# Patient Record
Sex: Female | Born: 1945 | Race: White | Hispanic: No | Smoking: Never smoker
Health system: Southern US, Community
[De-identification: ages and names within clinical notes are randomized; demographics above are authoritative.]

## PROBLEM LIST (undated history)

## (undated) DIAGNOSIS — R42 Dizziness and giddiness: Secondary | ICD-10-CM

## (undated) DIAGNOSIS — I2510 Atherosclerotic heart disease of native coronary artery without angina pectoris: Secondary | ICD-10-CM

## (undated) DIAGNOSIS — I10 Essential (primary) hypertension: Secondary | ICD-10-CM

## (undated) DIAGNOSIS — M1990 Unspecified osteoarthritis, unspecified site: Secondary | ICD-10-CM

## (undated) DIAGNOSIS — Z8619 Personal history of other infectious and parasitic diseases: Secondary | ICD-10-CM

## (undated) DIAGNOSIS — I2699 Other pulmonary embolism without acute cor pulmonale: Secondary | ICD-10-CM

## (undated) DIAGNOSIS — Z86718 Personal history of other venous thrombosis and embolism: Secondary | ICD-10-CM

## (undated) DIAGNOSIS — N809 Endometriosis, unspecified: Secondary | ICD-10-CM

## (undated) DIAGNOSIS — Z87442 Personal history of urinary calculi: Secondary | ICD-10-CM

## (undated) DIAGNOSIS — K5730 Diverticulosis of large intestine without perforation or abscess without bleeding: Secondary | ICD-10-CM

## (undated) DIAGNOSIS — T883XXA Malignant hyperthermia due to anesthesia, initial encounter: Secondary | ICD-10-CM

## (undated) DIAGNOSIS — R55 Syncope and collapse: Secondary | ICD-10-CM

## (undated) DIAGNOSIS — Q249 Congenital malformation of heart, unspecified: Secondary | ICD-10-CM

## (undated) DIAGNOSIS — I219 Acute myocardial infarction, unspecified: Secondary | ICD-10-CM

## (undated) DIAGNOSIS — E7800 Pure hypercholesterolemia, unspecified: Secondary | ICD-10-CM

## (undated) DIAGNOSIS — D6852 Prothrombin gene mutation: Secondary | ICD-10-CM

## (undated) DIAGNOSIS — N200 Calculus of kidney: Secondary | ICD-10-CM

## (undated) DIAGNOSIS — D649 Anemia, unspecified: Secondary | ICD-10-CM

## (undated) DIAGNOSIS — H269 Unspecified cataract: Secondary | ICD-10-CM

## (undated) HISTORY — PX: ABDOMINAL HYSTERECTOMY: SHX81

## (undated) HISTORY — PX: FOOT SURGERY: SHX648

## (undated) HISTORY — DX: Prothrombin gene mutation: D68.52

## (undated) HISTORY — DX: Unspecified cataract: H26.9

## (undated) HISTORY — DX: Calculus of kidney: N20.0

## (undated) HISTORY — PX: APPENDECTOMY: SHX54

## (undated) HISTORY — DX: Personal history of other venous thrombosis and embolism: Z86.718

## (undated) HISTORY — PX: CORONARY ANGIOPLASTY WITH STENT PLACEMENT: SHX49

## (undated) HISTORY — PX: CHOLECYSTECTOMY: SHX55

## (undated) HISTORY — DX: Endometriosis, unspecified: N80.9

---
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
## 1988-04-28 DIAGNOSIS — R42 Dizziness and giddiness: Secondary | ICD-10-CM

## 1988-04-28 HISTORY — DX: Dizziness and giddiness: R42

## 2003-04-29 HISTORY — PX: CARDIAC CATHETERIZATION: SHX172

## 2004-03-07 ENCOUNTER — Ambulatory Visit: Payer: Self-pay | Admitting: Gastroenterology

## 2004-12-18 ENCOUNTER — Ambulatory Visit: Payer: Self-pay | Admitting: Obstetrics and Gynecology

## 2005-02-26 ENCOUNTER — Ambulatory Visit: Payer: Self-pay | Admitting: General Practice

## 2005-07-11 ENCOUNTER — Observation Stay: Payer: Self-pay | Admitting: Internal Medicine

## 2005-07-11 ENCOUNTER — Other Ambulatory Visit: Payer: Self-pay

## 2005-12-22 ENCOUNTER — Ambulatory Visit: Payer: Self-pay | Admitting: Obstetrics and Gynecology

## 2006-12-24 ENCOUNTER — Ambulatory Visit: Payer: Self-pay | Admitting: Obstetrics and Gynecology

## 2006-12-30 ENCOUNTER — Other Ambulatory Visit: Payer: Self-pay

## 2006-12-30 ENCOUNTER — Ambulatory Visit: Payer: Self-pay

## 2007-12-27 ENCOUNTER — Ambulatory Visit: Payer: Self-pay | Admitting: Obstetrics and Gynecology

## 2008-08-23 ENCOUNTER — Ambulatory Visit: Payer: Self-pay | Admitting: Urology

## 2008-12-28 ENCOUNTER — Ambulatory Visit: Payer: Self-pay | Admitting: Obstetrics and Gynecology

## 2009-10-24 ENCOUNTER — Ambulatory Visit: Payer: Self-pay | Admitting: Orthopedic Surgery

## 2010-01-11 ENCOUNTER — Ambulatory Visit: Payer: Self-pay | Admitting: Obstetrics and Gynecology

## 2011-01-16 ENCOUNTER — Ambulatory Visit: Payer: Self-pay | Admitting: Obstetrics and Gynecology

## 2011-01-30 ENCOUNTER — Emergency Department: Payer: Self-pay | Admitting: Internal Medicine

## 2012-02-04 ENCOUNTER — Ambulatory Visit: Payer: Self-pay | Admitting: Obstetrics and Gynecology

## 2012-02-05 ENCOUNTER — Ambulatory Visit: Payer: Self-pay | Admitting: Obstetrics and Gynecology

## 2013-02-07 ENCOUNTER — Ambulatory Visit: Payer: Self-pay | Admitting: Obstetrics and Gynecology

## 2013-07-21 DIAGNOSIS — M722 Plantar fascial fibromatosis: Secondary | ICD-10-CM | POA: Insufficient documentation

## 2013-07-21 DIAGNOSIS — M67919 Unspecified disorder of synovium and tendon, unspecified shoulder: Secondary | ICD-10-CM | POA: Insufficient documentation

## 2013-07-21 DIAGNOSIS — E7800 Pure hypercholesterolemia, unspecified: Secondary | ICD-10-CM | POA: Insufficient documentation

## 2013-07-21 DIAGNOSIS — I10 Essential (primary) hypertension: Secondary | ICD-10-CM | POA: Insufficient documentation

## 2013-07-21 DIAGNOSIS — M75120 Complete rotator cuff tear or rupture of unspecified shoulder, not specified as traumatic: Secondary | ICD-10-CM | POA: Insufficient documentation

## 2013-07-21 DIAGNOSIS — M76899 Other specified enthesopathies of unspecified lower limb, excluding foot: Secondary | ICD-10-CM | POA: Insufficient documentation

## 2013-12-22 DIAGNOSIS — I2510 Atherosclerotic heart disease of native coronary artery without angina pectoris: Secondary | ICD-10-CM | POA: Insufficient documentation

## 2014-02-23 ENCOUNTER — Ambulatory Visit: Payer: Self-pay | Admitting: Obstetrics and Gynecology

## 2014-03-06 ENCOUNTER — Ambulatory Visit: Payer: Self-pay | Admitting: Gastroenterology

## 2014-04-28 DIAGNOSIS — I2699 Other pulmonary embolism without acute cor pulmonale: Secondary | ICD-10-CM

## 2014-04-28 HISTORY — DX: Other pulmonary embolism without acute cor pulmonale: I26.99

## 2014-07-03 DIAGNOSIS — M129 Arthropathy, unspecified: Secondary | ICD-10-CM | POA: Insufficient documentation

## 2015-01-30 ENCOUNTER — Other Ambulatory Visit: Payer: Self-pay | Admitting: Obstetrics and Gynecology

## 2015-01-30 DIAGNOSIS — Z1231 Encounter for screening mammogram for malignant neoplasm of breast: Secondary | ICD-10-CM

## 2015-03-01 ENCOUNTER — Ambulatory Visit
Admission: RE | Admit: 2015-03-01 | Discharge: 2015-03-01 | Disposition: A | Discharge status: Home / Self Care | Payer: Medicare Other | Source: Ambulatory Visit | Attending: Obstetrics and Gynecology | Admitting: Obstetrics and Gynecology

## 2015-03-01 ENCOUNTER — Other Ambulatory Visit: Payer: Self-pay | Admitting: Obstetrics and Gynecology

## 2015-03-01 DIAGNOSIS — Z1231 Encounter for screening mammogram for malignant neoplasm of breast: Secondary | ICD-10-CM

## 2015-04-11 ENCOUNTER — Emergency Department
Admission: EM | Admit: 2015-04-11 | Discharge: 2015-04-11 | Disposition: A | Discharge status: Home / Self Care | Payer: Medicare Other | Attending: Emergency Medicine | Admitting: Emergency Medicine

## 2015-04-11 DIAGNOSIS — N3090 Cystitis, unspecified without hematuria: Secondary | ICD-10-CM | POA: Insufficient documentation

## 2015-04-11 DIAGNOSIS — R112 Nausea with vomiting, unspecified: Secondary | ICD-10-CM | POA: Diagnosis present

## 2015-04-11 DIAGNOSIS — K529 Noninfective gastroenteritis and colitis, unspecified: Secondary | ICD-10-CM | POA: Diagnosis not present

## 2015-04-11 HISTORY — DX: Congenital malformation of heart, unspecified: Q24.9

## 2015-04-11 HISTORY — DX: Diverticulosis of large intestine without perforation or abscess without bleeding: K57.30

## 2015-04-11 LAB — URINALYSIS COMPLETE WITH MICROSCOPIC (ARMC ONLY)
Bilirubin Urine: NEGATIVE
GLUCOSE, UA: NEGATIVE mg/dL
KETONES UR: NEGATIVE mg/dL
NITRITE: NEGATIVE
Protein, ur: 100 mg/dL — AB
SPECIFIC GRAVITY, URINE: 1.013 (ref 1.005–1.030)
pH: 6 (ref 5.0–8.0)

## 2015-04-11 LAB — COMPREHENSIVE METABOLIC PANEL
ALK PHOS: 72 U/L (ref 38–126)
ALT: 20 U/L (ref 14–54)
ANION GAP: 13 (ref 5–15)
AST: 22 U/L (ref 15–41)
Albumin: 3.9 g/dL (ref 3.5–5.0)
BILIRUBIN TOTAL: 1.1 mg/dL (ref 0.3–1.2)
BUN: 24 mg/dL — ABNORMAL HIGH (ref 6–20)
CALCIUM: 9.3 mg/dL (ref 8.9–10.3)
CO2: 23 mmol/L (ref 22–32)
Chloride: 99 mmol/L — ABNORMAL LOW (ref 101–111)
Creatinine, Ser: 1.31 mg/dL — ABNORMAL HIGH (ref 0.44–1.00)
GFR calc Af Amer: 47 mL/min — ABNORMAL LOW (ref >60)
GFR, EST NON AFRICAN AMERICAN: 40 mL/min — AB (ref >60)
Glucose, Bld: 142 mg/dL — ABNORMAL HIGH (ref 65–99)
POTASSIUM: 3.1 mmol/L — AB (ref 3.5–5.1)
Sodium: 135 mmol/L (ref 135–145)
TOTAL PROTEIN: 8.4 g/dL — AB (ref 6.5–8.1)

## 2015-04-11 LAB — CBC
HEMATOCRIT: 38.3 % (ref 35.0–47.0)
HEMOGLOBIN: 12.6 g/dL (ref 12.0–16.0)
MCH: 29.6 pg (ref 26.0–34.0)
MCHC: 32.9 g/dL (ref 32.0–36.0)
MCV: 90 fL (ref 80.0–100.0)
Platelets: 245 10*3/uL (ref 150–440)
RBC: 4.26 MIL/uL (ref 3.80–5.20)
RDW: 13.1 % (ref 11.5–14.5)
WBC: 11 10*3/uL (ref 3.6–11.0)

## 2015-04-11 LAB — LIPASE, BLOOD: Lipase: 21 U/L (ref 11–51)

## 2015-04-11 MED ORDER — PROMETHAZINE HCL 25 MG PO TABS: 25.0000 mg | ORAL_TABLET | Freq: Four times a day (QID) | ORAL | Status: DC | PRN

## 2015-04-11 MED ORDER — ACETAMINOPHEN 500 MG PO TABS
1000.0000 mg | ORAL_TABLET | Freq: Once | ORAL | Status: AC
  Administered 2015-04-11: 1000 mg via ORAL
  Filled 2015-04-11: qty 2

## 2015-04-11 MED ORDER — ONDANSETRON 4 MG PO TBDP
ORAL_TABLET | ORAL | Status: AC
  Filled 2015-04-11: qty 1

## 2015-04-11 MED ORDER — SODIUM CHLORIDE 0.9 % IV SOLN
Freq: Once | INTRAVENOUS | Status: AC
  Administered 2015-04-11: 13:00:00 via INTRAVENOUS

## 2015-04-11 MED ORDER — PROMETHAZINE HCL 25 MG/ML IJ SOLN
12.5000 mg | Freq: Once | INTRAMUSCULAR | Status: AC
  Administered 2015-04-11: 12.5 mg via INTRAVENOUS

## 2015-04-11 MED ORDER — PROMETHAZINE HCL 25 MG/ML IJ SOLN
INTRAMUSCULAR | Status: AC
  Administered 2015-04-11: 12.5 mg via INTRAVENOUS
  Filled 2015-04-11: qty 1

## 2015-04-11 MED ORDER — ONDANSETRON HCL 4 MG/2ML IJ SOLN
4.0000 mg | Freq: Once | INTRAMUSCULAR | Status: AC
  Administered 2015-04-11: 4 mg via INTRAVENOUS
  Filled 2015-04-11: qty 2

## 2015-04-11 MED ORDER — DEXTROSE 5 % IV SOLN
1.0000 g | Freq: Once | INTRAVENOUS | Status: AC
  Administered 2015-04-11: 1 g via INTRAVENOUS
  Filled 2015-04-11: qty 10

## 2015-04-11 MED ORDER — SULFAMETHOXAZOLE-TRIMETHOPRIM 800-160 MG PO TABS: 1.0000 | ORAL_TABLET | Freq: Two times a day (BID) | ORAL | Status: DC

## 2015-04-11 MED ORDER — ONDANSETRON 4 MG PO TBDP
4.0000 mg | ORAL_TABLET | Freq: Once | ORAL | Status: AC | PRN
  Administered 2015-04-11: 4 mg via ORAL

## 2015-04-11 NOTE — Discharge Instructions (Signed)
Urinary Tract Infection A urinary tract infection (UTI) can occur any place along the urinary tract. The tract includes the kidneys, ureters, bladder, and urethra. A type of germ called bacteria often causes a UTI. UTIs are often helped with antibiotic medicine.  HOME CARE   If given, take antibiotics as told by your doctor. Finish them even if you start to feel better.  Drink enough fluids to keep your pee (urine) clear or pale yellow.  Avoid tea, drinks with caffeine, and bubbly (carbonated) drinks.  Pee often. Avoid holding your pee in for a long time.  Pee before and after having sex (intercourse).  Wipe from front to back after you poop (bowel movement) if you are a woman. Use each tissue only once. GET HELP RIGHT AWAY IF:   You have back pain.  You have lower belly (abdominal) pain.  You have chills.  You feel sick to your stomach (nauseous).  You throw up (vomit).  Your burning or discomfort with peeing does not go away.  You have a fever.  Your symptoms are not better in 3 days. MAKE SURE YOU:   Understand these instructions.  Will watch your condition.  Will get help right away if you are not doing well or get worse.   This information is not intended to replace advice given to you by your health care provider. Make sure you discuss any questions you have with your health care provider.   Document Released: 10/01/2007 Document Revised: 05/05/2014 Document Reviewed: 11/13/2011 Elsevier Interactive Patient Education 2016 [employer]. Norovirus Infection A norovirus infection is caused by exposure to a virus in a group of similar viruses (noroviruses). This type of infection causes inflammation in your stomach and intestines (gastroenteritis). Norovirus is the most common cause of gastroenteritis. It also causes food poisoning. Anyone can get a norovirus infection. It spreads very easily (contagious). You can get it from contaminated food, water, surfaces, or  other people. Norovirus is found in the stool or vomit of infected people. You can spread the infection as soon as you feel sick until 2 weeks after you recover.  Symptoms usually begin within 2 days after you become infected. Most norovirus symptoms affect the digestive system. CAUSES Norovirus infection is caused by contact with norovirus. You can catch norovirus if you:  Eat or drink something contaminated with norovirus.  Touch surfaces or objects contaminated with norovirus and then put your hand in your mouth.  Have direct contact with an infected person who has symptoms.  Share food, drink, or utensils with someone with who is sick with norovirus. SIGNS AND SYMPTOMS Symptoms of norovirus may include:  Nausea.  Vomiting.  Diarrhea.  Stomach cramps.  Fever.  Chills.  Headache.  Muscle aches.  Tiredness. DIAGNOSIS Your health care provider may suspect norovirus based on your symptoms and physical exam. Your health care provider may also test a sample of your stool or vomit for the virus.  TREATMENT There is no specific treatment for norovirus. Most people get better without treatment in about 2 days. HOME CARE INSTRUCTIONS  Replace lost fluids by drinking plenty of water or rehydration fluids containing important minerals called electrolytes. This prevents dehydration. Drink enough fluid to keep your urine clear or pale yellow.  Do not prepare food for others while you are infected. Wait at least 3 days after recovering from the illness to do that. PREVENTION   Wash your hands often, especially after using the toilet or changing a diaper.  Wash fruits  and vegetables thoroughly before preparing or serving them.  Throw out any food that a sick person may have touched.  Disinfect contaminated surfaces immediately after someone in the household has been sick. Use a bleach-based household cleaner.  Immediately remove and wash soiled clothes or sheets. SEEK MEDICAL  CARE IF:  Your vomiting, diarrhea, and stomach pain is getting worse.  Your symptoms of norovirus do not go away after 2-3 days. SEEK IMMEDIATE MEDICAL CARE IF:  You develop symptoms of dehydration that do not improve with fluid replacement. This may include:  Excessive sleepiness.  Lack of tears.  Dry mouth.  Dizziness when standing.  Weak pulse.   This information is not intended to replace advice given to you by your health care provider. Make sure you discuss any questions you have with your health care provider.   Document Released: 07/05/2002 Document Revised: 05/05/2014 Document Reviewed: 09/22/2013 Elsevier Interactive Patient Education [employer].

## 2015-04-11 NOTE — ED Provider Notes (Signed)
[facility] Emergency Department Provider Note     Time seen: ----------------------------------------- 12:50 PM on 04/11/2015 -----------------------------------------    I have reviewed the triage vital signs and the nursing notes.   HISTORY  Chief Complaint Nausea; Emesis; and Diarrhea    HPI April [name] is a 69 y.o. female who presents ER for nausea vomiting since Monday with diarrhea started today. She reports history of diverticulitis although she doesn't have any abdominal pain. Patient states she's not been around anyone his been sick that she was realized, denies any chest pain or difficulty breathing. Patient has not had a long history of GI illness. She denies other complaints currently.   Past Medical History  Diagnosis Date  . Diverticula of colon   . Cardiac abnormality     STENT    There are no active problems to display for this patient.   History reviewed. No pertinent past surgical history.  Allergies Antihistamines, chlorpheniramine-type; Dristan cold; and Ginger  Social History Social History  Substance Use Topics  . Smoking status: Never Smoker   . Smokeless tobacco: None  . Alcohol Use: None    Review of Systems Constitutional: Negative for fever. Eyes: Negative for visual changes. ENT: Negative for sore throat. Cardiovascular: Negative for chest pain. Respiratory: Negative for shortness of breath. Gastrointestinal: Positive for abdominal pain, vomiting and diarrhea. Genitourinary: Negative for dysuria. Musculoskeletal: Negative for back pain. Skin: Negative for rash. Neurological: Negative for headaches, positive for weakness  10-point ROS otherwise negative.  ____________________________________________   PHYSICAL EXAM:  VITAL SIGNS: ED Triage Vitals  Enc Vitals Group     BP 04/11/15 1147 144/65 mmHg     Pulse Rate 04/11/15 1147 98     Resp 04/11/15 1147 18     Temp 04/11/15 1147 101.7 F  (38.7 C)     Temp Source 04/11/15 1147 Oral     SpO2 04/11/15 1147 100 %     Weight 04/11/15 1147 185 lb (83.915 kg)     Height 04/11/15 1147 5\' 7"  (1.702 m)     Head Cir --      Peak Flow --      Pain Score --      Pain Loc --      Pain Edu? --      Excl. in [place]? --     Constitutional: Alert and oriented. Mild distress Eyes: Conjunctivae are normal. PERRL. Normal extraocular movements. ENT   Head: Normocephalic and atraumatic.   Nose: No congestion/rhinnorhea.   Mouth/Throat: Mucous membranes are moist.   Neck: No stridor. Cardiovascular: Normal rate, regular rhythm. Normal and symmetric distal pulses are present in all extremities. No murmurs, rubs, or gallops. Respiratory: Normal respiratory effort without tachypnea nor retractions. Breath sounds are clear and equal bilaterally. No wheezes/rales/rhonchi. Gastrointestinal: Soft and nontender. No distention. No abdominal bruits. No bowel sounds Musculoskeletal: Nontender with normal range of motion in all extremities. No joint effusions.  No lower extremity tenderness nor edema. Neurologic:  Normal speech and language. No gross focal neurologic deficits are appreciated. Speech is normal. No gait instability. Skin:  Skin is warm, dry and intact. No rash noted. Psychiatric: Mood and affect are normal. Speech and behavior are normal. Patient exhibits appropriate insight and judgment. ____________________________________________  ED COURSE:  Pertinent labs & imaging results that were available during my care of the patient were reviewed by me and considered in my medical decision making (see chart for details). She is in no acute distress, will  check basic labs, give IV fluids and antiemetics. ____________________________________________    LABS (pertinent positives/negatives)  Labs Reviewed  COMPREHENSIVE METABOLIC PANEL - Abnormal; Notable for the following:    Potassium 3.1 (*)    Chloride 99 (*)    Glucose, Bld  142 (*)    BUN 24 (*)    Creatinine, Ser 1.31 (*)    Total Protein 8.4 (*)    GFR calc non Af Amer 40 (*)    GFR calc Af Amer 47 (*)    All other components within normal limits  URINALYSIS COMPLETEWITH MICROSCOPIC ([facility] ONLY) - Abnormal; Notable for the following:    Color, Urine YELLOW (*)    APPearance HAZY (*)    Hgb urine dipstick 2+ (*)    Protein, ur 100 (*)    Leukocytes, UA 1+ (*)    Bacteria, UA RARE (*)    Squamous Epithelial / LPF 0-5 (*)    All other components within normal limits  LIPASE, BLOOD  CBC   ____________________________________________  FINAL ASSESSMENT AND PLAN  Gastroenteritis, cystitis  Plan: Patient with labs and imaging as dictated above. Patient has received a liter of saline as well as several doses of IV nausea medication. She has normal white blood cell count, has received IV Rocephin to treat her UTI. I did offer her admission in that she has required several doses of antiemetics, she states she wants to go home. I have advised if she gets worse she can return and be admitted. She'll be discharged with Septra DS for her urine and Phenergan to take for nausea.   Earleen Newport, MD   Earleen Newport, MD 04/11/15 [phone]

## 2015-04-11 NOTE — ED Notes (Signed)
Pt states nausea and vomitng since Monday with diaherra today, states hx of diverticulitis, pt denies any pain or being around anyone sick

## 2015-04-14 ENCOUNTER — Emergency Department: Payer: Medicare Other

## 2015-04-14 ENCOUNTER — Observation Stay
Admission: EM | Admit: 2015-04-14 | Discharge: 2015-04-16 | Disposition: A | Discharge status: Home / Self Care | Payer: Medicare Other | Attending: Internal Medicine | Admitting: Internal Medicine

## 2015-04-14 ENCOUNTER — Encounter: Payer: Self-pay | Admitting: *Deleted

## 2015-04-14 DIAGNOSIS — E876 Hypokalemia: Secondary | ICD-10-CM | POA: Insufficient documentation

## 2015-04-14 DIAGNOSIS — M7989 Other specified soft tissue disorders: Secondary | ICD-10-CM | POA: Insufficient documentation

## 2015-04-14 DIAGNOSIS — I10 Essential (primary) hypertension: Secondary | ICD-10-CM | POA: Insufficient documentation

## 2015-04-14 DIAGNOSIS — I2699 Other pulmonary embolism without acute cor pulmonale: Principal | ICD-10-CM | POA: Insufficient documentation

## 2015-04-14 DIAGNOSIS — R918 Other nonspecific abnormal finding of lung field: Secondary | ICD-10-CM | POA: Diagnosis not present

## 2015-04-14 DIAGNOSIS — Z86711 Personal history of pulmonary embolism: Secondary | ICD-10-CM | POA: Diagnosis present

## 2015-04-14 DIAGNOSIS — Z79899 Other long term (current) drug therapy: Secondary | ICD-10-CM | POA: Insufficient documentation

## 2015-04-14 DIAGNOSIS — Z7982 Long term (current) use of aspirin: Secondary | ICD-10-CM | POA: Diagnosis not present

## 2015-04-14 DIAGNOSIS — I160 Hypertensive urgency: Secondary | ICD-10-CM | POA: Diagnosis not present

## 2015-04-14 DIAGNOSIS — I2510 Atherosclerotic heart disease of native coronary artery without angina pectoris: Secondary | ICD-10-CM | POA: Insufficient documentation

## 2015-04-14 DIAGNOSIS — Z888 Allergy status to other drugs, medicaments and biological substances status: Secondary | ICD-10-CM | POA: Diagnosis not present

## 2015-04-14 DIAGNOSIS — Z7901 Long term (current) use of anticoagulants: Secondary | ICD-10-CM | POA: Insufficient documentation

## 2015-04-14 DIAGNOSIS — Z881 Allergy status to other antibiotic agents status: Secondary | ICD-10-CM | POA: Diagnosis not present

## 2015-04-14 DIAGNOSIS — Z955 Presence of coronary angioplasty implant and graft: Secondary | ICD-10-CM | POA: Insufficient documentation

## 2015-04-14 DIAGNOSIS — I252 Old myocardial infarction: Secondary | ICD-10-CM | POA: Diagnosis not present

## 2015-04-14 DIAGNOSIS — R0602 Shortness of breath: Secondary | ICD-10-CM | POA: Insufficient documentation

## 2015-04-14 DIAGNOSIS — R0781 Pleurodynia: Secondary | ICD-10-CM | POA: Diagnosis present

## 2015-04-14 DIAGNOSIS — Z8249 Family history of ischemic heart disease and other diseases of the circulatory system: Secondary | ICD-10-CM | POA: Diagnosis not present

## 2015-04-14 DIAGNOSIS — J9811 Atelectasis: Secondary | ICD-10-CM | POA: Insufficient documentation

## 2015-04-14 DIAGNOSIS — I517 Cardiomegaly: Secondary | ICD-10-CM | POA: Diagnosis not present

## 2015-04-14 DIAGNOSIS — D649 Anemia, unspecified: Secondary | ICD-10-CM | POA: Diagnosis not present

## 2015-04-14 DIAGNOSIS — R091 Pleurisy: Secondary | ICD-10-CM | POA: Insufficient documentation

## 2015-04-14 DIAGNOSIS — R609 Edema, unspecified: Secondary | ICD-10-CM

## 2015-04-14 DIAGNOSIS — A0811 Acute gastroenteropathy due to Norwalk agent: Secondary | ICD-10-CM | POA: Insufficient documentation

## 2015-04-14 LAB — COMPREHENSIVE METABOLIC PANEL
ALT: 91 U/L — AB (ref 14–54)
AST: 51 U/L — ABNORMAL HIGH (ref 15–41)
Albumin: 3.3 g/dL — ABNORMAL LOW (ref 3.5–5.0)
Alkaline Phosphatase: 73 U/L (ref 38–126)
Anion gap: 9 (ref 5–15)
BUN: 23 mg/dL — ABNORMAL HIGH (ref 6–20)
CHLORIDE: 103 mmol/L (ref 101–111)
CO2: 25 mmol/L (ref 22–32)
Calcium: 9.1 mg/dL (ref 8.9–10.3)
Creatinine, Ser: 1.19 mg/dL — ABNORMAL HIGH (ref 0.44–1.00)
GFR, EST AFRICAN AMERICAN: 52 mL/min — AB (ref >60)
GFR, EST NON AFRICAN AMERICAN: 45 mL/min — AB (ref >60)
Glucose, Bld: 105 mg/dL — ABNORMAL HIGH (ref 65–99)
POTASSIUM: 3.6 mmol/L (ref 3.5–5.1)
SODIUM: 137 mmol/L (ref 135–145)
Total Bilirubin: 1 mg/dL (ref 0.3–1.2)
Total Protein: 7.2 g/dL (ref 6.5–8.1)

## 2015-04-14 LAB — CBC
HCT: 35.7 % (ref 35.0–47.0)
HEMOGLOBIN: 11.9 g/dL — AB (ref 12.0–16.0)
MCH: 30.3 pg (ref 26.0–34.0)
MCHC: 33.5 g/dL (ref 32.0–36.0)
MCV: 90.4 fL (ref 80.0–100.0)
Platelets: 258 10*3/uL (ref 150–440)
RBC: 3.95 MIL/uL (ref 3.80–5.20)
RDW: 13.5 % (ref 11.5–14.5)
WBC: 8.2 10*3/uL (ref 3.6–11.0)

## 2015-04-14 LAB — TROPONIN I: Troponin I: <0.03 ng/mL (ref <0.031)

## 2015-04-14 LAB — FIBRIN DERIVATIVES D-DIMER (ARMC ONLY): FIBRIN DERIVATIVES D-DIMER (ARMC): 6487 — AB (ref 0–499)

## 2015-04-14 MED ORDER — OXYCODONE HCL 5 MG PO TABS
5.0000 mg | ORAL_TABLET | ORAL | Status: DC | PRN
Start: 2015-04-14 — End: 2015-04-16
  Administered 2015-04-15: 09:00:00 5 mg via ORAL
  Filled 2015-04-14: qty 1

## 2015-04-14 MED ORDER — ONDANSETRON HCL 4 MG/2ML IJ SOLN
4.0000 mg | Freq: Four times a day (QID) | INTRAMUSCULAR | Status: DC | PRN
  Administered 2015-04-15: 13:00:00 4 mg via INTRAVENOUS
  Filled 2015-04-14: qty 2

## 2015-04-14 MED ORDER — ACETAMINOPHEN 650 MG RE SUPP: 650.0000 mg | Freq: Four times a day (QID) | RECTAL | Status: DC | PRN

## 2015-04-14 MED ORDER — ENOXAPARIN SODIUM 100 MG/ML ~~LOC~~ SOLN
1.0000 mg/kg | SUBCUTANEOUS | Status: AC
  Administered 2015-04-15: 85 mg via SUBCUTANEOUS
  Filled 2015-04-14: qty 1

## 2015-04-14 MED ORDER — MORPHINE SULFATE (PF) 2 MG/ML IV SOLN
2.0000 mg | Freq: Once | INTRAVENOUS | Status: AC
  Administered 2015-04-15: 2 mg via INTRAVENOUS

## 2015-04-14 MED ORDER — IOHEXOL 350 MG/ML SOLN
80.0000 mL | Freq: Once | INTRAVENOUS | Status: AC | PRN
  Administered 2015-04-14: 80 mL via INTRAVENOUS

## 2015-04-14 MED ORDER — ONDANSETRON HCL 4 MG PO TABS: 4.0000 mg | ORAL_TABLET | Freq: Four times a day (QID) | ORAL | Status: DC | PRN

## 2015-04-14 MED ORDER — MORPHINE SULFATE (PF) 4 MG/ML IV SOLN
4.0000 mg | Freq: Once | INTRAVENOUS | Status: AC
  Administered 2015-04-14: 4 mg via INTRAVENOUS
  Filled 2015-04-14: qty 1

## 2015-04-14 MED ORDER — ACETAMINOPHEN 325 MG PO TABS: 650.0000 mg | ORAL_TABLET | Freq: Four times a day (QID) | ORAL | Status: DC | PRN

## 2015-04-14 MED ORDER — SODIUM CHLORIDE 0.9 % IJ SOLN
3.0000 mL | Freq: Two times a day (BID) | INTRAMUSCULAR | Status: DC
  Administered 2015-04-15 (×3): 3 mL via INTRAVENOUS

## 2015-04-14 MED ORDER — MORPHINE SULFATE (PF) 2 MG/ML IV SOLN: 2.0000 mg | INTRAVENOUS | Status: DC | PRN

## 2015-04-14 MED ORDER — IOHEXOL 240 MG/ML SOLN
25.0000 mL | Freq: Once | INTRAMUSCULAR | Status: AC | PRN
  Administered 2015-04-14: 25 mL via ORAL

## 2015-04-14 NOTE — ED Notes (Signed)
Pt returned from CT °

## 2015-04-14 NOTE — ED Notes (Signed)
Pt presents w/ c/o R rib pain that is tender to palpation, inspiration and position change. Pt has hx of cardiac stenting. Pt diagnosed w/ norovirus x 1 week ago, no n/v/d since Wed.

## 2015-04-14 NOTE — ED Notes (Signed)
Patient was unable to take a deep breath during exam due to chest discomfort

## 2015-04-14 NOTE — ED Provider Notes (Signed)
[facility] Emergency Department Provider Note REMINDER - THIS NOTE IS NOT A FINAL MEDICAL RECORD UNTIL IT IS SIGNED. UNTIL THEN, THE CONTENT BELOW MAY REFLECT INFORMATION FROM A DOCUMENTATION TEMPLATE, NOT THE ACTUAL PATIENT VISIT. ____________________________________________  Time seen: Approximately 9:20 PM  I have reviewed the triage vital signs and the nursing notes.   HISTORY  Chief Complaint Pleurisy    HPI April [name] is a 69 y.o. 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 She presents today with concerns of right-sided chest pain. She reports that since this morning she did having a sharp pain whenever she takes a deep breath in the right lateral ribs. She reports that she does not feel short of breath, but sharp severe pain takes her breath away when she tries to take a deep breath. No cough or productive cough. She does report she recently had the "normal virus" but this has improved, no longer having nausea and vomiting.  She denies fever, though she reports having a fever couple days ago which is gone away. No abdominal pain today. No pain in the back.Took 325 mg aspirin at home  Denies pain rating to the arm neck or jaw. No heavy chest pain or pressure. No left-sided chest pain. No injury.  Past Medical History  Diagnosis Date  . Diverticula of colon   . Cardiac abnormality     STENT    There are no active problems to display for this patient.   History reviewed. No pertinent past surgical history.  Current Outpatient Rx  Name  Route  Sig  Dispense  Refill  . promethazine (PHENERGAN) 25 MG tablet   Oral   Take 1 tablet (25 mg total) by mouth every 6 (six) hours as needed for nausea or vomiting.   20 tablet   0   . sulfamethoxazole-trimethoprim (BACTRIM DS) 800-160 MG tablet   Oral   Take 1 tablet by mouth 2 (two) times daily.   20 tablet   0     Allergies Antihistamines, chlorpheniramine-type; Dristan cold; and Ginger  History  reviewed. No pertinent family history.  Social History Social History  Substance Use Topics  . Smoking status: Never Smoker   . Smokeless tobacco: None  . Alcohol Use: No    Review of Systems Constitutional: No fever/chills Eyes: No visual changes. ENT: No sore throat. Cardiovascular: See history of present illness Respiratory: Denies shortness of breath. Gastrointestinal: No abdominal pain.  No nausea, no vomiting.  No diarrhea.  No constipation. Genitourinary: Negative for dysuria. Musculoskeletal: Negative for back pain. Skin: Negative for rash. Neurological: Negative for headaches, focal weakness or numbness.  10-point ROS otherwise negative.  No recent immobilization. No recent surgeries. No leg swelling. No history of blood clots. ____________________________________________   PHYSICAL EXAM:  VITAL SIGNS: ED Triage Vitals  Enc Vitals Group     BP 04/14/15 2022 186/67 mmHg     Pulse Rate 04/14/15 2022 63     Resp 04/14/15 2022 18     Temp 04/14/15 2022 99.2 F (37.3 C)     Temp Source 04/14/15 2022 Oral     SpO2 04/14/15 2005 97 %     Weight 04/14/15 2022 185 lb (83.915 kg)     Height 04/14/15 2022 5\' 7"  (1.702 m)     Head Cir --      Peak Flow --      Pain Score 04/14/15 2024 9     Pain Loc --      Pain  Edu? --      Excl. in [place]? --    Constitutional: Alert and oriented. Well appearing though appears in moderate pain especially when she takes a deep breath and in no acute distress. Eyes: Conjunctivae are normal. PERRL. EOMI. Head: Atraumatic. Nose: No congestion/rhinnorhea. Mouth/Throat: Mucous membranes are moist.  Oropharynx non-erythematous. Neck: No stridor.   Cardiovascular: Normal rate, regular rhythm. Grossly normal heart sounds.  Good peripheral circulation. Respiratory: Normal respiratory effort.  No retractions. Lungs CTAB. Patient does have focal tenderness along the right lateral rib cage to palpation. No lesion of the skin is  noted. Gastrointestinal: Soft and nontender. No distention. No abdominal bruits. No CVA tenderness. Musculoskeletal: No lower extremity tenderness nor edema.  No joint effusions. Neurologic:  Normal speech and language. No gross focal neurologic deficits are appreciated. Skin:  Skin is warm, dry and intact. No rash noted. Psychiatric: Mood and affect are normal. Speech and behavior are normal.  ____________________________________________   LABS (all labs ordered are listed, but only abnormal results are displayed)  Labs Reviewed  FIBRIN DERIVATIVES D-DIMER ([facility] ONLY) - Abnormal; Notable for the following:    Fibrin derivatives D-dimer [facility]) 6487 (*)    All other components within normal limits  CBC - Abnormal; Notable for the following:    Hemoglobin 11.9 (*)    All other components within normal limits  COMPREHENSIVE METABOLIC PANEL - Abnormal; Notable for the following:    Glucose, Bld 105 (*)    BUN 23 (*)    Creatinine, Ser 1.19 (*)    Albumin 3.3 (*)    AST 51 (*)    ALT 91 (*)    GFR calc non Af Amer 45 (*)    GFR calc Af Amer 52 (*)    All other components within normal limits  TROPONIN I   ____________________________________________  EKG  Reviewed and interpreted. 20/15 Normal sinus rhythm Heart rate 65 PR 160 QTc 4:30 Normal sinus rhythm, possible left ventricular hypertrophy, no evidence of ischemic T-wave abnormalities. Q waves seen in inferior lead  No evidence of acute ischemic abnormality. ____________________________________________  RADIOLOGY   IMPRESSION: 1. Acute bilateral pulmonary emboli involving the segmental branches small to moderate thromboembolic burden. No right heart strain. 2. Heterogeneous enhancement of the left kidney with mild perinephric stranding, raises concern for left pyelonephritis. Recommend correlation with urinalysis. Critical Value/emergent results were called by telephone at the time of interpretation on 04/14/2015  at 11:46 pm to Dr. Delman Kitten , who verbally acknowledged these results. ____________________________________________   PROCEDURES  Procedure(s) performed: None  Critical Care performed: No  ____________________________________________   INITIAL IMPRESSION / ASSESSMENT AND PLAN / ED COURSE  Pertinent labs & imaging results that were available during my care of the patient were reviewed by me and considered in my medical decision making (see chart for details).  Patient reports right-sided chest pain, very pleuritic in nature. Recent viral infection, raising my suspicion for the possibility of viral pleurisy however other considerations would certainly include no felt to be far less likely acute coronary syndrome, pulmonary embolism, pneumothorax, pleural effusion, pericarditis. No intra-abdominal pain. No tenderness in right upper quadrant.  ----------------------------------------- 11:54 PM on 04/14/2015 -----------------------------------------  CT indicates bilateral pulmonary emboli. Patient denies any history of bleeding disorder, history of hemorrhagic stroke, bloody stools or ulcers. We'll initiate on Lovenox. Discussed with Dr. Lavetta Nielsen, we'll admit to hospital. She has bilateral pulmonary emboli, no evidence of right heart strain, negative troponin.  Updated patient family. Patient also notes  that she has a current urinary tract infection, currently on antibiotic treatment. ____________________________________________   FINAL CLINICAL IMPRESSION(S) / ED DIAGNOSES  Final diagnoses:  Pleuritic pain  Pulmonary embolism, bilateral (HCC)      Delman Kitten, MD 04/14/15 [id number]

## 2015-04-15 ENCOUNTER — Observation Stay: Payer: Medicare Other

## 2015-04-15 ENCOUNTER — Encounter: Payer: Self-pay | Admitting: Internal Medicine

## 2015-04-15 LAB — URINALYSIS COMPLETE WITH MICROSCOPIC (ARMC ONLY)
BACTERIA UA: NONE SEEN
BILIRUBIN URINE: NEGATIVE
Glucose, UA: NEGATIVE mg/dL
HGB URINE DIPSTICK: NEGATIVE
Ketones, ur: NEGATIVE mg/dL
LEUKOCYTES UA: NEGATIVE
Nitrite: NEGATIVE
PH: 5 (ref 5.0–8.0)
PROTEIN: NEGATIVE mg/dL
Specific Gravity, Urine: 1.056 — ABNORMAL HIGH (ref 1.005–1.030)

## 2015-04-15 LAB — CBC
HEMATOCRIT: 34.8 % — AB (ref 35.0–47.0)
HEMOGLOBIN: 11.5 g/dL — AB (ref 12.0–16.0)
MCH: 29.9 pg (ref 26.0–34.0)
MCHC: 33.1 g/dL (ref 32.0–36.0)
MCV: 90.1 fL (ref 80.0–100.0)
PLATELETS: 283 10*3/uL (ref 150–440)
RBC: 3.86 MIL/uL (ref 3.80–5.20)
RDW: 13.3 % (ref 11.5–14.5)
WBC: 7.4 10*3/uL (ref 3.6–11.0)

## 2015-04-15 LAB — BASIC METABOLIC PANEL
ANION GAP: 10 (ref 5–15)
BUN: 23 mg/dL — AB (ref 6–20)
CHLORIDE: 103 mmol/L (ref 101–111)
CO2: 26 mmol/L (ref 22–32)
Calcium: 9.1 mg/dL (ref 8.9–10.3)
Creatinine, Ser: 1.09 mg/dL — ABNORMAL HIGH (ref 0.44–1.00)
GFR calc Af Amer: 58 mL/min — ABNORMAL LOW (ref >60)
GFR calc non Af Amer: 50 mL/min — ABNORMAL LOW (ref >60)
GLUCOSE: 104 mg/dL — AB (ref 65–99)
POTASSIUM: 3.3 mmol/L — AB (ref 3.5–5.1)
Sodium: 139 mmol/L (ref 135–145)

## 2015-04-15 MED ORDER — CALCIUM CITRATE-VITAMIN D 500-400 MG-UNIT PO CHEW
1.0000 | CHEWABLE_TABLET | Freq: Every day | ORAL | Status: DC
  Administered 2015-04-15: 1 via ORAL
  Filled 2015-04-15 (×2): qty 1

## 2015-04-15 MED ORDER — POTASSIUM CHLORIDE CRYS ER 20 MEQ PO TBCR
40.0000 meq | EXTENDED_RELEASE_TABLET | Freq: Once | ORAL | Status: AC
  Administered 2015-04-15: 11:00:00 40 meq via ORAL
  Filled 2015-04-15: qty 2

## 2015-04-15 MED ORDER — APIXABAN 5 MG PO TABS
10.0000 mg | ORAL_TABLET | Freq: Two times a day (BID) | ORAL | Status: DC
  Administered 2015-04-15 – 2015-04-16 (×3): 10 mg via ORAL
  Filled 2015-04-15 (×3): qty 2

## 2015-04-15 MED ORDER — ATENOLOL 25 MG PO TABS
25.0000 mg | ORAL_TABLET | Freq: Two times a day (BID) | ORAL | Status: DC
  Administered 2015-04-15 – 2015-04-16 (×3): 25 mg via ORAL
  Filled 2015-04-15 (×4): qty 1

## 2015-04-15 MED ORDER — MORPHINE SULFATE (PF) 2 MG/ML IV SOLN
INTRAVENOUS | Status: AC
  Administered 2015-04-15: 2 mg via INTRAVENOUS
  Filled 2015-04-15: qty 1

## 2015-04-15 MED ORDER — APIXABAN 5 MG PO TABS: 5.0000 mg | ORAL_TABLET | Freq: Two times a day (BID) | ORAL | Status: DC

## 2015-04-15 MED ORDER — HYDRALAZINE HCL 20 MG/ML IJ SOLN: 10.0000 mg | Freq: Four times a day (QID) | INTRAMUSCULAR | Status: DC | PRN

## 2015-04-15 MED ORDER — SODIUM CHLORIDE 0.9 % IV SOLN
INTRAVENOUS | Status: AC
  Administered 2015-04-15: 11:00:00 via INTRAVENOUS

## 2015-04-15 MED ORDER — SULFAMETHOXAZOLE-TRIMETHOPRIM 800-160 MG PO TABS
1.0000 | ORAL_TABLET | Freq: Two times a day (BID) | ORAL | Status: DC
  Administered 2015-04-15 – 2015-04-16 (×3): 1 via ORAL
  Filled 2015-04-15 (×3): qty 1

## 2015-04-15 MED ORDER — IPRATROPIUM-ALBUTEROL 0.5-2.5 (3) MG/3ML IN SOLN: 3.0000 mL | RESPIRATORY_TRACT | Status: DC | PRN

## 2015-04-15 MED ORDER — ATORVASTATIN CALCIUM 10 MG PO TABS
10.0000 mg | ORAL_TABLET | Freq: Every day | ORAL | Status: DC
  Administered 2015-04-15: 10 mg via ORAL
  Filled 2015-04-15: qty 1

## 2015-04-15 NOTE — Care Management Obs Status (Signed)
[facility] NOTIFICATION   Patient Details  Name: April [name] MRN: [id number] Date of Birth: 02/09/1946   Medicare Observation Status Notification Given:  Yes    Ival Bible, RN 04/15/2015, 9:28 AM

## 2015-04-15 NOTE — H&P (Signed)
April [name] is an 69 y.o. female.   Chief Complaint: Pleuritic pain HPI: The patient presents to the emergency department complaining of pain with deep inspiration as well as pain with certain movements. She states that she's had a vague sensation of discomfort for a few days but that the pain has acutely worsened today. Family members report that she appeared flushed and she reports feeling sweaty prior to arrival. Notably she has been afebrile. No hypoxia since arrival to the emergency department. She denies chest pain at rest or with normal inspirations as well as denying nausea, vomiting or frank diaphoresis. CT angiogram of the chest revealed bilateral pulmonary emboli. The patient reports falling 3 months ago which has limited her exercise (although she still walks approximately 2 miles per week) and has changed her gait. The patient reports feeling somewhat unsteady on her feet as well. These factors promptly the emergency department to call for admission.  Past Medical History  Diagnosis Date  . Diverticula of colon   . Cardiac abnormality     STENT    Past Surgical History  Procedure Laterality Date  . Cardiac catheterization      Family History  Problem Relation Age of Onset  . Stroke Mother   . CAD Father    Social History:  reports that she has never smoked. She does not have any smokeless tobacco history on file. She reports that she does not drink alcohol or use illicit drugs.  Allergies:  Allergies  Allergen Reactions  . Antihistamines, Chlorpheniramine-Type   . Dristan Cold [Chlorphen-Pe-Acetaminophen]   . Ginger     Prior to Admission medications   Medication Sig Start Date End Date Taking? Authorizing Provider  promethazine (PHENERGAN) 25 MG tablet Take 1 tablet (25 mg total) by mouth every 6 (six) hours as needed for nausea or vomiting. 04/11/15   Earleen Newport, MD  sulfamethoxazole-trimethoprim (BACTRIM DS) 800-160 MG tablet Take 1 tablet by mouth  2 (two) times daily. 04/11/15   Earleen Newport, MD     Results for orders placed or performed during the hospital encounter of 04/14/15 (from the past 48 hour(s))  Fibrin derivatives D-Dimer [facility] only)     Status: Abnormal   Collection Time: 04/13/15  9:40 PM  Result Value Ref Range   Fibrin derivatives D-dimer ([facility]) 6487 (H) 0 - 499    Comment: <> Exclusion of Venous Thromboembolism (VTE) - OUTPATIENTS ONLY        (Emergency Department or [place])             0-499 ng/ml (FEU)  : With a low to intermediate pretest                                        probability for VTE this test result                                        excludes the diagnosis of VTE.           > 499 ng/ml (FEU)  : VTE not excluded.  Additional work up                                   for VTE is  required.   <>  Testing on Inpatients and Evaluation of Disseminated Intravascular        Coagulation (DIC)             Reference Range:   0-499 ng/ml (FEU)   Troponin I     Status: None   Collection Time: 04/14/15  8:38 PM  Result Value Ref Range   Troponin I <0.03 <0.031 ng/mL    Comment:        NO INDICATION OF MYOCARDIAL INJURY.   CBC     Status: Abnormal   Collection Time: 04/14/15  8:38 PM  Result Value Ref Range   WBC 8.2 3.6 - 11.0 K/uL   RBC 3.95 3.80 - 5.20 MIL/uL   Hemoglobin 11.9 (L) 12.0 - 16.0 g/dL   HCT 35.7 35.0 - 47.0 %   MCV 90.4 80.0 - 100.0 fL   MCH 30.3 26.0 - 34.0 pg   MCHC 33.5 32.0 - 36.0 g/dL   RDW 13.5 11.5 - 14.5 %   Platelets 258 150 - 440 K/uL  Comprehensive metabolic panel     Status: Abnormal   Collection Time: 04/14/15  8:38 PM  Result Value Ref Range   Sodium 137 135 - 145 mmol/L   Potassium 3.6 3.5 - 5.1 mmol/L   Chloride 103 101 - 111 mmol/L   CO2 25 22 - 32 mmol/L   Glucose, Bld 105 (H) 65 - 99 mg/dL   BUN 23 (H) 6 - 20 mg/dL   Creatinine, Ser 1.19 (H) 0.44 - 1.00 mg/dL   Calcium 9.1 8.9 - 10.3 mg/dL   Total Protein 7.2 6.5 - 8.1 g/dL   Albumin 3.3 (L)  3.5 - 5.0 g/dL   AST 51 (H) 15 - 41 U/L   ALT 91 (H) 14 - 54 U/L   Alkaline Phosphatase 73 38 - 126 U/L   Total Bilirubin 1.0 0.3 - 1.2 mg/dL   GFR calc non Af Amer 45 (L) >60 mL/min   GFR calc Af Amer 52 (L) >60 mL/min    Comment: (NOTE) The eGFR has been calculated using the CKD EPI equation. This calculation has not been validated in all clinical situations. eGFR's persistently <60 mL/min signify possible Chronic Kidney Disease.    Anion gap 9 5 - 15   Ct Angio Chest Pe W/cm &/or Wo Cm  04/14/2015  CLINICAL DATA:  Right-sided pleuritic chest pain. Recent diagnosis of noro virus. EXAM: CT ANGIOGRAPHY CHEST CT ABDOMEN AND PELVIS WITH CONTRAST TECHNIQUE: Multidetector CT imaging of the chest was performed using the standard protocol during bolus administration of intravenous contrast. Multiplanar CT image reconstructions and MIPs were obtained to evaluate the vascular anatomy. Multidetector CT imaging of the abdomen and pelvis was performed using the standard protocol during bolus administration of intravenous contrast. CONTRAST:  26m OMNIPAQUE IOHEXOL 350 MG/ML SOLN COMPARISON:  Chest radiograph earlier this day. Noncontrast CT abdomen 01/31/2011 FINDINGS: CTA CHEST FINDINGS Acute bilateral pulmonary emboli. On the right this involves the segmental branches of the upper and lower lobes, greatest thromboembolic burden to the right lower lobe. On the left this involves the segmental branches of the lower lobe, greatest thromboembolic burden anteriorly. There is no evidence of right heart strain, RV to LV ratio of 0.97. The heart upper limits normal in size. There are coronary artery calcifications. Thoracic aorta is normal in caliber. No pleural or pericardial effusion. No mediastinal or hilar adenopathy. Bilateral lower lobe atelectasis. No wedge-shaped opacity to suggest pulmonary infarct. Mild bronchial  thickening. There are no acute or suspicious osseous abnormalities. CT ABDOMEN and PELVIS  FINDINGS Postcholecystectomy with intra and extrahepatic biliary prominence. No focal hepatic lesion. The spleen and adrenal glands are unremarkable. Mild pancreatic ductal prominence of 3 mm without focal pancreatic mass or surrounding inflammation. Heterogeneous enhancement of the left kidney. Mild left perinephric stranding. Subcentimeter hypodensities in the lower left and interpolar right kidney, too small to characterize. Stomach physiologically distended. There are no dilated or thickened bowel loops. Small to moderate stool burden. Diverticulosis of the distal colon without diverticulitis. No free air, free fluid, or intra-abdominal fluid collection. Appendix not visualized. No retroperitoneal adenopathy. Abdominal aorta is normal in caliber. Moderate atherosclerosis without aneurysm. Within the pelvis the bladder is physiologically distended. Uterus is surgically absent. No adnexal mass. There are no acute or suspicious osseous abnormalities. There is degenerative change throughout spine. Review of the MIP images confirms the above findings. IMPRESSION: 1. Acute bilateral pulmonary emboli involving the segmental branches small to moderate thromboembolic burden. No right heart strain. 2. Heterogeneous enhancement of the left kidney with mild perinephric stranding, raises concern for left pyelonephritis. Recommend correlation with urinalysis. Critical Value/emergent results were called by telephone at the time of interpretation on 04/14/2015 at 11:46 pm to Dr. Delman Kitten , who verbally acknowledged these results. Electronically Signed   By: Jeb Levering M.D.   On: 04/14/2015 23:47   Ct Abdomen Pelvis W Contrast  04/14/2015  CLINICAL DATA:  Right-sided pleuritic chest pain. Recent diagnosis of noro virus. EXAM: CT ANGIOGRAPHY CHEST CT ABDOMEN AND PELVIS WITH CONTRAST TECHNIQUE: Multidetector CT imaging of the chest was performed using the standard protocol during bolus administration of intravenous  contrast. Multiplanar CT image reconstructions and MIPs were obtained to evaluate the vascular anatomy. Multidetector CT imaging of the abdomen and pelvis was performed using the standard protocol during bolus administration of intravenous contrast. CONTRAST:  83m OMNIPAQUE IOHEXOL 350 MG/ML SOLN COMPARISON:  Chest radiograph earlier this day. Noncontrast CT abdomen 01/31/2011 FINDINGS: CTA CHEST FINDINGS Acute bilateral pulmonary emboli. On the right this involves the segmental branches of the upper and lower lobes, greatest thromboembolic burden to the right lower lobe. On the left this involves the segmental branches of the lower lobe, greatest thromboembolic burden anteriorly. There is no evidence of right heart strain, RV to LV ratio of 0.97. The heart upper limits normal in size. There are coronary artery calcifications. Thoracic aorta is normal in caliber. No pleural or pericardial effusion. No mediastinal or hilar adenopathy. Bilateral lower lobe atelectasis. No wedge-shaped opacity to suggest pulmonary infarct. Mild bronchial thickening. There are no acute or suspicious osseous abnormalities. CT ABDOMEN and PELVIS FINDINGS Postcholecystectomy with intra and extrahepatic biliary prominence. No focal hepatic lesion. The spleen and adrenal glands are unremarkable. Mild pancreatic ductal prominence of 3 mm without focal pancreatic mass or surrounding inflammation. Heterogeneous enhancement of the left kidney. Mild left perinephric stranding. Subcentimeter hypodensities in the lower left and interpolar right kidney, too small to characterize. Stomach physiologically distended. There are no dilated or thickened bowel loops. Small to moderate stool burden. Diverticulosis of the distal colon without diverticulitis. No free air, free fluid, or intra-abdominal fluid collection. Appendix not visualized. No retroperitoneal adenopathy. Abdominal aorta is normal in caliber. Moderate atherosclerosis without aneurysm.  Within the pelvis the bladder is physiologically distended. Uterus is surgically absent. No adnexal mass. There are no acute or suspicious osseous abnormalities. There is degenerative change throughout spine. Review of the MIP images confirms the above findings. IMPRESSION:  1. Acute bilateral pulmonary emboli involving the segmental branches small to moderate thromboembolic burden. No right heart strain. 2. Heterogeneous enhancement of the left kidney with mild perinephric stranding, raises concern for left pyelonephritis. Recommend correlation with urinalysis. Critical Value/emergent results were called by telephone at the time of interpretation on 04/14/2015 at 11:46 pm to Dr. Delman Kitten , who verbally acknowledged these results. Electronically Signed   By: Jeb Levering M.D.   On: 04/14/2015 23:47   Dg Chest Port 1 View  04/14/2015  CLINICAL DATA:  Right-sided chest pain today, shortness of breath. EXAM: PORTABLE CHEST 1 VIEW COMPARISON:  None. FINDINGS: Study is hypoinspiratory with crowding of the perihilar bronchovascular markings. Given the low lung volumes, lungs appear clear. No evidence of pneumonia. No pleural effusion seen. No pneumothorax. Cardiomediastinal silhouette is within normal limits in size and configuration. Osseous and soft tissue structures about the chest are unremarkable. IMPRESSION: Hypoinspiratory exam with low lung volumes. No evidence of acute cardiopulmonary abnormality. Electronically Signed   By: Franki Cabot M.D.   On: 04/14/2015 21:20    Review of Systems  Constitutional: Negative for fever and chills.  HENT: Negative for sore throat and tinnitus.   Eyes: Negative for blurred vision and redness.  Respiratory: Negative for cough and shortness of breath.   Cardiovascular: Positive for chest pain (with deep inspiration). Negative for palpitations, orthopnea and PND.  Gastrointestinal: Negative for nausea, vomiting, abdominal pain and diarrhea.  Genitourinary:  Negative for dysuria, urgency and frequency.  Musculoskeletal: Negative for myalgias and joint pain.  Skin: Negative for rash.       No lesions  Neurological: Negative for speech change, focal weakness and weakness.  Endo/Heme/Allergies: Does not bruise/bleed easily.       No temperature intolerance  Psychiatric/Behavioral: Negative for depression and suicidal ideas.    Blood pressure 159/70, pulse 69, temperature 99.2 F (37.3 C), temperature source Oral, resp. rate 19, height 5' 7"  (1.702 m), weight 83.915 kg (185 lb), SpO2 98 %. Physical Exam  Vitals reviewed. Constitutional: She is oriented to person, place, and time. She appears well-developed and well-nourished. No distress.  HENT:  Head: Normocephalic and atraumatic.  Mouth/Throat: Oropharynx is clear and moist.  Eyes: Conjunctivae and EOM are normal. Pupils are equal, round, and reactive to light. No scleral icterus.  Neck: Normal range of motion. Neck supple. No JVD present. No tracheal deviation present. No thyromegaly present.  Cardiovascular: Normal rate, regular rhythm and normal heart sounds.  Exam reveals no gallop and no friction rub.   No murmur heard. Respiratory: Effort normal and breath sounds normal.  GI: Soft. Bowel sounds are normal. She exhibits no distension. There is no tenderness.  Genitourinary:  Deferred  Musculoskeletal: She exhibits no edema.  Shortened range of motion left hip  Lymphadenopathy:    She has no cervical adenopathy.  Neurological: She is alert and oriented to person, place, and time. No cranial nerve deficit. She exhibits normal muscle tone.  Skin: Skin is warm and dry. No rash noted. No erythema.  Psychiatric: She has a normal mood and affect. Her behavior is normal. Judgment and thought content normal.     Assessment/Plan This is a 69 year old Caucasian female admitted for bilateral pulmonary emboli and difficulty ambulating. 1. Pulmonary emboli: No right heart strain seen during  angiogram. The patient has not an hypoxic. We will give 1 dose of therapeutic Lovenox emergency department and started the patient on oral anticoagulant therapy prior to discharge. 2. Difficulty ambulating: Patient was  evaluated by orthopedic surgery a number of months ago but still has some difficulty walking due to limited range of movement. She denies pain in her hip. We'll obtain physical therapy and occupational therapy evaluation for outpatient needs. 3. CAD: Stable; continue aspirin 4. DVT prophylaxis: Therapeutic anticoagulation as above 5. GI prophylaxis: None as the patient is not critically ill The patient is a full code. Time spent on admission orders and patient care approximately 45 minutes  Harrie Foreman 04/15/2015, 12:26 AM

## 2015-04-15 NOTE — Plan of Care (Signed)
Problem: Education: Goal: Knowledge of [place] [facility] Education information/materials will improve Outcome: Progressing Pt is alert and oriented, encouraged to call for assistance. PO fluids encouraged.  Problem: Safety: Goal: Ability to remain free from injury will improve Outcome: Progressing High Fall Risk d/t hx of falls, instructed to call before getting up to use the restroom.  Problem: Pain Managment: Goal: General experience of comfort will improve Outcome: Progressing C/o pain 5/10 in R chest. Does not want pain medication at this time. Continue to assess.

## 2015-04-15 NOTE — ED Notes (Signed)
Floor called  For pt, Asencion Partridge, RN

## 2015-04-15 NOTE — Progress Notes (Signed)
April [name] at [place] NAME: April [name]    MR#:  [id number]  DATE OF BIRTH:  09-Mar-1946  SUBJECTIVE:  CHIEF COMPLAINT:   Chief Complaint  Patient presents with  . Pleurisy   -Patient came in with chest pain and noted to have bilateral segmental pulmonary embolism. -Started on eliquis. Blood pressure remains elevated. Chest pain is slightly improving.  REVIEW OF SYSTEMS:  Review of Systems  Constitutional: Negative for fever and chills.  HENT: Negative for congestion, ear discharge, ear pain and nosebleeds.   Respiratory: Positive for shortness of breath. Negative for cough and wheezing.   Cardiovascular: Positive for chest pain. Negative for palpitations.       Pleuritic chest pain on the right side when breathing  Gastrointestinal: Negative for nausea, vomiting, abdominal pain, diarrhea and constipation.  Genitourinary: Negative for dysuria.  Musculoskeletal: Negative for myalgias.  Skin:       Bruising noted on left shin  Neurological: Positive for weakness. Negative for dizziness, sensory change, speech change, focal weakness, seizures and headaches.  Endo/Heme/Allergies: Bruises/bleeds easily.  Psychiatric/Behavioral: Negative for depression.    DRUG ALLERGIES:   Allergies  Allergen Reactions  . Antihistamines, Chlorpheniramine-Type Other (See Comments)    "think crazy thoughts"  . Ciprofloxacin Other (See Comments)    Patient cannot remember reaction  . Dristan Cold [Chlorphen-Pe-Acetaminophen] Other (See Comments)    "think crazy thoughts"  . Ginger Diarrhea    VITALS:  Blood pressure 181/66, pulse 74, temperature 98.1 F (36.7 C), temperature source Oral, resp. rate 16, height 5\' 7"  (1.702 m), weight 84.55 kg (186 lb 6.4 oz), SpO2 99 %.  PHYSICAL EXAMINATION:  Physical Exam  GENERAL:  69 y.o.-year-old patient lying in the bed with no acute distress.  EYES: Pupils equal, round, reactive to light and  accommodation. No scleral icterus. Extraocular muscles intact.  HEENT: Head atraumatic, normocephalic. Oropharynx and nasopharynx clear.  NECK:  Supple, no jugular venous distention. No thyroid enlargement, no tenderness.  LUNGS: Normal breath sounds bilaterally, no wheezing, rales,rhonchi or crepitation. No use of accessory muscles of respiration. Decreased bibasilar breath sounds from poor inspiratory effort CARDIOVASCULAR: S1, S2 normal. No murmurs, rubs, or gallops.  ABDOMEN: Soft, nontender, nondistended. Bowel sounds present. No organomegaly or mass.  EXTREMITIES: No pedal edema, cyanosis, or clubbing. Left shin bruising NEUROLOGIC: Cranial nerves II through XII are intact. Muscle strength 5/5 in all extremities. Sensation intact. Gait not checked.  PSYCHIATRIC: The patient is alert and oriented x 3.  SKIN: No obvious rash, lesion, or ulcer.    LABORATORY PANEL:   CBC  Recent Labs Lab 04/15/15 0419  WBC 7.4  HGB 11.5*  HCT 34.8*  PLT 283   ------------------------------------------------------------------------------------------------------------------  Chemistries   Recent Labs Lab 04/14/15 2038 04/15/15 0419  NA 137 139  K 3.6 3.3*  CL 103 103  CO2 25 26  GLUCOSE 105* 104*  BUN 23* 23*  CREATININE 1.19* 1.09*  CALCIUM 9.1 9.1  AST 51*  --   ALT 91*  --   ALKPHOS 73  --   BILITOT 1.0  --    ------------------------------------------------------------------------------------------------------------------  Cardiac Enzymes  Recent Labs Lab 04/14/15 2038  TROPONINI <0.03   ------------------------------------------------------------------------------------------------------------------  RADIOLOGY:  Ct Angio Chest Pe W/cm &/or Wo Cm  04/14/2015  CLINICAL DATA:  Right-sided pleuritic chest pain. Recent diagnosis of noro virus. EXAM: CT ANGIOGRAPHY CHEST CT ABDOMEN AND PELVIS WITH CONTRAST TECHNIQUE: Multidetector CT imaging of the chest was performed using  the standard protocol during bolus administration of intravenous contrast. Multiplanar CT image reconstructions and MIPs were obtained to evaluate the vascular anatomy. Multidetector CT imaging of the abdomen and pelvis was performed using the standard protocol during bolus administration of intravenous contrast. CONTRAST:  18mL OMNIPAQUE IOHEXOL 350 MG/ML SOLN COMPARISON:  Chest radiograph earlier this day. Noncontrast CT abdomen 01/31/2011 FINDINGS: CTA CHEST FINDINGS Acute bilateral pulmonary emboli. On the right this involves the segmental branches of the upper and lower lobes, greatest thromboembolic burden to the right lower lobe. On the left this involves the segmental branches of the lower lobe, greatest thromboembolic burden anteriorly. There is no evidence of right heart strain, RV to LV ratio of 0.97. The heart upper limits normal in size. There are coronary artery calcifications. Thoracic aorta is normal in caliber. No pleural or pericardial effusion. No mediastinal or hilar adenopathy. Bilateral lower lobe atelectasis. No wedge-shaped opacity to suggest pulmonary infarct. Mild bronchial thickening. There are no acute or suspicious osseous abnormalities. CT ABDOMEN and PELVIS FINDINGS Postcholecystectomy with intra and extrahepatic biliary prominence. No focal hepatic lesion. The spleen and adrenal glands are unremarkable. Mild pancreatic ductal prominence of 3 mm without focal pancreatic mass or surrounding inflammation. Heterogeneous enhancement of the left kidney. Mild left perinephric stranding. Subcentimeter hypodensities in the lower left and interpolar right kidney, too small to characterize. Stomach physiologically distended. There are no dilated or thickened bowel loops. Small to moderate stool burden. Diverticulosis of the distal colon without diverticulitis. No free air, free fluid, or intra-abdominal fluid collection. Appendix not visualized. No retroperitoneal adenopathy. Abdominal aorta is  normal in caliber. Moderate atherosclerosis without aneurysm. Within the pelvis the bladder is physiologically distended. Uterus is surgically absent. No adnexal mass. There are no acute or suspicious osseous abnormalities. There is degenerative change throughout spine. Review of the MIP images confirms the above findings. IMPRESSION: 1. Acute bilateral pulmonary emboli involving the segmental branches small to moderate thromboembolic burden. No right heart strain. 2. Heterogeneous enhancement of the left kidney with mild perinephric stranding, raises concern for left pyelonephritis. Recommend correlation with urinalysis. Critical Value/emergent results were called by telephone at the time of interpretation on 04/14/2015 at 11:46 pm to Dr. Delman Kitten , who verbally acknowledged these results. Electronically Signed   By: Jeb Levering M.D.   On: 04/14/2015 23:47   Ct Abdomen Pelvis W Contrast  04/14/2015  CLINICAL DATA:  Right-sided pleuritic chest pain. Recent diagnosis of noro virus. EXAM: CT ANGIOGRAPHY CHEST CT ABDOMEN AND PELVIS WITH CONTRAST TECHNIQUE: Multidetector CT imaging of the chest was performed using the standard protocol during bolus administration of intravenous contrast. Multiplanar CT image reconstructions and MIPs were obtained to evaluate the vascular anatomy. Multidetector CT imaging of the abdomen and pelvis was performed using the standard protocol during bolus administration of intravenous contrast. CONTRAST:  59mL OMNIPAQUE IOHEXOL 350 MG/ML SOLN COMPARISON:  Chest radiograph earlier this day. Noncontrast CT abdomen 01/31/2011 FINDINGS: CTA CHEST FINDINGS Acute bilateral pulmonary emboli. On the right this involves the segmental branches of the upper and lower lobes, greatest thromboembolic burden to the right lower lobe. On the left this involves the segmental branches of the lower lobe, greatest thromboembolic burden anteriorly. There is no evidence of right heart strain, RV to LV  ratio of 0.97. The heart upper limits normal in size. There are coronary artery calcifications. Thoracic aorta is normal in caliber. No pleural or pericardial effusion. No mediastinal or hilar adenopathy. Bilateral lower lobe atelectasis. No wedge-shaped opacity to suggest  pulmonary infarct. Mild bronchial thickening. There are no acute or suspicious osseous abnormalities. CT ABDOMEN and PELVIS FINDINGS Postcholecystectomy with intra and extrahepatic biliary prominence. No focal hepatic lesion. The spleen and adrenal glands are unremarkable. Mild pancreatic ductal prominence of 3 mm without focal pancreatic mass or surrounding inflammation. Heterogeneous enhancement of the left kidney. Mild left perinephric stranding. Subcentimeter hypodensities in the lower left and interpolar right kidney, too small to characterize. Stomach physiologically distended. There are no dilated or thickened bowel loops. Small to moderate stool burden. Diverticulosis of the distal colon without diverticulitis. No free air, free fluid, or intra-abdominal fluid collection. Appendix not visualized. No retroperitoneal adenopathy. Abdominal aorta is normal in caliber. Moderate atherosclerosis without aneurysm. Within the pelvis the bladder is physiologically distended. Uterus is surgically absent. No adnexal mass. There are no acute or suspicious osseous abnormalities. There is degenerative change throughout spine. Review of the MIP images confirms the above findings. IMPRESSION: 1. Acute bilateral pulmonary emboli involving the segmental branches small to moderate thromboembolic burden. No right heart strain. 2. Heterogeneous enhancement of the left kidney with mild perinephric stranding, raises concern for left pyelonephritis. Recommend correlation with urinalysis. Critical Value/emergent results were called by telephone at the time of interpretation on 04/14/2015 at 11:46 pm to Dr. Delman Kitten , who verbally acknowledged these results.  Electronically Signed   By: Jeb Levering M.D.   On: 04/14/2015 23:47   Dg Chest Port 1 View  04/14/2015  CLINICAL DATA:  Right-sided chest pain today, shortness of breath. EXAM: PORTABLE CHEST 1 VIEW COMPARISON:  None. FINDINGS: Study is hypoinspiratory with crowding of the perihilar bronchovascular markings. Given the low lung volumes, lungs appear clear. No evidence of pneumonia. No pleural effusion seen. No pneumothorax. Cardiomediastinal silhouette is within normal limits in size and configuration. Osseous and soft tissue structures about the chest are unremarkable. IMPRESSION: Hypoinspiratory exam with low lung volumes. No evidence of acute cardiopulmonary abnormality. Electronically Signed   By: Franki Cabot M.D.   On: 04/14/2015 21:20    EKG:   Orders placed or performed during the hospital encounter of 04/14/15  . EKG 12-Lead  . EKG 12-Lead    ASSESSMENT AND PLAN:   69 year old female with past medical history significant for CAD status post stent placement, diverticulosis presents to the hospital secondary to chest pain and noted to have bilateral pulmonary emboli.  #1 bilateral pulmonary emboli-acute involving segmental branches with small to moderate thromboembolic burden. -No right heart strain noted. -Started on eliquis, continue eliquis at discharge as well. -Doppler of lower extremities to rule out any DVT. -Decreased mobilization after her recent fall likely the source. Will need hypercoagulable workup as outpatient -Pain medications as needed for her pleuritic chest pain.  #2 hypertensive urgency-restart her atenolol -Control pain. Also IV hydralazine when necessary.  # 3 CAD status post stent-about 6 years ago. -Hold aspirin due to her significant bruising history now that she'll be on eliquis. -Continue statin and atenolol  #4 because anemia-potassium being replaced  #5 DVT prophylaxis-currently on eliquis   All the records are reviewed and case discussed  with Care Management/Social Workerr. Management plans discussed with the patient, family and they are in agreement.  CODE STATUS: Full code  TOTAL TIME TAKING CARE OF THIS PATIENT: 38 minutes.   POSSIBLE D/C TOMORROW, DEPENDING ON CLINICAL CONDITION.   Gladstone Lighter M.D on 04/15/2015 at 12:40 PM  Between 7am to 6pm - Pager - [phone]  After 6pm go to [email or web address] - [place]  [facility]  Office  [phone]  CC: Primary care physician; [facility], [name], MD

## 2015-04-15 NOTE — Progress Notes (Signed)
Physical Therapy Evaluation Patient Details Name: April [name] MRN: [id number] DOB: May 16, 1945 Today's Date: 04/15/2015   History of Present Illness  The patient presents to the emergency department complaining of pain with deep inspiration as well as pain with certain movements. She states that she's had a vague sensation of discomfort for a few days but that the pain has acutely worsened today. Family members report that she appeared flushed and she reports feeling sweaty prior to arrival. Notably she has been afebrile. No hypoxia since arrival to the emergency department. She denies chest pain at rest or with normal inspirations as well as denying nausea, vomiting or frank diaphoresis. CT angiogram of the chest revealed bilateral pulmonary emboli. The patient reports falling 3 months ago which has limited her exercise (although she still walks approximately 2 miles per week) and has changed her gait. The patient reports feeling somewhat unsteady on her feet as well.   Clinical Impression  Pt limited this session due to nausea and actively throwing up upon sitting up at edge of bed. Pt required CGA for transfer and ambulation with RW for 50'.  Pt would benefit from acute PT services to address objective findings.      Follow Up Recommendations Home health PT    Equipment Recommendations  Rolling walker with 5" wheels    Recommendations for Other Services OT consult     Precautions / Restrictions Precautions Precautions: Fall Restrictions Weight Bearing Restrictions: No      Mobility  Bed Mobility Overal bed mobility: Modified Independent                Transfers Overall transfer level: Needs assistance   Transfers: Sit to/from Stand Sit to Stand: Min guard         General transfer comment: Pt feeling sick on her stomach; throwing up upon sitting  Ambulation/Gait Ambulation/Gait assistance: Supervision Ambulation Distance (Feet): 50 Feet Assistive device:  Rolling walker (2 wheeled) Gait Pattern/deviations: Step-through pattern Gait velocity: slow      Stairs            Wheelchair Mobility    Modified Rankin (Stroke Patients Only)       Balance Overall balance assessment: Modified Independent                                           Pertinent Vitals/Pain Pain Assessment: 0-10 Pain Score: 6  Pain Location: ribs Pain Descriptors / Indicators: Sharp Pain Intervention(s): Monitored during session;Limited activity within patient's tolerance    Home Living Family/patient expects to be discharged to:: Private residence Living Arrangements: Alone Available Help at Discharge: Family (Niece) Type of Home: House Home Access: Stairs to enter Entrance Stairs-Rails: Can reach both Entrance Stairs-Number of Steps: 2 Home Layout: One level Home Equipment: Walker - 2 wheels      Prior Function Level of Independence: Independent with assistive device(s)         Comments: Pt walking regularly with friend for a couple miles a week'; indepdent with all activities but having trouble with R leg since fall.     Hand Dominance   Dominant Hand: Right    Extremity/Trunk Assessment   Upper Extremity Assessment: Overall WFL for tasks assessed           Lower Extremity Assessment: Overall WFL for tasks assessed         Communication  Communication: No difficulties  Cognition Arousal/Alertness: Awake/alert Behavior During Therapy: WFL for tasks assessed/performed Overall Cognitive Status: Within Functional Limits for tasks assessed                      General Comments      Exercises        Assessment/Plan    PT Assessment Patient needs continued PT services  PT Diagnosis Difficulty walking;Acute pain;Generalized weakness   PT Problem List Decreased strength;Decreased activity tolerance;Decreased mobility  PT Treatment Interventions DME instruction;Gait training;Functional  mobility training;Therapeutic activities;Therapeutic exercise   PT Goals (Current goals can be found in the Care Plan section) Acute Rehab PT Goals Patient Stated Goal: To get back home. PT Goal Formulation: With patient Time For Goal Achievement: 04/22/15 Potential to Achieve Goals: Good    Frequency Min 2X/week   Barriers to discharge Decreased caregiver support      Co-evaluation               End of Session Equipment Utilized During Treatment: Gait belt Activity Tolerance: Patient limited by fatigue;Patient limited by pain Patient left: in bed;with call bell/phone within reach;with bed alarm set;with family/visitor present Nurse Communication: Mobility status    Functional Assessment Tool Used: Pt with limited gait; required CGA for transfers Functional Limitation: Mobility: Walking and moving around Mobility: Walking and Moving Around Current Status [id number]): At least 20 percent but less than 40 percent impaired, limited or restricted Mobility: Walking and Moving Around Goal Status [phone]): At least 1 percent but less than 20 percent impaired, limited or restricted    Time: 0940-1007 PT Time Calculation (min) (ACUTE ONLY): 27 min   Charges:   PT Evaluation $Initial PT Evaluation Tier I: 1 Procedure     PT G Codes:   PT G-Codes **NOT FOR INPATIENT CLASS** Functional Assessment Tool Used: Pt with limited gait; required CGA for transfers Functional Limitation: Mobility: Walking and moving around Mobility: Walking and Moving Around Current Status [id number]): At least 20 percent but less than 40 percent impaired, limited or restricted Mobility: Walking and Moving Around Goal Status [phone]): At least 1 percent but less than 20 percent impaired, limited or restricted    [employer], PT 04/15/2015, 3:13 PM

## 2015-04-15 NOTE — Evaluation (Signed)
Occupational Therapy Evaluation Patient Details Name: April [name] MRN: [id number] DOB: 1945/08/30 Today's Date: 04/15/2015    History of Present Illness Pt is 69 year old female who presented to emergency department complaining of pain with deep inspiration as well as pain with certain movements. She states that she's had a vague sensation of discomfort for a few days but that the pain has acutely worsened today. Family members report that she appeared flushed and she reports feeling sweaty prior to arrival. Notably she has been afebrile. No hypoxia since arrival to the emergency department. She denies chest pain at rest or with normal inspirations as well as denying nausea, vomiting or frank diaphoresis. CT angiogram of the chest revealed bilateral pulmonary emboli. The patient reports falling 3 months ago which has limited her exercise (although she still walks approximately 2 miles per week) and has changed her gait. The patient reports feeling somewhat unsteady on her feet as well.   Clinical Impression   Pt is able to complete ADLs with CGA assist for walking to toilet and back to bed but is unable to lean foward to complete LB dressing or bathing skills due to pain in rib area and decreased stamina for ADLs and needs frequent rest breaks.  She has mild decreased balance during functional mobility and lives at home alone.  She will have family and neighbors helping her when she goes home.  Her nephew Elta Guadeloupe was present for eval. She would benefit from instruction in energy conservation tech, instruction in use of AD for ADLs and increase balance during functional mobility tasks.  Rec OT HH to ensure pt is safe and able to complete ADLs independently since she is a high fall risk based on her decreased stamina and activity tolerance.      Follow Up Recommendations  Home health OT    Equipment Recommendations   (has a reacher and sock aid at home from her husband who passed away)     Recommendations for Other Services       Precautions / Restrictions Precautions Precautions: Fall (no fall risk score in chart but has hx of fall 3 months ago) Restrictions Weight Bearing Restrictions: No      Mobility Bed Mobility                  Transfers                      Balance                                            ADL Overall ADL's : Needs assistance/impaired                                       General ADL Comments: Pt is able to complete ADLs with CGA assist for walking to toilet and back to bed but is unable to lean foward to complete LB dressing or bathing skills due to pain in rib area and decreased stamina for ADLs and needs frequent rest breaks.  She has mild decreased balance during functional mobility and lives at home alone.  She will have family and neighbors helping her when she goes home.  Her nephew Elta Guadeloupe was present for eval.     Vision  Perception     Praxis      Pertinent Vitals/Pain Pain Assessment: 0-10 Pain Score: 6  Pain Location: R rib area only with deep inhalation Pain Descriptors / Indicators: Sharp Pain Intervention(s): Monitored during session;Premedicated before session     Hand Dominance Right   Extremity/Trunk Assessment Upper Extremity Assessment Upper Extremity Assessment: Overall WFL for tasks assessed   Lower Extremity Assessment Lower Extremity Assessment: Defer to PT evaluation       Communication Communication Communication: No difficulties   Cognition Arousal/Alertness: Awake/alert Behavior During Therapy: WFL for tasks assessed/performed Overall Cognitive Status: Within Functional Limits for tasks assessed                     General Comments       Exercises       Shoulder Instructions      Home Living Family/patient expects to be discharged to:: Private residence Living Arrangements: Alone Available Help at Discharge:  Family;Friend(s) Type of Home: House (1 story modular) Home Access: Stairs to enter [employer] of Steps: 2 Entrance Stairs-Rails: Can reach both Home Layout: One level     Bathroom Shower/Tub: Walk-in shower;Door (built in seat)   Biochemist, clinical: Handicapped height Bathroom Accessibility: Yes   Home Equipment: Environmental consultant - 2 wheels          Prior Functioning/Environment Level of Independence: Independent        Comments: pt was independent and driving and active at her church and in the community    OT Diagnosis: Generalized weakness;Acute pain   OT Problem List: Decreased strength;Decreased activity tolerance;Pain   OT Treatment/Interventions: Self-care/ADL training;Patient/family education;DME and/or AE instruction    OT Goals(Current goals can be found in the care plan section) Acute Rehab OT Goals Patient Stated Goal: "to go home soon" OT Goal Formulation: With patient/family Time For Goal Achievement: 04/29/15 Potential to Achieve Goals: Good  OT Frequency: Min 1X/week   Barriers to D/C:    lives at home alone       Co-evaluation              End of Session Nurse Communication:  (clearance for participation in OT eval)  Activity Tolerance: Patient limited by fatigue Patient left: in bed;with call bell/phone within reach;with bed alarm set;with family/visitor present Elta Guadeloupe, nephew visiting)   Time: [phone] OT Time Calculation (min): 30 min Charges:  OT General Charges $OT Visit: 1 Procedure OT Evaluation $Initial OT Evaluation Tier I: 1 Procedure OT Treatments $Self Care/Home Management : 8-22 mins G-Codes: OT G-codes **NOT FOR INPATIENT CLASS** Functional Assessment Tool Used: clinical judgment Functional Limitation: Self care Self Care Current Status [id number]): At least 20 percent but less than 40 percent impaired, limited or restricted Self Care Goal Status [id number]): At least 1 percent but less than 20 percent impaired, limited or  restricted  Nychelle Cassata 04/15/2015, 2:29 PM   Chrys Racer, OTR/L ascom [phone]

## 2015-04-15 NOTE — Progress Notes (Signed)
ANTICOAGULATION CONSULT NOTE - Initial Consult  Pharmacy Consult for Eliquis dosing Indication: pulmonary embolus  Allergies  Allergen Reactions  . Antihistamines, Chlorpheniramine-Type Other (See Comments)    "think crazy thoughts"  . Ciprofloxacin Other (See Comments)    Patient cannot remember reaction  . Dristan Cold [Chlorphen-Pe-Acetaminophen] Other (See Comments)    "think crazy thoughts"  . Ginger Diarrhea    Patient Measurements: Height: 5\' 7"  (170.2 cm) Weight: 186 lb 6.4 oz (84.55 kg) IBW/kg (Calculated) : 61.6 Heparin Dosing Weight: n/a  Vital Signs: Temp: 97.5 F (36.4 C) (12/18 0148) Temp Source: Oral (12/18 0148) BP: 158/65 mmHg (12/18 0148) Pulse Rate: 66 (12/18 0148)  Labs:  Recent Labs  04/14/15 2038  HGB 11.9*  HCT 35.7  PLT 258  CREATININE 1.19*  TROPONINI <0.03    Estimated Creatinine Clearance: 49.2 mL/min (by C-G formula based on Cr of 1.19).   Medical History: Past Medical History  Diagnosis Date  . Diverticula of colon   . Cardiac abnormality     STENT    Medications:    Assessment: Hgb 11.9  Goal of Therapy:  Therapeutic anticoagulation   Plan:  Apixaban 10 mg BID x 7 days and then 5 mg BID ordered. Dose to start ~10 hours after last dose of enoxaparin.  Fisher Hargadon S 04/15/2015,3:15 AM

## 2015-04-15 NOTE — Progress Notes (Signed)
Patient was A&O this shift BP in the 180's earlier but came down after scheduled atenolol  Family at beside Patient c/o nausea x1 relieved well with prn Zofran and pain right chest relieved by prn oxycodone

## 2015-04-15 NOTE — Progress Notes (Signed)
OT Cancellation Note  Patient Details Name: Paying Arispe MRN: [id number] DOB: 01-May-1945   Cancelled Treatment:    Reason Eval/Treat Not Completed: Medical issues which prohibited therapy.  Order received and chart reviewed.  Spoke to [facility] for clearance for OT eval and pt's BP was high and NSG asked for eval to wait at least an hour to reassess BP.  Will attempt evaluation in a couple of hours.  [place], OTR/L ascom [phone] 04/15/2015, 12:13 PM

## 2015-04-16 LAB — BASIC METABOLIC PANEL
ANION GAP: 7 (ref 5–15)
BUN: 18 mg/dL (ref 6–20)
CALCIUM: 9 mg/dL (ref 8.9–10.3)
CO2: 23 mmol/L (ref 22–32)
Chloride: 109 mmol/L (ref 101–111)
Creatinine, Ser: 1.05 mg/dL — ABNORMAL HIGH (ref 0.44–1.00)
GFR, EST NON AFRICAN AMERICAN: 53 mL/min — AB (ref >60)
Glucose, Bld: 109 mg/dL — ABNORMAL HIGH (ref 65–99)
POTASSIUM: 3.7 mmol/L (ref 3.5–5.1)
Sodium: 139 mmol/L (ref 135–145)

## 2015-04-16 LAB — CBC
HEMATOCRIT: 33.8 % — AB (ref 35.0–47.0)
Hemoglobin: 11.1 g/dL — ABNORMAL LOW (ref 12.0–16.0)
MCH: 29.4 pg (ref 26.0–34.0)
MCHC: 32.9 g/dL (ref 32.0–36.0)
MCV: 89.5 fL (ref 80.0–100.0)
PLATELETS: 337 10*3/uL (ref 150–440)
RBC: 3.78 MIL/uL — AB (ref 3.80–5.20)
RDW: 13.1 % (ref 11.5–14.5)
WBC: 8 10*3/uL (ref 3.6–11.0)

## 2015-04-16 MED ORDER — TRAMADOL HCL 50 MG PO TABS: 50.0000 mg | ORAL_TABLET | Freq: Four times a day (QID) | ORAL | Status: DC | PRN

## 2015-04-16 MED ORDER — APIXABAN 5 MG PO TABS: 10.0000 mg | ORAL_TABLET | Freq: Two times a day (BID) | ORAL | Status: DC

## 2015-04-16 MED ORDER — APIXABAN 5 MG PO TABS: 5.0000 mg | ORAL_TABLET | Freq: Two times a day (BID) | ORAL | Status: DC

## 2015-04-16 NOTE — Plan of Care (Signed)
Problem: Education: Goal: Knowledge of [facility] Education information/materials will improve Outcome: Progressing No complaints of pain. Ambulate with assist. No nausea or vomiting noted.

## 2015-04-16 NOTE — Discharge Instructions (Signed)
Please check blood pressure twice at home, if the evening pressure shows the top number (systolic number) [phone], take an extra atenolol and follow up with PCP

## 2015-04-16 NOTE — Discharge Summary (Signed)
April [name] at [facility] NAME: April [name]    MR#:  [id number]  DATE OF BIRTH:  [birth year]  DATE OF ADMISSION:  04/14/2015 ADMITTING PHYSICIAN: April Foreman, MD  DATE OF DISCHARGE: 04/16/2015  PRIMARY CARE PHYSICIAN: April [name], April Noa, MD    ADMISSION DIAGNOSIS:  Pleuritic pain [R07.81] Pulmonary embolism, bilateral (April [name]) [I26.99]  DISCHARGE DIAGNOSIS:  Active Problems:   Pulmonary embolism (HCC)   Pulmonary emboli (HCC)   SECONDARY DIAGNOSIS:   Past Medical History  Diagnosis Date  . Diverticula of colon   . Cardiac abnormality     STENT    HOSPITAL COURSE:   69 year old female with past medical history significant for CAD status post stent placement, diverticulosis presents to the hospital secondary to chest pain and noted to have bilateral pulmonary emboli.  #1 Bilateral pulmonary emboli-acute- involving segmental branches with small to moderate thromboembolic burden. -No right heart strain noted. -on eliquis, continue eliquis at discharge as well. -Doppler of lower extremities negative for any DVT. -Decreased mobilization after her recent fall likely the source. Will [name] hypercoagulable workup as outpatient -Pain medications as needed for her pleuritic chest pain.  #2 hypertensive urgency-restart her atenolol -Control pain.Advised to check BP at home as well and gave instructions when to take an extra dose of atenolol .  # 3 CAD status post stent-about 6 years ago. -discontinue aspirin due to her significant bruising history now that she'll be on eliquis. -Continue statin and atenolol  #4 Hypokalemia-potassium replaced  Patient ambulating well. PT recommended home health Discharge today  DISCHARGE CONDITIONS:   Stable  CONSULTS OBTAINED:  Treatment Team:  April [place], MD  DRUG ALLERGIES:   Allergies  Allergen Reactions  . Antihistamines, Chlorpheniramine-Type Other (See Comments)    "think  crazy thoughts"  . Ciprofloxacin Other (See Comments)    Patient cannot remember reaction  . Dristan Cold [Chlorphen-Pe-Acetaminophen] Other (See Comments)    "think crazy thoughts"  . Ginger Diarrhea    DISCHARGE MEDICATIONS:   Current Discharge Medication List    START taking these medications   Details  !! apixaban (ELIQUIS) 5 MG TABS tablet Take 2 tablets (10 mg total) by mouth 2 (two) times daily. Until 04/21/15 and then you will be on 1 tab orally twice a day Qty: 12 tablet, Refills: 0    !! apixaban (ELIQUIS) 5 MG TABS tablet Take 1 tablet (5 mg total) by mouth 2 (two) times daily. Start from 04/22/15 Qty: 60 tablet, Refills: 2    traMADol (ULTRAM) 50 MG tablet Take 1 tablet (50 mg total) by mouth every 6 (six) hours as needed for moderate pain or severe pain. Qty: 25 tablet, Refills: 0     !! - Potential duplicate medications found. Please discuss with provider.    CONTINUE these medications which have NOT CHANGED   Details  atenolol (TENORMIN) 25 MG tablet Take 1 tablet by mouth daily.    atorvastatin (LIPITOR) 10 MG tablet Take 1 tablet by mouth daily.    calcium citrate-vitamin D (CITRACAL+D) 315-200 MG-UNIT tablet Take 1 tablet by mouth 2 (two) times daily.    Cholecalciferol (VITAMIN D3) 2000 UNITS capsule Take 1 capsule by mouth daily.    promethazine (PHENERGAN) 25 MG tablet Take 1 tablet (25 mg total) by mouth every 6 (six) hours as needed for nausea or vomiting. Qty: 20 tablet, Refills: 0    sulfamethoxazole-trimethoprim (BACTRIM DS) 800-160 MG tablet Take 1 tablet by mouth 2 (  two) times daily. Qty: 20 tablet, Refills: 0      STOP taking these medications     aspirin EC 81 MG tablet          DISCHARGE INSTRUCTIONS:   1. PCP f/u in 1 week for hypercoagulable work up and also BP monitoring  If you experience worsening of your admission symptoms, develop shortness of breath, life threatening emergency, suicidal or homicidal thoughts you must seek  medical attention immediately by calling 911 or calling your MD immediately  if symptoms less severe.  You Must read complete instructions/literature along with all the possible adverse reactions/side effects for all the Medicines you take and that have been prescribed to you. Take any new Medicines after you have completely understood and accept all the possible adverse reactions/side effects.   Please note  You were cared for by a hospitalist during your hospital stay. If you have any questions about your discharge medications or the care you received while you were in the hospital after you are discharged, you can call the unit and asked to speak with the hospitalist on call if the hospitalist that took care of you is not available. Once you are discharged, your primary care physician will handle any further medical issues. Please note that NO REFILLS for any discharge medications will be authorized once you are discharged, as it is imperative that you return to your primary care physician (or establish a relationship with a primary care physician if you do not have one) for your aftercare needs so that they can reassess your [name] for medications and monitor your lab values.    Today   CHIEF COMPLAINT:   Chief Complaint  Patient presents with  . Pleurisy    VITAL SIGNS:  Blood pressure 155/61, pulse 68, temperature 99.1 F (37.3 C), temperature source Oral, resp. rate 18, height 5\' 7"  (1.702 m), weight 84.55 kg (186 lb 6.4 oz), SpO2 94 %.  I/O:   Intake/Output Summary (Last 24 hours) at 04/16/15 0809 Last data filed at 04/15/15 2100  Gross per 24 hour  Intake    360 ml  Output    650 ml  Net   -290 ml    PHYSICAL EXAMINATION:   Physical Exam  GENERAL: 69 y.o.-year-old patient lying in the bed with no acute distress.  EYES: Pupils equal, round, reactive to light and accommodation. No scleral icterus. Extraocular muscles intact.  HEENT: Head atraumatic, normocephalic.  Oropharynx and nasopharynx clear.  NECK: Supple, no jugular venous distention. No thyroid enlargement, no tenderness.  LUNGS: Normal breath sounds bilaterally, no wheezing, rales,rhonchi or crepitation. No use of accessory muscles of respiration. Decreased bibasilar breath sounds from poor inspiratory effort CARDIOVASCULAR: S1, S2 normal. No murmurs, rubs, or gallops.  ABDOMEN: Soft, nontender, nondistended. Bowel sounds present. No organomegaly or mass.  EXTREMITIES: No pedal edema, cyanosis, or clubbing. Left shin bruising NEUROLOGIC: Cranial nerves II through XII are intact. Muscle strength 5/5 in all extremities. Sensation intact. Gait not checked.  PSYCHIATRIC: The patient is alert and oriented x 3.  SKIN: No obvious rash, lesion, or ulcer.   DATA REVIEW:   CBC  Recent Labs Lab 04/16/15 0532  WBC 8.0  HGB 11.1*  HCT 33.8*  PLT 337    Chemistries   Recent Labs Lab 04/14/15 2038  04/16/15 0532  NA 137  < > 139  K 3.6  < > 3.7  CL 103  < > 109  CO2 25  < > 23  GLUCOSE  105*  < > 109*  BUN 23*  < > 18  CREATININE 1.19*  < > 1.05*  CALCIUM 9.1  < > 9.0  AST 51*  --   --   ALT 91*  --   --   ALKPHOS 73  --   --   BILITOT 1.0  --   --   < > = values in this interval not displayed.  Cardiac Enzymes  Recent Labs Lab 04/14/15 2038  TROPONINI <0.03    Microbiology Results  No results found for this or any previous visit.  RADIOLOGY:  Ct Angio Chest Pe W/cm &/or Wo Cm  04/14/2015  CLINICAL DATA:  Right-sided pleuritic chest pain. Recent diagnosis of noro virus. EXAM: CT ANGIOGRAPHY CHEST CT ABDOMEN AND PELVIS WITH CONTRAST TECHNIQUE: Multidetector CT imaging of the chest was performed using the standard protocol during bolus administration of intravenous contrast. Multiplanar CT image reconstructions and MIPs were obtained to evaluate the vascular anatomy. Multidetector CT imaging of the abdomen and pelvis was performed using the standard protocol during bolus  administration of intravenous contrast. CONTRAST:  95mL OMNIPAQUE IOHEXOL 350 MG/ML SOLN COMPARISON:  Chest radiograph earlier this day. Noncontrast CT abdomen 01/31/2011 FINDINGS: CTA CHEST FINDINGS Acute bilateral pulmonary emboli. On the right this involves the segmental branches of the upper and lower lobes, greatest thromboembolic burden to the right lower lobe. On the left this involves the segmental branches of the lower lobe, greatest thromboembolic burden anteriorly. There is no evidence of right heart strain, RV to LV ratio of 0.97. The heart upper limits normal in size. There are coronary artery calcifications. Thoracic aorta is normal in caliber. No pleural or pericardial effusion. No mediastinal or hilar adenopathy. Bilateral lower lobe atelectasis. No wedge-shaped opacity to suggest pulmonary infarct. Mild bronchial thickening. There are no acute or suspicious osseous abnormalities. CT ABDOMEN and PELVIS FINDINGS Postcholecystectomy with intra and extrahepatic biliary prominence. No focal hepatic lesion. The spleen and adrenal glands are unremarkable. Mild pancreatic ductal prominence of 3 mm without focal pancreatic mass or surrounding inflammation. Heterogeneous enhancement of the left kidney. Mild left perinephric stranding. Subcentimeter hypodensities in the lower left and interpolar right kidney, too small to characterize. Stomach physiologically distended. There are no dilated or thickened bowel loops. Small to moderate stool burden. Diverticulosis of the distal colon without diverticulitis. No free air, free fluid, or intra-abdominal fluid collection. Appendix not visualized. No retroperitoneal adenopathy. Abdominal aorta is normal in caliber. Moderate atherosclerosis without aneurysm. Within the pelvis the bladder is physiologically distended. Uterus is surgically absent. No adnexal mass. There are no acute or suspicious osseous abnormalities. There is degenerative change throughout spine.  Review of the MIP images confirms the above findings. IMPRESSION: 1. Acute bilateral pulmonary emboli involving the segmental branches small to moderate thromboembolic burden. No right heart strain. 2. Heterogeneous enhancement of the left kidney with mild perinephric stranding, raises concern for left pyelonephritis. Recommend correlation with urinalysis. Critical Value/emergent results were called by telephone at the time of interpretation on 04/14/2015 at 11:46 pm to Dr. Delman Kitten , who verbally acknowledged these results. Electronically Signed   By: Jeb Levering M.D.   On: 04/14/2015 23:47   Ct Abdomen Pelvis W Contrast  04/14/2015  CLINICAL DATA:  Right-sided pleuritic chest pain. Recent diagnosis of noro virus. EXAM: CT ANGIOGRAPHY CHEST CT ABDOMEN AND PELVIS WITH CONTRAST TECHNIQUE: Multidetector CT imaging of the chest was performed using the standard protocol during bolus administration of intravenous contrast. Multiplanar CT image reconstructions  and MIPs were obtained to evaluate the vascular anatomy. Multidetector CT imaging of the abdomen and pelvis was performed using the standard protocol during bolus administration of intravenous contrast. CONTRAST:  99mL OMNIPAQUE IOHEXOL 350 MG/ML SOLN COMPARISON:  Chest radiograph earlier this day. Noncontrast CT abdomen 01/31/2011 FINDINGS: CTA CHEST FINDINGS Acute bilateral pulmonary emboli. On the right this involves the segmental branches of the upper and lower lobes, greatest thromboembolic burden to the right lower lobe. On the left this involves the segmental branches of the lower lobe, greatest thromboembolic burden anteriorly. There is no evidence of right heart strain, RV to LV ratio of 0.97. The heart upper limits normal in size. There are coronary artery calcifications. Thoracic aorta is normal in caliber. No pleural or pericardial effusion. No mediastinal or hilar adenopathy. Bilateral lower lobe atelectasis. No wedge-shaped opacity to  suggest pulmonary infarct. Mild bronchial thickening. There are no acute or suspicious osseous abnormalities. CT ABDOMEN and PELVIS FINDINGS Postcholecystectomy with intra and extrahepatic biliary prominence. No focal hepatic lesion. The spleen and adrenal glands are unremarkable. Mild pancreatic ductal prominence of 3 mm without focal pancreatic mass or surrounding inflammation. Heterogeneous enhancement of the left kidney. Mild left perinephric stranding. Subcentimeter hypodensities in the lower left and interpolar right kidney, too small to characterize. Stomach physiologically distended. There are no dilated or thickened bowel loops. Small to moderate stool burden. Diverticulosis of the distal colon without diverticulitis. No free air, free fluid, or intra-abdominal fluid collection. Appendix not visualized. No retroperitoneal adenopathy. Abdominal aorta is normal in caliber. Moderate atherosclerosis without aneurysm. Within the pelvis the bladder is physiologically distended. Uterus is surgically absent. No adnexal mass. There are no acute or suspicious osseous abnormalities. There is degenerative change throughout spine. Review of the MIP images confirms the above findings. IMPRESSION: 1. Acute bilateral pulmonary emboli involving the segmental branches small to moderate thromboembolic burden. No right heart strain. 2. Heterogeneous enhancement of the left kidney with mild perinephric stranding, raises concern for left pyelonephritis. Recommend correlation with urinalysis. Critical Value/emergent results were called by telephone at the time of interpretation on 04/14/2015 at 11:46 pm to Dr. Delman Kitten , who verbally acknowledged these results. Electronically Signed   By: Jeb Levering M.D.   On: 04/14/2015 23:47   US Venous Img Lower Bilateral  04/15/2015  CLINICAL DATA:  Lower extremity swelling. EXAM: BILATERAL LOWER EXTREMITY VENOUS DOPPLER ULTRASOUND TECHNIQUE: Gray-scale sonography with graded  compression, as well as color Doppler and duplex ultrasound were performed to evaluate the lower extremity deep venous systems from the level of the common femoral vein and including the common femoral, femoral, profunda femoral, popliteal and calf veins including the posterior tibial, peroneal and gastrocnemius veins when visible. The superficial great saphenous vein was also interrogated. Spectral Doppler was utilized to evaluate flow at rest and with distal augmentation maneuvers in the common femoral, femoral and popliteal veins. COMPARISON:  None. FINDINGS: RIGHT LOWER EXTREMITY Common Femoral Vein: No evidence of thrombus. Normal compressibility, respiratory phasicity and response to augmentation. Saphenofemoral Junction: No evidence of thrombus. Normal compressibility and flow on color Doppler imaging. Profunda Femoral Vein: No evidence of thrombus. Normal compressibility and flow on color Doppler imaging. Femoral Vein: No evidence of thrombus. Normal compressibility, respiratory phasicity and response to augmentation. Popliteal Vein: No evidence of thrombus. Normal compressibility, respiratory phasicity and response to augmentation. Calf Veins: No evidence of thrombus. Normal compressibility and flow on color Doppler imaging. Superficial Great Saphenous Vein: No evidence of thrombus. Normal compressibility and flow on  color Doppler imaging. Venous Reflux:  None. Other Findings:  None. LEFT LOWER EXTREMITY Common Femoral Vein: No evidence of thrombus. Normal compressibility, respiratory phasicity and response to augmentation. Saphenofemoral Junction: No evidence of thrombus. Normal compressibility and flow on color Doppler imaging. Profunda Femoral Vein: No evidence of thrombus. Normal compressibility and flow on color Doppler imaging. Femoral Vein: No evidence of thrombus. Normal compressibility, respiratory phasicity and response to augmentation. Popliteal Vein: No evidence of thrombus. Normal  compressibility, respiratory phasicity and response to augmentation. Calf Veins: No evidence of thrombus. Normal compressibility and flow on color Doppler imaging. Superficial Great Saphenous Vein: No evidence of thrombus. Normal compressibility and flow on color Doppler imaging. Venous Reflux:  None. Other Findings:  None. IMPRESSION: No evidence of deep venous thrombosis. Electronically Signed   By: Lajean Manes M.D.   On: 04/15/2015 17:16   Dg Chest Port 1 View  04/14/2015  CLINICAL DATA:  Right-sided chest pain today, shortness of breath. EXAM: PORTABLE CHEST 1 VIEW COMPARISON:  None. FINDINGS: Study is hypoinspiratory with crowding of the perihilar bronchovascular markings. Given the low lung volumes, lungs appear clear. No evidence of pneumonia. No pleural effusion seen. No pneumothorax. Cardiomediastinal silhouette is within normal limits in size and configuration. Osseous and soft tissue structures about the chest are unremarkable. IMPRESSION: Hypoinspiratory exam with low lung volumes. No evidence of acute cardiopulmonary abnormality. Electronically Signed   By: Franki Cabot M.D.   On: 04/14/2015 21:20    EKG:   Orders placed or performed during the hospital encounter of 04/14/15  . EKG 12-Lead  . EKG 12-Lead      Management plans discussed with the patient, family and they are in agreement.  CODE STATUS:     Code Status Orders        Start     Ordered   04/15/15 0000  Full code   Continuous     04/14/15 2359      TOTAL TIME TAKING CARE OF THIS PATIENT: 37 minutes.    Gladstone Lighter M.D on 04/16/2015 at 8:09 AM  Between 7am to 6pm - Pager - [phone]  After 6pm go to [email or web address] - password [email or web address] [facility]  [facility]  Office  [phone]  CC: Primary care physician; [facility], April Noa, MD

## 2015-04-16 NOTE — Care Management Note (Addendum)
Case Management Note  Patient Details  Name: April [name] MRN: [id number] Date of Birth: 1945-07-16  Subjective/Objective:                  Spoke with patient regarding discharge to home today with home health PT. She agrees and states that her deceased husband had used [facility] in the past and would like to use them again. She states she has a rolling walker available at home. Her PCP is [facility] : [phone]. She states she uses [facility] in [place] for [employer] and denies difficulty paying for Rx. She lives alone and states that she has been independent with daily activies- she is not concerned with returning home today.   Action/Plan: Has follow up appointment Dec. 30, 2016 at 11:30AM confirmed. Referral made to [facility] with [facility].Eliquis called in to [phone] Prior auth needed [id number] ID [id number] approved 60 tablets only.Cost $ No further RNCM needs. Case closed.   Expected Discharge Date:                  Expected Discharge Plan:     In-House Referral:     Discharge planning Services  CM Consult  Post Acute Care Choice:  Home Health Choice offered to:  Patient  DME Arranged:  N/A DME Agency:     HH Arranged:  PT [place]:  [facility]  Status of Service:  Completed, signed off  Medicare Important Message Given:    Date Medicare IM Given:    Medicare IM give by:    Date Additional Medicare IM Given:    Additional Medicare Important Message give by:     If discussed at [place] of Stay Meetings, dates discussed:    Additional Comments:  Marshell Garfinkel, RN 04/16/2015, 9:08 AM

## 2015-04-16 NOTE — Progress Notes (Signed)
Pt being discharged today, discharge instructions reviewed with pt and her daughter, paper prescriptions given to pt, both verified understanding. Morning medications given and pt rolled out in wheelchair by staff.

## 2015-04-29 DIAGNOSIS — D6852 Prothrombin gene mutation: Secondary | ICD-10-CM

## 2015-04-29 DIAGNOSIS — Z86718 Personal history of other venous thrombosis and embolism: Secondary | ICD-10-CM

## 2015-04-29 HISTORY — DX: Personal history of other venous thrombosis and embolism: Z86.718

## 2015-04-29 HISTORY — PX: COLONOSCOPY: SHX174

## 2015-04-29 HISTORY — DX: Prothrombin gene mutation: D68.52

## 2015-10-26 ENCOUNTER — Encounter: Payer: Self-pay | Admitting: Oncology

## 2015-10-26 ENCOUNTER — Inpatient Hospital Stay: Payer: Medicare Other | Attending: Oncology | Admitting: Oncology

## 2015-10-26 VITALS — BP 130/76 | HR 62 | Temp 97.6°F | Resp 18 | Wt 189.9 lb

## 2015-10-26 DIAGNOSIS — Z7901 Long term (current) use of anticoagulants: Secondary | ICD-10-CM | POA: Diagnosis not present

## 2015-10-26 DIAGNOSIS — Z79899 Other long term (current) drug therapy: Secondary | ICD-10-CM | POA: Diagnosis not present

## 2015-10-26 DIAGNOSIS — K5730 Diverticulosis of large intestine without perforation or abscess without bleeding: Secondary | ICD-10-CM | POA: Diagnosis not present

## 2015-10-26 DIAGNOSIS — Z86711 Personal history of pulmonary embolism: Secondary | ICD-10-CM | POA: Insufficient documentation

## 2015-10-26 DIAGNOSIS — I2782 Chronic pulmonary embolism: Secondary | ICD-10-CM

## 2015-10-26 NOTE — Progress Notes (Signed)
States is feeling well. Offers no complaints. New evaluation regarding blood clots in lungs that was treated for in December.

## 2015-10-26 NOTE — Progress Notes (Signed)
[place]  [phone] [phone]  ID: Stasi Landmark OB: 1945-10-23  MR#: [id number]  [id number]  Patient Care Team: Sofie Hartigan, MD as PCP - General (Family Medicine)  CHIEF COMPLAINT:  Chief Complaint  Patient presents with  . PE    INTERVAL HISTORY: Patient is a 70 year old female who presented to the emergency room 6 months ago with acute onset pleuritic chest pain. Subsequent workup includes CT scan which revealed multiple bilateral pulmonary embolisms. Patient reports that she was in bed secondary to "Norovirus" for the majority of 3 or 4 approximately one week prior to her symptoms. She essentially placed on Eliquis. Patient is referred to clinic today for further evaluation and whether or not she can discontinue anticoagulation. She currently feels well and is asymptomatic. She denies any easy bleeding or bruising. She has no neurologic complaints. She denies any recent illnesses or fevers. She denies any chest pain, shortness of breath, cough, or hemoptysis. She denies any nausea, vomiting, constipation, or diarrhea. She has no urinary complaints. Patient feels at her baseline and offers no specific complaints today.   REVIEW OF SYSTEMS:   Review of Systems  Constitutional: Negative.  Negative for fever, weight loss and malaise/fatigue.  Respiratory: Negative.  Negative for cough, hemoptysis and shortness of breath.   Cardiovascular: Negative.  Negative for chest pain.  Gastrointestinal: Negative.  Negative for abdominal pain.  Musculoskeletal: Negative.   Neurological: Negative.  Negative for weakness.  Endo/Heme/Allergies: Does not bruise/bleed easily.  Psychiatric/Behavioral: The patient is not nervous/anxious.     As per HPI. Otherwise, a complete review of systems is negatve.  PAST MEDICAL HISTORY: Past Medical History  Diagnosis Date  . Diverticula of colon   . Cardiac abnormality     STENT  . Cataract      PAST SURGICAL HISTORY: Past Surgical History  Procedure Laterality Date  . Cardiac catheterization    . Appendectomy    . Cholecystectomy    . Abdominal hysterectomy      FAMILY HISTORY Family History  Problem Relation Age of Onset  . Stroke Mother   . CAD Father        ADVANCED DIRECTIVES:    HEALTH MAINTENANCE: Social History  Substance Use Topics  . Smoking status: Never Smoker   . Smokeless tobacco: Not on file  . Alcohol Use: No     Colonoscopy:  PAP:  Bone density:  Lipid panel:  Allergies  Allergen Reactions  . Antihistamines, Chlorpheniramine-Type Other (See Comments)    "think crazy thoughts"  . Ciprofloxacin Other (See Comments)    Patient cannot remember reaction  . Dristan Cold [Chlorphen-Pe-Acetaminophen] Other (See Comments)    "think crazy thoughts"  . Ginger Diarrhea    Current Outpatient Prescriptions  Medication Sig Dispense Refill  . acetaminophen (TYLENOL) 325 MG tablet Take 650 mg by mouth every 6 (six) hours as needed.    Marland Kitchen apixaban (ELIQUIS) 5 MG TABS tablet Take 1 tablet (5 mg total) by mouth 2 (two) times daily. Start from 04/22/15 60 tablet 2  . atenolol (TENORMIN) 25 MG tablet Take 1 tablet by mouth daily.    Marland Kitchen atorvastatin (LIPITOR) 40 MG tablet Take 40 mg by mouth daily.    . calcium citrate-vitamin D (CITRACAL+D) 315-200 MG-UNIT tablet Take 1 tablet by mouth 2 (two) times daily.    . Cholecalciferol (VITAMIN D3) 2000 UNITS capsule Take 1 capsule by mouth daily.     No current facility-administered medications for this visit.  OBJECTIVE: Filed Vitals:   10/26/15 1419  BP: 130/76  Pulse: 62  Temp: 97.6 F (36.4 C)  Resp: 18     Body mass index is 29.74 kg/(m^2).    ECOG FS:0 - Asymptomatic  General: Well-developed, well-nourished, no acute distress. Eyes: Pink conjunctiva, anicteric sclera. HEENT: Normocephalic, moist mucous membranes, clear oropharnyx. Lungs: Clear to auscultation bilaterally. Heart: Regular  rate and rhythm. No rubs, murmurs, or gallops. Abdomen: Soft, nontender, nondistended. No organomegaly noted, normoactive bowel sounds. Musculoskeletal: No edema, cyanosis, or clubbing. Neuro: Alert, answering all questions appropriately. Cranial nerves grossly intact. Skin: No rashes or petechiae noted. Psych: Normal affect. Lymphatics: No cervical, calvicular, axillary or inguinal LAD.   LAB RESULTS:  Lab Results  Component Value Date   NA 139 04/16/2015   K 3.7 04/16/2015   CL 109 04/16/2015   CO2 23 04/16/2015   GLUCOSE 109* 04/16/2015   BUN 18 04/16/2015   CREATININE 1.05* 04/16/2015   CALCIUM 9.0 04/16/2015   PROT 7.2 04/14/2015   ALBUMIN 3.3* 04/14/2015   AST 51* 04/14/2015   ALT 91* 04/14/2015   ALKPHOS 73 04/14/2015   BILITOT 1.0 04/14/2015   GFRNONAA 53* 04/16/2015   GFRAA >60 04/16/2015    Lab Results  Component Value Date   WBC 8.0 04/16/2015   HGB 11.1* 04/16/2015   HCT 33.8* 04/16/2015   MCV 89.5 04/16/2015   PLT 337 04/16/2015     STUDIES: No results found.  ASSESSMENT: History of PE with no obvious transient risk factors.  PLAN:    1. PE: Patient has no family history of blood clotting or pulmonary embolism. She has a possible transient risk factor being bedridden with illness for 3 or 4 days. Partial hypercoagulable workup was negative. Patient has now completed 6 months of Eliquis and has been instructed to discontinue her treatment. She will return to clinic on October 31, 2015 for laboratory work in order to complete her hypercoagulable workup. If negative, no further follow-up is necessary and patient expressed understanding that she is at higher risk of developing a second DVT or PE than the general population given her personal history. If there is anything positive on her hypercoagulable workup, patient will return to clinic to discuss the results.  Approximately 45 minutes was spent in discussion of which greater than 50% was  consultation.  Patient expressed understanding and was in agreement with this plan. She also understands that She can call clinic at any time with any questions, concerns, or complaints.   Lloyd Huger, MD   10/26/2015 10:13 PM

## 2015-10-31 ENCOUNTER — Inpatient Hospital Stay: Payer: Medicare Other | Attending: Oncology

## 2015-10-31 DIAGNOSIS — Z7901 Long term (current) use of anticoagulants: Secondary | ICD-10-CM | POA: Diagnosis not present

## 2015-10-31 DIAGNOSIS — Z79899 Other long term (current) drug therapy: Secondary | ICD-10-CM | POA: Diagnosis not present

## 2015-10-31 DIAGNOSIS — K5790 Diverticulosis of intestine, part unspecified, without perforation or abscess without bleeding: Secondary | ICD-10-CM | POA: Insufficient documentation

## 2015-10-31 DIAGNOSIS — Z86711 Personal history of pulmonary embolism: Secondary | ICD-10-CM | POA: Diagnosis not present

## 2015-10-31 DIAGNOSIS — I2782 Chronic pulmonary embolism: Secondary | ICD-10-CM

## 2015-10-31 DIAGNOSIS — D6852 Prothrombin gene mutation: Secondary | ICD-10-CM | POA: Insufficient documentation

## 2015-11-03 LAB — CARDIOLIPIN ANTIBODIES, IGG, IGM, IGA
Anticardiolipin IgA: <9 APL U/mL (ref 0–11)
Anticardiolipin IgG: <9 GPL U/mL (ref 0–14)
Anticardiolipin IgM: <9 MPL U/mL (ref 0–12)

## 2015-11-03 LAB — BETA-2-GLYCOPROTEIN I ABS, IGG/M/A

## 2015-11-03 LAB — LUPUS ANTICOAGULANT PANEL
DRVVT: 35.1 s (ref 0.0–47.0)
PTT Lupus Anticoagulant: 33.4 s (ref 0.0–51.9)

## 2015-11-03 LAB — HOMOCYSTEINE: Homocysteine: 18.9 umol/L — ABNORMAL HIGH (ref 0.0–15.0)

## 2015-11-03 LAB — PROTHROMBIN GENE MUTATION

## 2015-11-05 ENCOUNTER — Ambulatory Visit: Payer: Medicare Other

## 2015-11-25 DIAGNOSIS — D6852 Prothrombin gene mutation: Secondary | ICD-10-CM | POA: Insufficient documentation

## 2015-11-25 NOTE — Progress Notes (Signed)
[place]  [phone] [phone]) [phone]  ID: April [name] OB: [birth year]  MR#: [id number]  [id number]  Patient Care Team: April Hartigan, MD as PCP - General (Family Medicine)  CHIEF COMPLAINT: Heterozygous for prothrombin gene mutation.  INTERVAL HISTORY: Patient returns to clinic today for further evaluation, discussion of her laboratory work, and consideration of continuation of Eliquis. She currently feels well and is asymptomatic. She denies any easy bleeding or bruising. She has no neurologic complaints. She denies any recent illnesses or fevers. She denies any chest pain, shortness of breath, cough, or hemoptysis. She denies any nausea, vomiting, constipation, or diarrhea. She has no urinary complaints. Patient feels at her baseline and offers no specific complaints today.   REVIEW OF SYSTEMS:   Review of Systems  Constitutional: Negative.  Negative for fever, malaise/fatigue and weight loss.  Respiratory: Negative.  Negative for cough, hemoptysis and shortness of breath.   Cardiovascular: Negative.  Negative for chest pain.  Gastrointestinal: Negative.  Negative for abdominal pain.  Musculoskeletal: Negative.   Neurological: Negative.  Negative for weakness.  Endo/Heme/Allergies: Does not bruise/bleed easily.  Psychiatric/Behavioral: The patient is not nervous/anxious.     As per HPI. Otherwise, a complete review of systems is negatve.  PAST MEDICAL HISTORY: Past Medical History:  Diagnosis Date  . Cardiac abnormality    STENT  . Cataract   . Diverticula of colon     PAST SURGICAL HISTORY: Past Surgical History:  Procedure Laterality Date  . ABDOMINAL HYSTERECTOMY    . APPENDECTOMY    . CARDIAC CATHETERIZATION    . CHOLECYSTECTOMY      FAMILY HISTORY Family History  Problem Relation Age of Onset  . Stroke Mother   . CAD Father        ADVANCED DIRECTIVES:    HEALTH MAINTENANCE: Social History  Substance  Use Topics  . Smoking status: Never Smoker  . Smokeless tobacco: Not on file  . Alcohol use No     Colonoscopy:  PAP:  Bone density:  Lipid panel:  Allergies  Allergen Reactions  . Antihistamines, Chlorpheniramine-Type Other (See Comments)    "think crazy thoughts"  . Ciprofloxacin Other (See Comments)    Patient cannot remember reaction  . Dristan Cold [Chlorphen-Pe-Acetaminophen] Other (See Comments)    "think crazy thoughts"  . Ginger Diarrhea    Current Outpatient Prescriptions  Medication Sig Dispense Refill  . acetaminophen (TYLENOL) 325 MG tablet Take 650 mg by mouth every 6 (six) hours as needed.    Marland Kitchen apixaban (ELIQUIS) 5 MG TABS tablet Take 1 tablet (5 mg total) by mouth 2 (two) times daily. Start from 04/22/15 60 tablet 2  . atenolol (TENORMIN) 25 MG tablet Take 1 tablet by mouth daily.    Marland Kitchen atorvastatin (LIPITOR) 40 MG tablet Take 40 mg by mouth daily.    . calcium citrate-vitamin D (CITRACAL+D) 315-200 MG-UNIT tablet Take 1 tablet by mouth 2 (two) times daily.    . Cholecalciferol (VITAMIN D3) 2000 UNITS capsule Take 1 capsule by mouth daily.     No current facility-administered medications for this visit.     OBJECTIVE: There were no vitals filed for this visit.   There is no height or weight on file to calculate BMI.    ECOG FS:0 - Asymptomatic  General: Well-developed, well-nourished, no acute distress. Eyes: Pink conjunctiva, anicteric sclera. Lungs: Clear to auscultation bilaterally. Heart: Regular rate and rhythm. No rubs, murmurs, or gallops. Abdomen: Soft, nontender, nondistended. No organomegaly noted,  normoactive bowel sounds. Musculoskeletal: No edema, cyanosis, or clubbing. Neuro: Alert, answering all questions appropriately. Cranial nerves grossly intact. Skin: No rashes or petechiae noted. Psych: Normal affect.   LAB RESULTS:  Lab Results  Component Value Date   NA 139 04/16/2015   K 3.7 04/16/2015   CL 109 04/16/2015   CO2 23  04/16/2015   GLUCOSE 109 (H) 04/16/2015   BUN 18 04/16/2015   CREATININE 1.05 (H) 04/16/2015   CALCIUM 9.0 04/16/2015   PROT 7.2 04/14/2015   ALBUMIN 3.3 (L) 04/14/2015   AST 51 (H) 04/14/2015   ALT 91 (H) 04/14/2015   ALKPHOS 73 04/14/2015   BILITOT 1.0 04/14/2015   GFRNONAA 53 (L) 04/16/2015   GFRAA >60 04/16/2015    Lab Results  Component Value Date   WBC 8.0 04/16/2015   HGB 11.1 (L) 04/16/2015   HCT 33.8 (L) 04/16/2015   MCV 89.5 04/16/2015   PLT 337 04/16/2015     STUDIES: No results found.  ASSESSMENT: Heterozygous for prothrombin gene mutation.  PLAN:    1. Heterozygous for prothrombin gene mutation: Patient completed 6 months of Eliquis. With this gene mutation, she is at approximately 2-3 times risk over the general population to develop a second blood clot. After lengthy discussion with the patient, she wishes to stay off anticoagulation but understands she has a second clot she will require lifelong treatment. Patient was educated on transient risk factors that increase her risk significantly including surgery and travel. No intervention is needed at this time. No follow-up is necessary.   Approximately 30 minutes was spent in discussion of which greater than 50% was consultation.  Patient expressed understanding and was in agreement with this plan. She also understands that She can call clinic at any time with any questions, concerns, or complaints.   Lloyd Huger, MD   11/25/2015 3:04 PM

## 2015-11-26 ENCOUNTER — Encounter (INDEPENDENT_AMBULATORY_CARE_PROVIDER_SITE_OTHER): Payer: Self-pay

## 2015-11-26 ENCOUNTER — Inpatient Hospital Stay (HOSPITAL_BASED_OUTPATIENT_CLINIC_OR_DEPARTMENT_OTHER): Payer: Medicare Other | Admitting: Oncology

## 2015-11-26 VITALS — BP 138/84 | HR 97 | Temp 95.8°F | Resp 16 | Ht 67.0 in | Wt 188.7 lb

## 2015-11-26 DIAGNOSIS — D6852 Prothrombin gene mutation: Secondary | ICD-10-CM

## 2015-11-26 DIAGNOSIS — Z86711 Personal history of pulmonary embolism: Secondary | ICD-10-CM | POA: Diagnosis not present

## 2015-11-26 DIAGNOSIS — I2699 Other pulmonary embolism without acute cor pulmonale: Secondary | ICD-10-CM

## 2015-11-26 DIAGNOSIS — K5790 Diverticulosis of intestine, part unspecified, without perforation or abscess without bleeding: Secondary | ICD-10-CM

## 2015-11-26 DIAGNOSIS — Z79899 Other long term (current) drug therapy: Secondary | ICD-10-CM

## 2015-11-26 NOTE — Progress Notes (Signed)
No changes since last visit.  Stopped Eliquis last visit.

## 2016-02-05 ENCOUNTER — Other Ambulatory Visit: Payer: Self-pay | Admitting: Obstetrics and Gynecology

## 2016-02-05 DIAGNOSIS — Z1231 Encounter for screening mammogram for malignant neoplasm of breast: Secondary | ICD-10-CM

## 2016-03-04 ENCOUNTER — Ambulatory Visit
Admission: RE | Admit: 2016-03-04 | Discharge: 2016-03-04 | Disposition: A | Discharge status: Home / Self Care | Payer: Medicare Other | Source: Ambulatory Visit | Attending: Obstetrics and Gynecology | Admitting: Obstetrics and Gynecology

## 2016-03-04 DIAGNOSIS — Z1231 Encounter for screening mammogram for malignant neoplasm of breast: Secondary | ICD-10-CM | POA: Diagnosis present

## 2016-08-19 ENCOUNTER — Other Ambulatory Visit: Payer: Self-pay | Admitting: Podiatry

## 2016-08-19 ENCOUNTER — Ambulatory Visit
Admission: RE | Admit: 2016-08-19 | Discharge: 2016-08-19 | Disposition: A | Discharge status: Home / Self Care | Payer: Medicare Other | Source: Ambulatory Visit | Attending: Podiatry | Admitting: Podiatry

## 2016-08-19 DIAGNOSIS — M79604 Pain in right leg: Secondary | ICD-10-CM

## 2016-08-19 DIAGNOSIS — Z86718 Personal history of other venous thrombosis and embolism: Secondary | ICD-10-CM | POA: Diagnosis present

## 2017-02-10 ENCOUNTER — Other Ambulatory Visit: Payer: Self-pay | Admitting: Obstetrics and Gynecology

## 2017-02-10 DIAGNOSIS — Z1231 Encounter for screening mammogram for malignant neoplasm of breast: Secondary | ICD-10-CM

## 2017-03-06 ENCOUNTER — Ambulatory Visit
Admission: RE | Admit: 2017-03-06 | Discharge: 2017-03-06 | Disposition: A | Discharge status: Home / Self Care | Payer: Medicare Other | Source: Ambulatory Visit | Attending: Obstetrics and Gynecology | Admitting: Obstetrics and Gynecology

## 2017-03-06 DIAGNOSIS — Z1231 Encounter for screening mammogram for malignant neoplasm of breast: Secondary | ICD-10-CM | POA: Diagnosis not present

## 2017-03-09 ENCOUNTER — Other Ambulatory Visit: Payer: Self-pay | Admitting: Obstetrics and Gynecology

## 2017-03-09 DIAGNOSIS — R921 Mammographic calcification found on diagnostic imaging of breast: Secondary | ICD-10-CM

## 2017-03-09 DIAGNOSIS — R928 Other abnormal and inconclusive findings on diagnostic imaging of breast: Secondary | ICD-10-CM

## 2017-03-25 ENCOUNTER — Ambulatory Visit
Admission: RE | Admit: 2017-03-25 | Discharge: 2017-03-25 | Disposition: A | Discharge status: Home / Self Care | Payer: Medicare Other | Source: Ambulatory Visit | Attending: Obstetrics and Gynecology | Admitting: Obstetrics and Gynecology

## 2017-03-25 DIAGNOSIS — R928 Other abnormal and inconclusive findings on diagnostic imaging of breast: Secondary | ICD-10-CM

## 2017-03-25 DIAGNOSIS — R921 Mammographic calcification found on diagnostic imaging of breast: Secondary | ICD-10-CM

## 2017-05-18 ENCOUNTER — Encounter: Payer: Self-pay | Admitting: General Surgery

## 2017-05-20 ENCOUNTER — Encounter: Payer: Self-pay | Admitting: *Deleted

## 2017-05-26 ENCOUNTER — Encounter: Payer: Self-pay | Admitting: General Surgery

## 2017-05-26 ENCOUNTER — Ambulatory Visit (INDEPENDENT_AMBULATORY_CARE_PROVIDER_SITE_OTHER): Payer: Medicare Other | Admitting: General Surgery

## 2017-05-26 VITALS — BP 130/74 | HR 68 | Resp 14 | Ht 67.0 in | Wt 190.0 lb

## 2017-05-26 DIAGNOSIS — R920 Mammographic microcalcification found on diagnostic imaging of breast: Secondary | ICD-10-CM | POA: Diagnosis not present

## 2017-05-26 NOTE — Progress Notes (Signed)
Patient ID: April [name], female   DOB: 09-18-1945, 72 y.o.   MRN: [id number]  Chief Complaint  Patient presents with  . Breast Problem    HPI April [name] is a 72 y.o. female.  who presents for a breast evaluation and second opinion. The most recent mammogram was done on 03-06-17 and right mammogram on 03-25-18. She could not feel anything different in the breast. Denies any injury or trauma. Patient does perform regular self breast checks and gets regular mammograms done.  She has a sore throat from a cold. She is here with niece, April [name]. She is retired from [employer].  HPI  Past Medical History:  Diagnosis Date  . Cardiac abnormality    STENT  . Cataract   . Diverticula of colon   . Endometriosis   . Hx of blood clots 2017   [facility]    Past Surgical History:  Procedure Laterality Date  . ABDOMINAL HYSTERECTOMY    . APPENDECTOMY    . CARDIAC CATHETERIZATION  2005  . CHOLECYSTECTOMY    . COLONOSCOPY  2017  . FOOT SURGERY      Family History  Problem Relation Age of Onset  . Stroke Mother   . CAD Father   . Colon cancer Other        paternal great grandfather  . Breast cancer Neg Hx     Social History Social History   Tobacco Use  . Smoking status: Never Smoker  . Smokeless tobacco: Never Used  Substance Use Topics  . Alcohol use: No  . Drug use: No    Allergies  Allergen Reactions  . Antihistamines, Chlorpheniramine-Type Other (See Comments)    "think crazy thoughts"  . Ciprofloxacin Other (See Comments)    Patient cannot remember reaction  . Dristan Cold [Chlorphen-Pe-Acetaminophen] Other (See Comments)    "think crazy thoughts"  . Ginger Diarrhea    Current Outpatient Medications  Medication Sig Dispense Refill  . acetaminophen (TYLENOL) 325 MG tablet Take 650 mg by mouth every 6 (six) hours as needed.    Marland Kitchen aspirin EC 81 MG tablet Take 81 mg by mouth.    Marland Kitchen atenolol (TENORMIN) 25 MG tablet Take 1 tablet by mouth daily.    Marland Kitchen  atorvastatin (LIPITOR) 40 MG tablet Take 40 mg by mouth daily.    . calcium citrate-vitamin D (CITRACAL+D) 315-200 MG-UNIT tablet Take 1 tablet by mouth 2 (two) times daily.    . Cholecalciferol (VITAMIN D3) 2000 UNITS capsule Take 1 capsule by mouth daily.     No current facility-administered medications for this visit.     Review of Systems Review of Systems  Constitutional: Negative.   Respiratory: Negative.   Cardiovascular: Negative.     Blood pressure 130/74, pulse 68, resp. rate 14, height 5\' 7"  (1.702 m), weight 190 lb (86.2 kg).  Physical Exam Physical Exam  Constitutional: She is oriented to person, place, and time. She appears well-developed and well-nourished.  HENT:  Mouth/Throat: Oropharynx is clear and moist.  Eyes: Conjunctivae are normal. No scleral icterus.  Neck: Neck supple.  Cardiovascular: Normal rate, regular rhythm and normal heart sounds.  Pulmonary/Chest: Effort normal and breath sounds normal. Right breast exhibits no inverted nipple, no mass, no nipple discharge, no skin change and no tenderness. Left breast exhibits no inverted nipple, no mass, no nipple discharge, no skin change and no tenderness.  Left breast > right breast.  Lymphadenopathy:    She has no cervical adenopathy.  She has no axillary adenopathy.  Neurological: She is alert and oriented to person, place, and time.  Skin: Skin is warm and dry.  Psychiatric: Her behavior is normal.    Data Reviewed Mammograms of February 23, 2014 through March 25, 2017 were reviewed.  Focal density in the retroareolar area bilaterally.  In the breast there is a more prominent area of microcalcifications evident on the 2018 films.  In the 2015 films when the tissue was even more dense there is a suggestion of a few calcifications in the same area.  Radiologist recommended a 44-month follow-up, BI-RADS 3.  Assessment    Focal 4 mm area of microcalcifications in the right breast.    Plan     Options for management were reviewed including early biopsy versus a 68-month follow-up (due May 2019) is recommended by the radiologist.  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  A 6-month follow-up we will not change her outlook either way.  She is very comfortable with the idea of observation and at this time will make arrangements for a repeat exam in May 2019 with an office visit to follow.   Patient to have a right diagnostic mammogram follow up in 4 months.  This will be scheduled through our office.     HPI, Physical Exam, Assessment and Plan have been scribed under the direction and in the presence of Robert Bellow, MD. April Fetch, RN I have completed the exam and reviewed the above documentation for accuracy and completeness.  I agree with the above.  Haematologist has been used and any errors in dictation or transcription are unintentional.  Hervey Ard, M.D., F.A.C.S.   April [name] 05/27/2017, 11:06 AM

## 2017-05-26 NOTE — Patient Instructions (Addendum)
The patient is aware to call back for any questions or new concerns.  Patient to have a right diagnostic mammogram follow up in 4 months.

## 2017-05-27 ENCOUNTER — Encounter: Payer: Self-pay | Admitting: General Surgery

## 2017-05-27 DIAGNOSIS — R920 Mammographic microcalcification found on diagnostic imaging of breast: Secondary | ICD-10-CM | POA: Insufficient documentation

## 2017-07-16 ENCOUNTER — Other Ambulatory Visit: Payer: Self-pay

## 2017-07-16 DIAGNOSIS — R920 Mammographic microcalcification found on diagnostic imaging of breast: Secondary | ICD-10-CM

## 2017-09-09 ENCOUNTER — Ambulatory Visit
Admission: RE | Admit: 2017-09-09 | Discharge: 2017-09-09 | Disposition: A | Discharge status: Home / Self Care | Payer: Medicare Other | Source: Ambulatory Visit | Attending: General Surgery | Admitting: General Surgery

## 2017-09-09 ENCOUNTER — Other Ambulatory Visit: Payer: Medicare Other

## 2017-09-09 DIAGNOSIS — R920 Mammographic microcalcification found on diagnostic imaging of breast: Secondary | ICD-10-CM

## 2017-09-15 ENCOUNTER — Encounter: Payer: Self-pay | Admitting: General Surgery

## 2017-09-15 ENCOUNTER — Ambulatory Visit (INDEPENDENT_AMBULATORY_CARE_PROVIDER_SITE_OTHER): Payer: Medicare Other | Admitting: General Surgery

## 2017-09-15 VITALS — BP 156/74 | HR 61 | Resp 18 | Ht 67.0 in | Wt 187.0 lb

## 2017-09-15 DIAGNOSIS — R920 Mammographic microcalcification found on diagnostic imaging of breast: Secondary | ICD-10-CM | POA: Diagnosis not present

## 2017-09-15 NOTE — Patient Instructions (Addendum)
The patient has been asked to return to the office in six months  with a bilateral diagnostic mammogram. The patient is aware to call back for any questions or concerns.  

## 2017-09-15 NOTE — Progress Notes (Signed)
Patient ID: April [name], female   DOB: 10-Nov-1945, 72 y.o.   MRN: [id number]  Chief Complaint  Patient presents with  . Follow-up    HPI April [name] is a 72 y.o. female.  who presents for a breast evaluation. The most recent mammogram was done on 09-09-17.  Patient does perform regular self breast checks and gets regular mammograms done.   No new breast issues.  HPI  Past Medical History:  Diagnosis Date  . Cardiac abnormality    STENT  . Cataract   . Diverticula of colon   . Endometriosis   . Hx of blood clots 2017   [facility]  . Prothrombin gene mutation ([place]) 2017   Heterozygous, 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  [name], MD    Past Surgical History:  Procedure Laterality Date  . ABDOMINAL HYSTERECTOMY    . APPENDECTOMY    . CARDIAC CATHETERIZATION  2005  . CHOLECYSTECTOMY    . COLONOSCOPY  2017  . FOOT SURGERY      Family History  Problem Relation Age of Onset  . Stroke Mother   . CAD Father   . Colon cancer Other        paternal great grandfather  . Breast cancer Neg Hx     Social History Social History   Tobacco Use  . Smoking status: Never Smoker  . Smokeless tobacco: Never Used  Substance Use Topics  . Alcohol use: No  . Drug use: No    Allergies  Allergen Reactions  . Antihistamines, Chlorpheniramine-Type Other (See Comments)    "think crazy thoughts"  . Ciprofloxacin Other (See Comments)    Patient cannot remember reaction  . Dristan Cold [Chlorphen-Pe-Acetaminophen] Other (See Comments)    "think crazy thoughts"  . Ginger Diarrhea    Current Outpatient Medications  Medication Sig Dispense Refill  . acetaminophen (TYLENOL) 325 MG tablet Take 650 mg by mouth every 6 (six) hours as needed.    Marland Kitchen aspirin EC 81 MG tablet Take 81 mg by mouth.    Marland Kitchen atenolol (TENORMIN) 25 MG tablet Take 1 tablet by mouth daily.    Marland Kitchen atorvastatin (LIPITOR) 40 MG tablet Take 40 mg by mouth daily.    . calcium citrate-vitamin D (CITRACAL+D) 315-200  MG-UNIT tablet Take 1 tablet by mouth 2 (two) times daily.    . Cholecalciferol (VITAMIN D3) 2000 UNITS capsule Take 1 capsule by mouth daily.     No current facility-administered medications for this visit.     Review of Systems Review of Systems  Constitutional: Negative.   Respiratory: Negative.   Cardiovascular: Negative.     Blood pressure (!) 156/74, pulse 61, resp. rate 18, height 5\' 7"  (1.702 m), weight 187 lb (84.8 kg), SpO2 98 %.  Physical Exam Physical Exam  Constitutional: She is oriented to person, place, and time. She appears well-developed and well-nourished.  Eyes: Conjunctivae are normal. No scleral icterus.  Neck: Neck supple.  Cardiovascular: Normal rate, regular rhythm and normal heart sounds.  Pulmonary/Chest: Effort normal and breath sounds normal. Right breast exhibits no inverted nipple, no mass, no nipple discharge, no skin change and no tenderness. Left breast exhibits no inverted nipple, no mass, no nipple discharge, no skin change and no tenderness.  Lymphadenopathy:    She has no cervical adenopathy.    She has no axillary adenopathy.  Neurological: She is alert and oriented to person, place, and time.  Skin: Skin is warm and dry.    Data  Reviewed Right breast mammogram dated Sep 09, 2017 reviewed.  BI-RADS 3.  Stable microcalcifications.  Assessment    No interval change in microcalcifications of the right breast.    Plan The patient has been asked to return to the office in six months with a bilateral diagnostic mammogram. The patient is aware to call back for any questions or concerns.   HPI, Physical Exam, Assessment and Plan have been scribed under the direction and in the presence of Hervey Ard, MD.  Gaspar Cola, CMA  I have completed the exam and reviewed the above documentation for accuracy and completeness.  I agree with the above.  Haematologist has been used and any errors in dictation or transcription are  unintentional.  Hervey Ard, M.D., F.A.C.S.   Forest Gleason Kaneesha Constantino 09/15/2017, 7:20 PM

## 2018-01-11 DIAGNOSIS — E785 Hyperlipidemia, unspecified: Secondary | ICD-10-CM | POA: Insufficient documentation

## 2018-01-22 ENCOUNTER — Other Ambulatory Visit: Payer: Self-pay

## 2018-01-22 DIAGNOSIS — R920 Mammographic microcalcification found on diagnostic imaging of breast: Secondary | ICD-10-CM

## 2018-03-10 ENCOUNTER — Ambulatory Visit
Admission: RE | Admit: 2018-03-10 | Discharge: 2018-03-10 | Disposition: A | Discharge status: Home / Self Care | Payer: Medicare Other | Source: Ambulatory Visit | Attending: General Surgery | Admitting: General Surgery

## 2018-03-10 DIAGNOSIS — R920 Mammographic microcalcification found on diagnostic imaging of breast: Secondary | ICD-10-CM | POA: Diagnosis present

## 2018-03-16 ENCOUNTER — Other Ambulatory Visit: Payer: Self-pay

## 2018-03-16 ENCOUNTER — Encounter: Payer: Self-pay | Admitting: General Surgery

## 2018-03-16 ENCOUNTER — Ambulatory Visit (INDEPENDENT_AMBULATORY_CARE_PROVIDER_SITE_OTHER): Payer: Medicare Other | Admitting: General Surgery

## 2018-03-16 VITALS — BP 126/73 | HR 61 | Temp 97.5°F | Resp 16 | Ht 67.0 in | Wt 177.0 lb

## 2018-03-16 DIAGNOSIS — R920 Mammographic microcalcification found on diagnostic imaging of breast: Secondary | ICD-10-CM

## 2018-03-16 NOTE — Progress Notes (Signed)
Patient ID: April [name], female   DOB: [birth year], 72 y.o.   MRN: [id number]  Chief Complaint  Patient presents with  . Follow-up    HPI April [name] is a 72 y.o. female.  who presents for her follow up right breast calcifications and a breast evaluation. The most recent mammogram was done on 03-10-18.  Patient does perform regular self breast checks and gets regular mammograms done.  No new breast issues. She is here with her niece, Jenny Reichmann.  HPI  Past Medical History:  Diagnosis Date  . Cardiac abnormality    STENT  . Cataract   . Diverticula of colon   . Endometriosis   . Hx of blood clots 2017   [facility]  . 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 ABDOMINAL HYSTERECTOMY    . APPENDECTOMY    . CARDIAC CATHETERIZATION  2005  . CHOLECYSTECTOMY    . COLONOSCOPY  2017  . FOOT SURGERY      Family History  Problem Relation Age of Onset  . Stroke Mother   . CAD Father   . Colon cancer Other        paternal great grandfather  . Breast cancer Neg Hx     Social History Social History   Tobacco Use  . Smoking status: Never Smoker  . Smokeless tobacco: Never Used  Substance Use Topics  . Alcohol use: No  . Drug use: No    Allergies  Allergen Reactions  . Antihistamines, Chlorpheniramine-Type Other (See Comments)    "think crazy thoughts"  . Ciprofloxacin Other (See Comments)    Patient cannot remember reaction  . Dristan Cold [Chlorphen-Pe-Acetaminophen] Other (See Comments)    "think crazy thoughts"  . Ginger Diarrhea    Current Outpatient Medications  Medication Sig Dispense Refill  . acetaminophen (TYLENOL) 325 MG tablet Take 500 mg by mouth every 6 (six) hours as needed.     Marland Kitchen aspirin EC 81 MG tablet Take 81 mg by mouth.    Marland Kitchen atenolol (TENORMIN) 25 MG tablet Take 1 tablet by mouth daily.    Marland Kitchen atorvastatin (LIPITOR) 40 MG  tablet Take 40 mg by mouth daily.    . calcium citrate-vitamin D (CITRACAL+D) 315-200 MG-UNIT tablet Take 1 tablet by mouth 2 (two) times daily.    . Cholecalciferol (VITAMIN D3) 2000 UNITS capsule Take 1 capsule by mouth daily.     No current facility-administered medications for this visit.     Review of Systems Review of Systems  Constitutional: Negative.   Respiratory: Negative.   Cardiovascular: Negative.     Blood pressure 126/73, pulse 61, temperature (!) 97.5 F (36.4 C), temperature source Skin, resp. rate 16, height 5\' 7"  (1.702 m), weight 177 lb (80.3 kg), SpO2 98 %.  Physical Exam Physical Exam  Constitutional: She is oriented to person, place, and time. She appears well-developed and well-nourished.  HENT:  Mouth/Throat: Oropharynx is clear and moist.  Eyes: Conjunctivae are normal. No scleral icterus.  Neck: Neck supple.  Cardiovascular: Normal rate, regular rhythm and normal heart sounds.  Pulmonary/Chest: Effort normal and breath sounds normal. Right breast exhibits no inverted nipple, no mass, no nipple discharge, no skin change and no tenderness. Left breast exhibits no inverted nipple, no mass, no nipple discharge, no skin change and no tenderness.  Left breast > right breast, unchanged.   Lymphadenopathy:  She has no cervical adenopathy.    She has no axillary adenopathy.  Neurological: She is alert and oriented to person, place, and time.  Skin: Skin is warm and dry.  Psychiatric: Her behavior is normal.    Data Reviewed Bilateral diagnostic mammograms dated March 10, 2018 reviewed.  No interval change.  BI-RADS-3.  1 year follow-up bilateral diagnostic mammogram is recommended.  Assessment    Stable breast exam.    Plan    Patient will be asked to return to the office in one year with a bilateral diagnostic mammogram. The patient is aware to call back for any questions or new concerns.      HPI, Physical Exam, Assessment and Plan have  been scribed under the direction and in the presence of Robert Bellow, MD. Karie Fetch, RN  I have completed the exam and reviewed the above documentation for accuracy and completeness.  I agree with the above.  Haematologist has been used and any errors in dictation or transcription are unintentional.  Hervey Ard, M.D., F.A.C.S.  Forest Gleason Kinan Safley 03/17/2018, 9:00 PM

## 2018-03-16 NOTE — Patient Instructions (Addendum)
Patient will be asked to return to the office in one year with a bilateral diagnostic mammogram. The patient is aware to call back for any questions or new concerns.

## 2018-08-30 DIAGNOSIS — E7800 Pure hypercholesterolemia, unspecified: Secondary | ICD-10-CM | POA: Diagnosis not present

## 2018-08-30 DIAGNOSIS — M129 Arthropathy, unspecified: Secondary | ICD-10-CM | POA: Diagnosis not present

## 2018-08-30 DIAGNOSIS — I10 Essential (primary) hypertension: Secondary | ICD-10-CM | POA: Diagnosis not present

## 2018-08-30 DIAGNOSIS — Z23 Encounter for immunization: Secondary | ICD-10-CM | POA: Diagnosis not present

## 2018-08-30 DIAGNOSIS — Z0000 Encounter for general adult medical examination without abnormal findings: Secondary | ICD-10-CM | POA: Diagnosis not present

## 2018-08-30 DIAGNOSIS — I2510 Atherosclerotic heart disease of native coronary artery without angina pectoris: Secondary | ICD-10-CM | POA: Diagnosis not present

## 2018-10-02 IMAGING — US US EXTREM LOW VENOUS*R*
1 series · 13 of 24 positions shown · non-contrast
Comparison: None.

CLINICAL DATA: Right leg pain for 4 days.



[Series 1: us extrem low venous*right* · 13 of 40 slices shown]
[im 1/40]
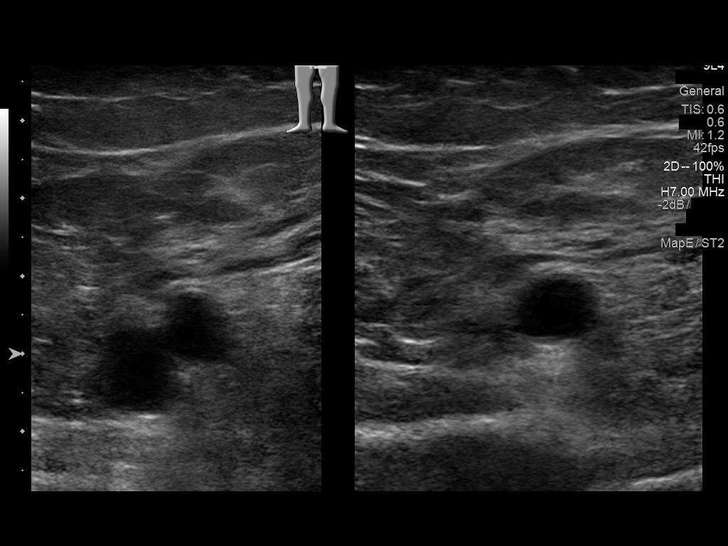
[im 4/40]
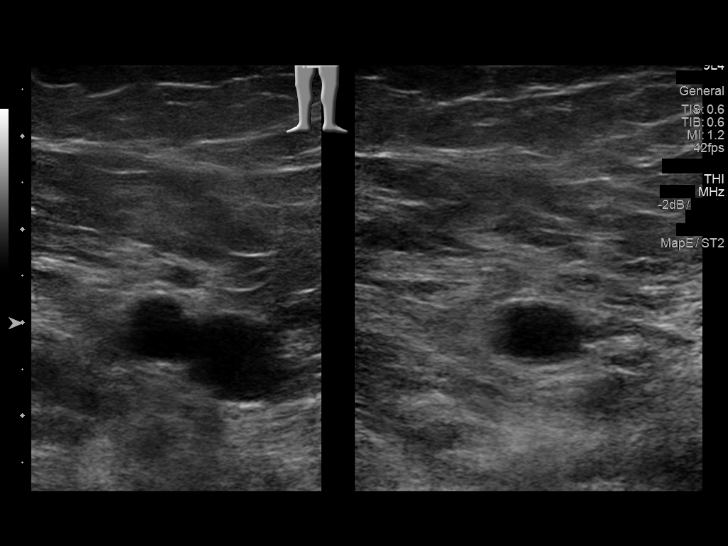
[im 7/40]
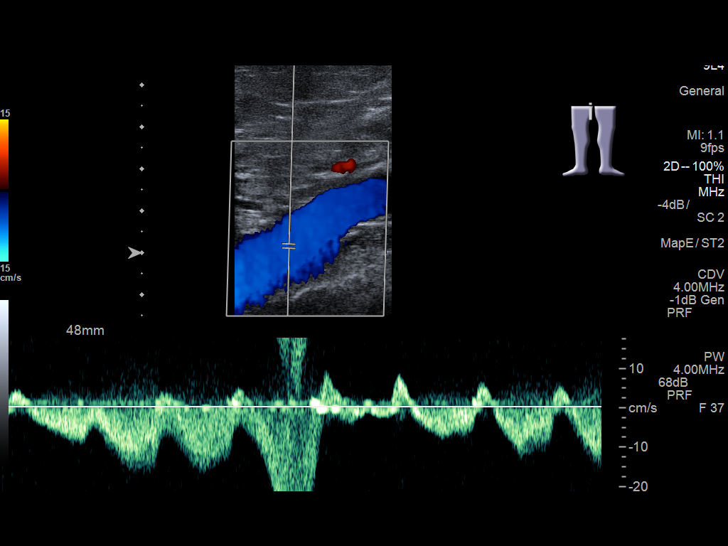
[im 11/40]
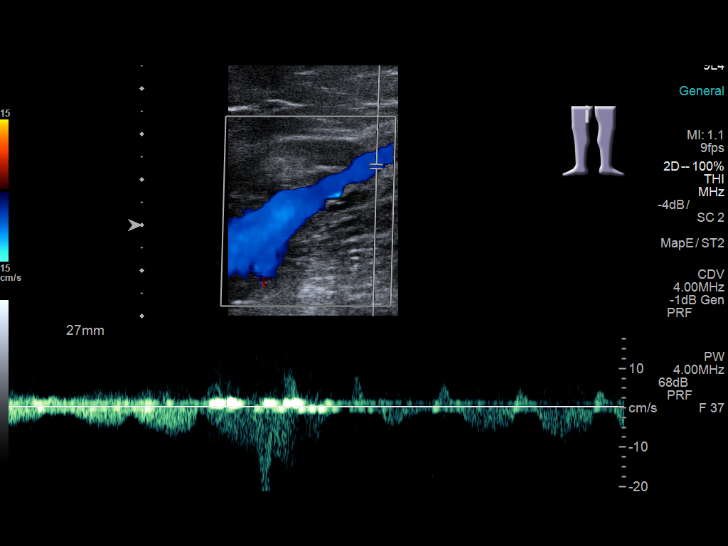
[im 14/40]
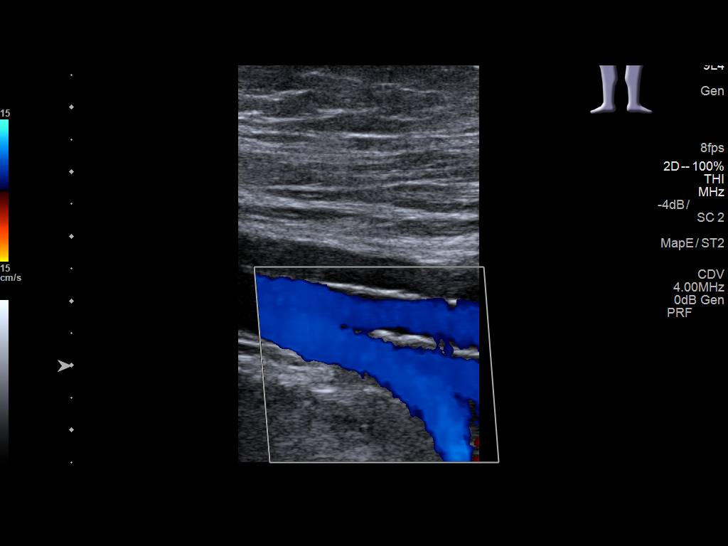
[im 17/40]
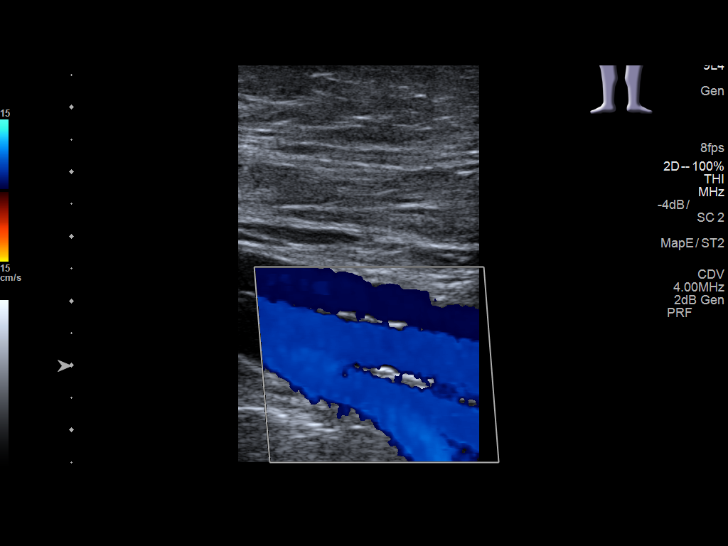
[im 21/40]
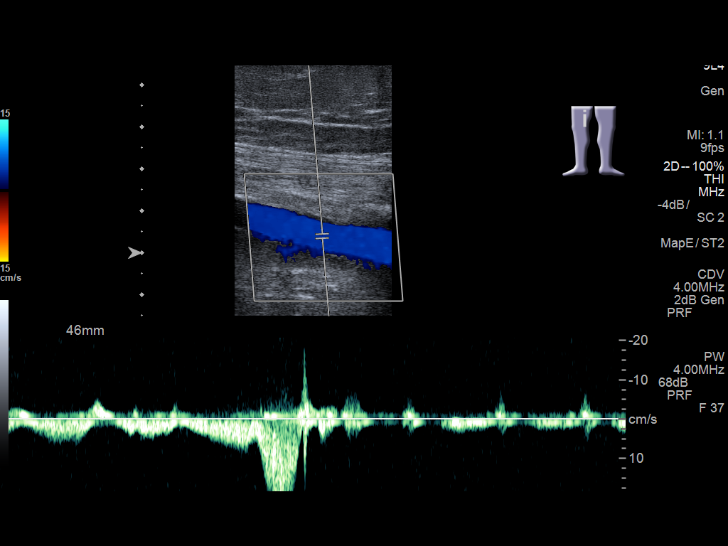
[im 23/40]
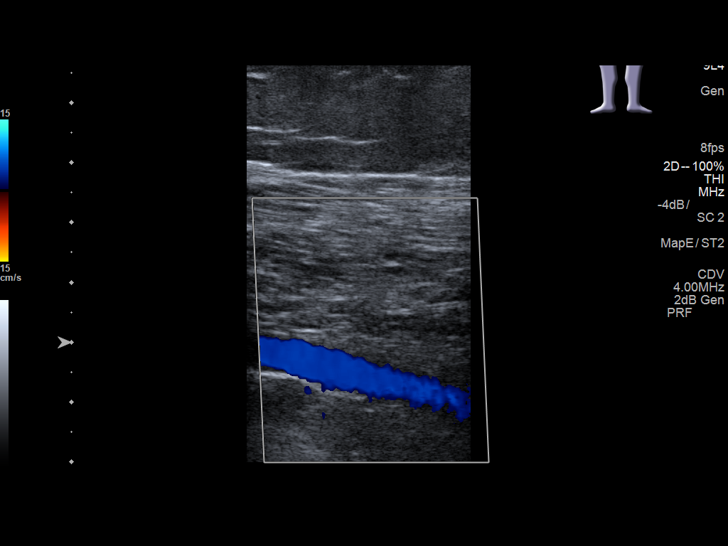
[im 26/40]
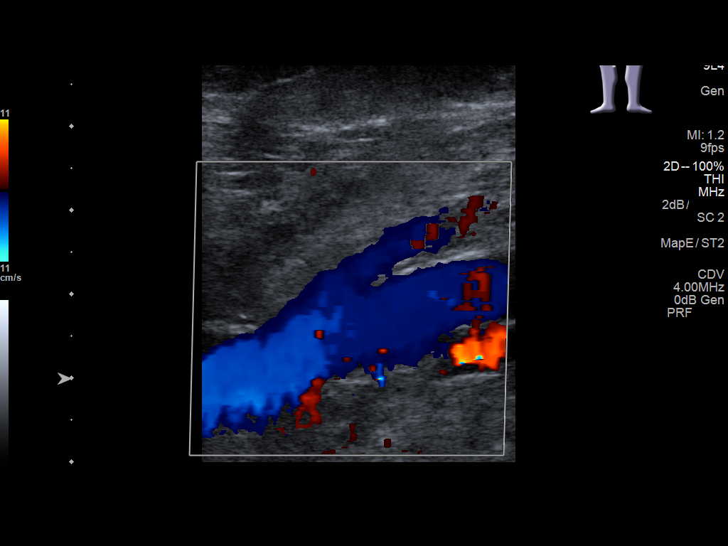
[im 29/40]
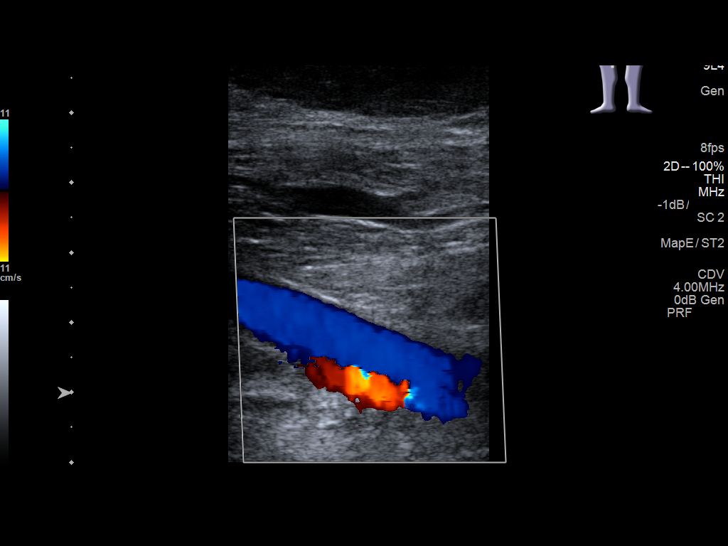
[im 33/40]
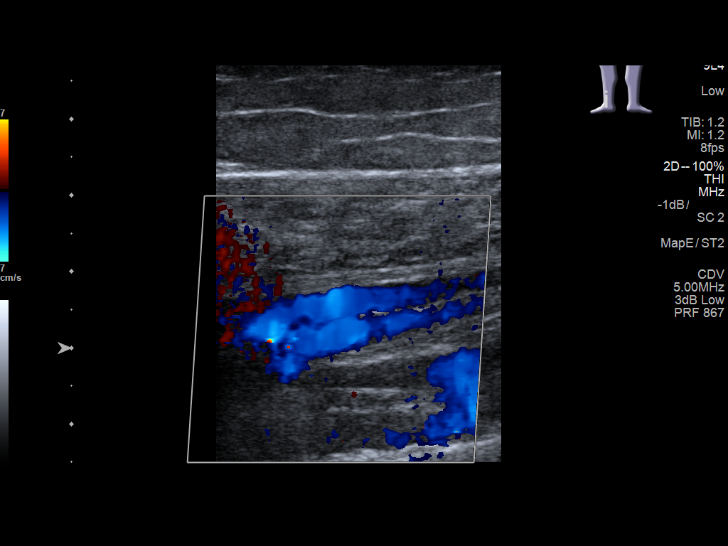
[im 36/40]
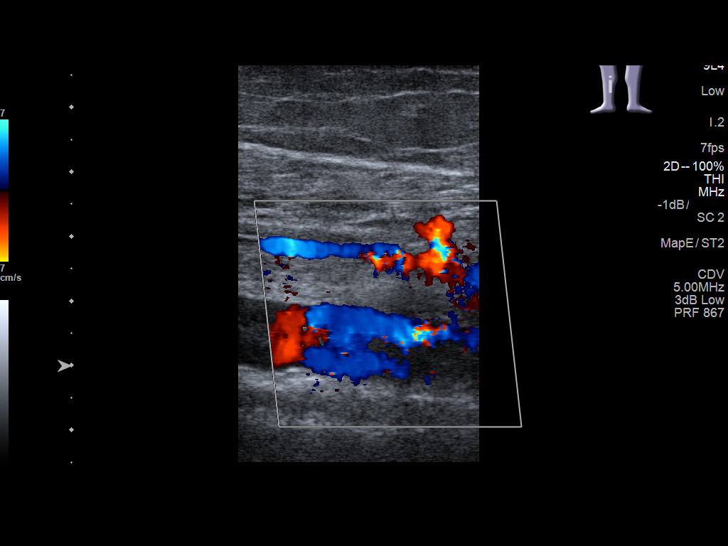
[im 40/40]
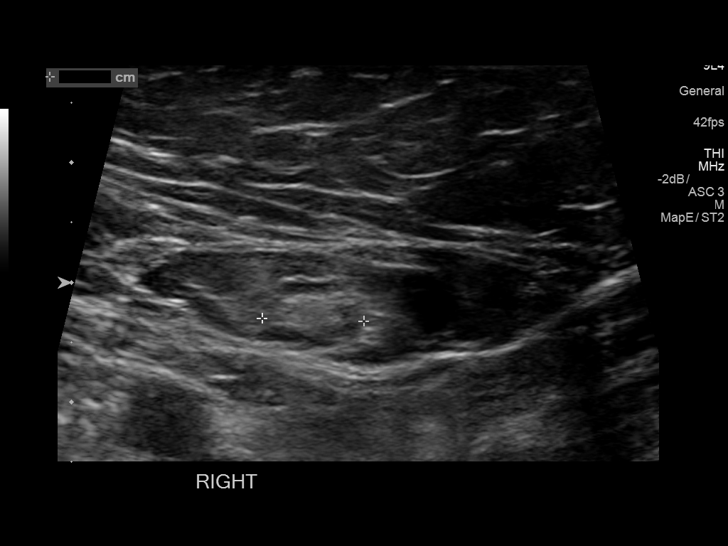

[13 of 24 positions shown; findings below may reference images not displayed]

FINDINGS: Contralateral Common Femoral Vein: Respiratory phasicity is normal
and symmetric with the symptomatic side. No evidence of thrombus.
Normal compressibility.

Common Femoral Vein: No evidence of thrombus. Normal
compressibility, respiratory phasicity and response to augmentation.

Saphenofemoral Junction: No evidence of thrombus. Normal
compressibility and flow on color Doppler imaging.

Profunda Femoral Vein: No evidence of thrombus. Normal
compressibility and flow on color Doppler imaging.

Femoral Vein: No evidence of thrombus. Normal compressibility,
respiratory phasicity and response to augmentation.

Popliteal Vein: No evidence of thrombus. Normal compressibility,
respiratory phasicity and response to augmentation.

Calf Veins: No evidence of thrombus. Normal compressibility and flow
on color Doppler imaging.

Superficial Great Saphenous Vein: No evidence of thrombus. Normal
compressibility and flow on color Doppler imaging.

Venous Reflux:  None.

Other Findings:  None.
IMPRESSION: No evidence of deep venous thrombosis.

## 2018-10-04 DIAGNOSIS — H00021 Hordeolum internum right upper eyelid: Secondary | ICD-10-CM | POA: Diagnosis not present

## 2018-10-11 DIAGNOSIS — H00021 Hordeolum internum right upper eyelid: Secondary | ICD-10-CM | POA: Diagnosis not present

## 2018-11-17 ENCOUNTER — Encounter: Payer: Self-pay | Admitting: General Surgery

## 2019-01-10 ENCOUNTER — Other Ambulatory Visit: Payer: Self-pay

## 2019-01-10 DIAGNOSIS — R920 Mammographic microcalcification found on diagnostic imaging of breast: Secondary | ICD-10-CM

## 2019-01-14 DIAGNOSIS — Z23 Encounter for immunization: Secondary | ICD-10-CM | POA: Diagnosis not present

## 2019-01-17 DIAGNOSIS — H2513 Age-related nuclear cataract, bilateral: Secondary | ICD-10-CM | POA: Diagnosis not present

## 2019-01-24 DIAGNOSIS — M7061 Trochanteric bursitis, right hip: Secondary | ICD-10-CM | POA: Diagnosis not present

## 2019-01-24 DIAGNOSIS — M25511 Pain in right shoulder: Secondary | ICD-10-CM | POA: Diagnosis not present

## 2019-01-24 DIAGNOSIS — M7062 Trochanteric bursitis, left hip: Secondary | ICD-10-CM | POA: Diagnosis not present

## 2019-01-28 DIAGNOSIS — H2512 Age-related nuclear cataract, left eye: Secondary | ICD-10-CM | POA: Diagnosis not present

## 2019-01-28 DIAGNOSIS — I10 Essential (primary) hypertension: Secondary | ICD-10-CM | POA: Diagnosis not present

## 2019-02-02 ENCOUNTER — Encounter: Payer: Self-pay | Admitting: *Deleted

## 2019-02-02 ENCOUNTER — Other Ambulatory Visit: Payer: Self-pay

## 2019-02-04 ENCOUNTER — Other Ambulatory Visit
Admission: RE | Admit: 2019-02-04 | Discharge: 2019-02-04 | Disposition: A | Discharge status: Home / Self Care | Payer: PPO | Source: Ambulatory Visit | Attending: Ophthalmology | Admitting: Ophthalmology

## 2019-02-04 DIAGNOSIS — Z20828 Contact with and (suspected) exposure to other viral communicable diseases: Secondary | ICD-10-CM | POA: Diagnosis not present

## 2019-02-04 DIAGNOSIS — Z01812 Encounter for preprocedural laboratory examination: Secondary | ICD-10-CM | POA: Insufficient documentation

## 2019-02-04 LAB — SARS CORONAVIRUS 2 (TAT 6-24 HRS): SARS Coronavirus 2: NEGATIVE

## 2019-02-04 NOTE — Discharge Instructions (Signed)

## 2019-02-08 ENCOUNTER — Ambulatory Visit: Payer: PPO | Admitting: Anesthesiology

## 2019-02-08 ENCOUNTER — Encounter
Admission: RE | Disposition: A | Discharge status: Home / Self Care | Payer: Self-pay | Source: Home / Self Care | Attending: Ophthalmology

## 2019-02-08 ENCOUNTER — Ambulatory Visit
Admission: RE | Admit: 2019-02-08 | Discharge: 2019-02-08 | Disposition: A | Discharge status: Home / Self Care | Payer: PPO | Attending: Ophthalmology | Admitting: Ophthalmology

## 2019-02-08 ENCOUNTER — Other Ambulatory Visit: Payer: Self-pay

## 2019-02-08 DIAGNOSIS — Z888 Allergy status to other drugs, medicaments and biological substances status: Secondary | ICD-10-CM | POA: Insufficient documentation

## 2019-02-08 DIAGNOSIS — Z86711 Personal history of pulmonary embolism: Secondary | ICD-10-CM | POA: Insufficient documentation

## 2019-02-08 DIAGNOSIS — E785 Hyperlipidemia, unspecified: Secondary | ICD-10-CM | POA: Insufficient documentation

## 2019-02-08 DIAGNOSIS — I252 Old myocardial infarction: Secondary | ICD-10-CM | POA: Insufficient documentation

## 2019-02-08 DIAGNOSIS — Z7982 Long term (current) use of aspirin: Secondary | ICD-10-CM | POA: Diagnosis not present

## 2019-02-08 DIAGNOSIS — Z79899 Other long term (current) drug therapy: Secondary | ICD-10-CM | POA: Diagnosis not present

## 2019-02-08 DIAGNOSIS — H25812 Combined forms of age-related cataract, left eye: Secondary | ICD-10-CM | POA: Diagnosis not present

## 2019-02-08 DIAGNOSIS — Z955 Presence of coronary angioplasty implant and graft: Secondary | ICD-10-CM | POA: Insufficient documentation

## 2019-02-08 DIAGNOSIS — H2512 Age-related nuclear cataract, left eye: Secondary | ICD-10-CM | POA: Diagnosis not present

## 2019-02-08 HISTORY — PX: CATARACT EXTRACTION W/PHACO: SHX586

## 2019-02-08 HISTORY — DX: Acute myocardial infarction, unspecified: I21.9

## 2019-02-08 HISTORY — DX: Personal history of other infectious and parasitic diseases: Z86.19

## 2019-02-08 SURGERY — PHACOEMULSIFICATION, CATARACT, WITH IOL INSERTION
Anesthesia: Monitor Anesthesia Care | Site: Eye | Laterality: Left

## 2019-02-08 MED ORDER — ONDANSETRON HCL 4 MG/2ML IJ SOLN: 4.0000 mg | Freq: Once | INTRAMUSCULAR | Status: DC | PRN

## 2019-02-08 MED ORDER — MOXIFLOXACIN HCL 0.5 % OP SOLN
OPHTHALMIC | Status: DC | PRN
  Administered 2019-02-08: 0.2 mL via OPHTHALMIC

## 2019-02-08 MED ORDER — ARMC OPHTHALMIC DILATING DROPS
1.0000 "application " | OPHTHALMIC | Status: DC | PRN
  Administered 2019-02-08 (×3): 1 via OPHTHALMIC

## 2019-02-08 MED ORDER — TETRACAINE HCL 0.5 % OP SOLN
1.0000 [drp] | OPHTHALMIC | Status: DC | PRN
  Administered 2019-02-08 (×3): 1 [drp] via OPHTHALMIC

## 2019-02-08 MED ORDER — BRIMONIDINE TARTRATE-TIMOLOL 0.2-0.5 % OP SOLN
OPHTHALMIC | Status: DC | PRN
  Administered 2019-02-08: 1 [drp] via OPHTHALMIC

## 2019-02-08 MED ORDER — MIDAZOLAM HCL 2 MG/2ML IJ SOLN
INTRAMUSCULAR | Status: DC | PRN
  Administered 2019-02-08: 1 mg via INTRAVENOUS

## 2019-02-08 MED ORDER — LIDOCAINE HCL (PF) 2 % IJ SOLN
INTRAOCULAR | Status: DC | PRN
  Administered 2019-02-08: 1 mL

## 2019-02-08 MED ORDER — LACTATED RINGERS IV SOLN: INTRAVENOUS | Status: DC

## 2019-02-08 MED ORDER — EPINEPHRINE PF 1 MG/ML IJ SOLN
INTRAOCULAR | Status: DC | PRN
  Administered 2019-02-08: 50 mL via OPHTHALMIC

## 2019-02-08 MED ORDER — FENTANYL CITRATE (PF) 100 MCG/2ML IJ SOLN
INTRAMUSCULAR | Status: DC | PRN
  Administered 2019-02-08: 50 ug via INTRAVENOUS

## 2019-02-08 MED ORDER — NA CHONDROIT SULF-NA HYALURON 40-17 MG/ML IO SOLN
INTRAOCULAR | Status: DC | PRN
  Administered 2019-02-08: 1 mL via INTRAOCULAR

## 2019-02-08 SURGICAL SUPPLY — 20 items
CANNULA ANT/CHMB 27G (MISCELLANEOUS) ×2 IMPLANT
CANNULA ANT/CHMB 27GA (MISCELLANEOUS) ×4 IMPLANT
GLOVE SURG LX 8.0 MICRO (GLOVE) ×1
GLOVE SURG LX STRL 8.0 MICRO (GLOVE) ×1 IMPLANT
GLOVE SURG TRIUMPH 8.0 PF LTX (GLOVE) ×2 IMPLANT
GOWN STRL REUS W/ TWL LRG LVL3 (GOWN DISPOSABLE) ×2 IMPLANT
GOWN STRL REUS W/TWL LRG LVL3 (GOWN DISPOSABLE) ×2
LENS IOL TECNIS ITEC 18.5 (Intraocular Lens) ×1 IMPLANT
MARKER SKIN DUAL TIP RULER LAB (MISCELLANEOUS) ×2 IMPLANT
NDL FILTER BLUNT 18X1 1/2 (NEEDLE) ×1 IMPLANT
NDL RETROBULBAR .5 NSTRL (NEEDLE) ×2 IMPLANT
NEEDLE FILTER BLUNT 18X 1/2SAF (NEEDLE) ×1
NEEDLE FILTER BLUNT 18X1 1/2 (NEEDLE) ×1 IMPLANT
PACK EYE AFTER SURG (MISCELLANEOUS) ×2 IMPLANT
PACK OPTHALMIC (MISCELLANEOUS) ×2 IMPLANT
PACK PORFILIO (MISCELLANEOUS) ×2 IMPLANT
SYR 3ML LL SCALE MARK (SYRINGE) ×2 IMPLANT
SYR TB 1ML LUER SLIP (SYRINGE) ×2 IMPLANT
WATER STERILE IRR 250ML POUR (IV SOLUTION) ×2 IMPLANT
WIPE NON LINTING 3.25X3.25 (MISCELLANEOUS) ×2 IMPLANT

## 2019-02-08 NOTE — Op Note (Signed)
PREOPERATIVE DIAGNOSIS:  Nuclear sclerotic cataract of the left eye.   POSTOPERATIVE DIAGNOSIS:  Nuclear sclerotic cataract of the left eye.   OPERATIVE PROCEDURE:@   SURGEON:  Birder Robson, MD.   ANESTHESIA:  Anesthesiologist: Veda Canning, MD CRNA: Mayme Genta, CRNA; Cameron Ali, CRNA  1.      Managed anesthesia care. 2.     0.64ml of Shugarcaine was instilled following the paracentesis   COMPLICATIONS:  None.   TECHNIQUE:   Stop and chop   DESCRIPTION OF PROCEDURE:  The patient was examined and consented in the preoperative holding area where the aforementioned topical anesthesia was applied to the left eye and then brought back to the Operating Room where the left eye was prepped and draped in the usual sterile ophthalmic fashion and a lid speculum was placed. A paracentesis was created with the side port blade and the anterior chamber was filled with viscoelastic. A near clear corneal incision was performed with the steel keratome. A continuous curvilinear capsulorrhexis was performed with a cystotome followed by the capsulorrhexis forceps. Hydrodissection and hydrodelineation were carried out with BSS on a blunt cannula. The lens was removed in a stop and chop  technique and the remaining cortical material was removed with the irrigation-aspiration handpiece. The capsular bag was inflated with viscoelastic and the Technis [id number] lens was placed in the capsular bag without complication. The remaining viscoelastic was removed from the eye with the irrigation-aspiration handpiece. The wounds were hydrated. The anterior chamber was flushed with BSS and the eye was inflated to physiologic pressure. 0.37ml Vigamox was placed in the anterior chamber. The wounds were found to be water tight. The eye was dressed with Combigan. The patient was given protective glasses to wear throughout the day and a shield with which to sleep tonight. The patient was also given drops with which to begin a drop  regimen today and will follow-up with me in one day. Implant Name Type Inv. Item Serial No. Manufacturer Lot No. LRB No. Used Action  LENS IOL DIOP 18.5 - [id number] Intraocular Lens LENS IOL DIOP 18.5 [id number]  Left 1 Implanted    Procedure(s): CATARACT EXTRACTION PHACO AND INTRAOCULAR LENS PLACEMENT (IOC) LEFT 00:40.0  20.6%  8.26 (Left)  Electronically signed: Birder Robson 02/08/2019 11:38 AM

## 2019-02-08 NOTE — H&P (Signed)
All labs reviewed. Abnormal studies sent to patients PCP when indicated.  Previous H&P reviewed, patient examined, there are NO CHANGES.  April Wentzell Porfilio10/13/202011:12 AM

## 2019-02-08 NOTE — Anesthesia Procedure Notes (Signed)
Procedure Name: MAC Date/Time: 02/08/2019 11:19 AM Performed by: Cameron Ali, CRNA Pre-anesthesia Checklist: Patient identified, Emergency Drugs available, Suction available, Timeout performed and Patient being monitored Patient Re-evaluated:Patient Re-evaluated prior to induction Oxygen Delivery Method: Nasal cannula Placement Confirmation: positive ETCO2

## 2019-02-08 NOTE — Transfer of Care (Signed)
Immediate Anesthesia Transfer of Care Note  Patient: April [name]  Procedure(s) Performed: CATARACT EXTRACTION PHACO AND INTRAOCULAR LENS PLACEMENT (IOC) LEFT 00:40.0  20.6%  8.26 (Left Eye)  Patient Location: PACU  Anesthesia Type: MAC  Level of Consciousness: awake, alert  and patient cooperative  Airway and Oxygen Therapy: Patient Spontanous Breathing and Patient connected to supplemental oxygen  Post-op Assessment: Post-op Vital signs reviewed, Patient's Cardiovascular Status Stable, Respiratory Function Stable, Patent Airway and No signs of Nausea or vomiting  Post-op Vital Signs: Reviewed and stable  Complications: No apparent anesthesia complications

## 2019-02-08 NOTE — Anesthesia Preprocedure Evaluation (Addendum)
Anesthesia Evaluation  Patient identified by MRN, date of birth, ID band Patient awake    Reviewed: Allergy & Precautions, NPO status , Patient's Chart, lab work & pertinent test results  Airway Mallampati: II  TM Distance: >3 FB     Dental   Pulmonary neg pulmonary ROS,    breath sounds clear to auscultation       Cardiovascular + Past MI (2006) and + Cardiac Stents   Rhythm:Regular Rate:Normal  HLD   Neuro/Psych    GI/Hepatic   Endo/Other    Renal/GU      Musculoskeletal   Abdominal   Peds  Hematology Hx PE 2016   Anesthesia Other Findings   Reproductive/Obstetrics                             Anesthesia Physical Anesthesia Plan  ASA: II  Anesthesia Plan: MAC   Post-op Pain Management:    Induction:   PONV Risk Score and Plan:   Airway Management Planned: Nasal Cannula and Natural Airway  Additional Equipment:   Intra-op Plan:   Post-operative Plan:   Informed Consent: I have reviewed the patients History and Physical, chart, labs and discussed the procedure including the risks, benefits and alternatives for the proposed anesthesia with the patient or authorized representative who has indicated his/her understanding and acceptance.       Plan Discussed with: CRNA  Anesthesia Plan Comments:         Anesthesia Quick Evaluation

## 2019-02-08 NOTE — Anesthesia Postprocedure Evaluation (Signed)
Anesthesia Post Note  Patient: April [name]  Procedure(s) Performed: CATARACT EXTRACTION PHACO AND INTRAOCULAR LENS PLACEMENT (IOC) LEFT 00:40.0  20.6%  8.26 (Left Eye)  Patient location during evaluation: PACU Anesthesia Type: MAC Level of consciousness: awake and alert Pain management: pain level controlled Vital Signs Assessment: post-procedure vital signs reviewed and stable Respiratory status: spontaneous breathing, nonlabored ventilation, respiratory function stable and patient connected to nasal cannula oxygen Cardiovascular status: stable and blood pressure returned to baseline Postop Assessment: no apparent nausea or vomiting Anesthetic complications: no    Veda Canning

## 2019-02-09 ENCOUNTER — Encounter: Payer: Self-pay | Admitting: Ophthalmology

## 2019-02-22 DIAGNOSIS — Z1272 Encounter for screening for malignant neoplasm of vagina: Secondary | ICD-10-CM | POA: Diagnosis not present

## 2019-02-22 DIAGNOSIS — Z124 Encounter for screening for malignant neoplasm of cervix: Secondary | ICD-10-CM | POA: Diagnosis not present

## 2019-02-22 DIAGNOSIS — Z1211 Encounter for screening for malignant neoplasm of colon: Secondary | ICD-10-CM | POA: Diagnosis not present

## 2019-03-02 DIAGNOSIS — I2510 Atherosclerotic heart disease of native coronary artery without angina pectoris: Secondary | ICD-10-CM | POA: Diagnosis not present

## 2019-03-02 DIAGNOSIS — M129 Arthropathy, unspecified: Secondary | ICD-10-CM | POA: Diagnosis not present

## 2019-03-02 DIAGNOSIS — E7800 Pure hypercholesterolemia, unspecified: Secondary | ICD-10-CM | POA: Diagnosis not present

## 2019-03-02 DIAGNOSIS — I10 Essential (primary) hypertension: Secondary | ICD-10-CM | POA: Diagnosis not present

## 2019-03-08 DIAGNOSIS — I2510 Atherosclerotic heart disease of native coronary artery without angina pectoris: Secondary | ICD-10-CM | POA: Diagnosis not present

## 2019-03-08 DIAGNOSIS — I2699 Other pulmonary embolism without acute cor pulmonale: Secondary | ICD-10-CM | POA: Diagnosis not present

## 2019-03-08 DIAGNOSIS — E785 Hyperlipidemia, unspecified: Secondary | ICD-10-CM | POA: Diagnosis not present

## 2019-03-08 DIAGNOSIS — I10 Essential (primary) hypertension: Secondary | ICD-10-CM | POA: Diagnosis not present

## 2019-03-14 ENCOUNTER — Ambulatory Visit
Admission: RE | Admit: 2019-03-14 | Discharge: 2019-03-14 | Disposition: A | Discharge status: Home / Self Care | Payer: PPO | Source: Ambulatory Visit | Attending: Surgery | Admitting: Surgery

## 2019-03-14 DIAGNOSIS — R920 Mammographic microcalcification found on diagnostic imaging of breast: Secondary | ICD-10-CM

## 2019-03-14 DIAGNOSIS — Z1211 Encounter for screening for malignant neoplasm of colon: Secondary | ICD-10-CM | POA: Diagnosis not present

## 2019-03-14 DIAGNOSIS — R921 Mammographic calcification found on diagnostic imaging of breast: Secondary | ICD-10-CM | POA: Diagnosis not present

## 2019-03-14 DIAGNOSIS — H2511 Age-related nuclear cataract, right eye: Secondary | ICD-10-CM | POA: Diagnosis not present

## 2019-03-15 ENCOUNTER — Telehealth: Payer: Self-pay

## 2019-03-15 NOTE — Telephone Encounter (Signed)
Patient notified normal mammogram and reminded of appointment with Dr.Pabon 03/23/2019.

## 2019-03-21 ENCOUNTER — Ambulatory Visit: Payer: Medicare Other | Admitting: Surgery

## 2019-03-23 ENCOUNTER — Ambulatory Visit: Payer: Self-pay | Admitting: Surgery

## 2019-03-23 DIAGNOSIS — M19011 Primary osteoarthritis, right shoulder: Secondary | ICD-10-CM | POA: Diagnosis not present

## 2019-03-23 DIAGNOSIS — M75101 Unspecified rotator cuff tear or rupture of right shoulder, not specified as traumatic: Secondary | ICD-10-CM | POA: Diagnosis not present

## 2019-04-01 DIAGNOSIS — X32XXXA Exposure to sunlight, initial encounter: Secondary | ICD-10-CM | POA: Diagnosis not present

## 2019-04-01 DIAGNOSIS — Z08 Encounter for follow-up examination after completed treatment for malignant neoplasm: Secondary | ICD-10-CM | POA: Diagnosis not present

## 2019-04-01 DIAGNOSIS — L814 Other melanin hyperpigmentation: Secondary | ICD-10-CM | POA: Diagnosis not present

## 2019-04-01 DIAGNOSIS — L821 Other seborrheic keratosis: Secondary | ICD-10-CM | POA: Diagnosis not present

## 2019-04-01 DIAGNOSIS — Z85828 Personal history of other malignant neoplasm of skin: Secondary | ICD-10-CM | POA: Diagnosis not present

## 2019-04-05 DIAGNOSIS — H2511 Age-related nuclear cataract, right eye: Secondary | ICD-10-CM | POA: Diagnosis not present

## 2019-04-05 DIAGNOSIS — E1159 Type 2 diabetes mellitus with other circulatory complications: Secondary | ICD-10-CM | POA: Diagnosis not present

## 2019-04-06 ENCOUNTER — Ambulatory Visit: Payer: Self-pay | Admitting: Surgery

## 2019-04-13 ENCOUNTER — Ambulatory Visit (INDEPENDENT_AMBULATORY_CARE_PROVIDER_SITE_OTHER): Payer: PPO | Admitting: Surgery

## 2019-04-13 ENCOUNTER — Other Ambulatory Visit: Payer: Self-pay

## 2019-04-13 ENCOUNTER — Encounter: Payer: Self-pay | Admitting: Surgery

## 2019-04-13 VITALS — BP 159/76 | HR 73 | Temp 97.5°F | Resp 14 | Ht 67.0 in | Wt 186.0 lb

## 2019-04-13 DIAGNOSIS — R920 Mammographic microcalcification found on diagnostic imaging of breast: Secondary | ICD-10-CM

## 2019-04-13 NOTE — Patient Instructions (Addendum)
Please continue with your yearly routine mammograms. You will hear from our office in November 2021 in regards to your Mammogram and office visit for December 2021. Please call the office if you have any questions or concerns.  Breast Self-Awareness Breast self-awareness means being familiar with how your breasts look and feel. It involves checking your breasts regularly and reporting any changes to your health care provider. Practicing breast self-awareness is important. Sometimes changes may not be harmful (are benign), but sometimes a change in your breasts can be a sign of a serious medical problem. It is important to learn how to do this procedure correctly so that you can catch problems early, when treatment is more likely to be successful. All women should practice breast self-awareness, including women who have had breast implants. What you need:  A mirror.  A well-lit room. How to do a breast self-exam A breast self-exam is one way to learn what is normal for your breasts and whether your breasts are changing. To do a breast self-exam: Look for changes  1. Remove all the clothing above your waist. 2. Stand in front of a mirror in a room with good lighting. 3. Put your hands on your hips. 4. Push your hands firmly downward. 5. Compare your breasts in the mirror. Look for differences between them (asymmetry), such as: ? Differences in shape. ? Differences in size. ? Puckers, dips, and bumps in one breast and not the other. 6. Look at each breast for changes in the skin, such as: ? Redness. ? Scaly areas. 7. Look for changes in your nipples, such as: ? Discharge. ? Bleeding. ? Dimpling. ? Redness. ? A change in position. Feel for changes Carefully feel your breasts for lumps and changes. It is best to do this while lying on your back on the floor, and again while sitting or standing in the tub or shower with soapy water on your skin. Feel each breast in the following  way: 1. Place the arm on the side of the breast you are examining above your head. 2. Feel your breast with the other hand. 3. Start in the nipple area and make -inch (2 cm) overlapping circles to feel your breast. Use the pads of your three middle fingers to do this. Apply light pressure, then medium pressure, then firm pressure. The light pressure will allow you to feel the tissue closest to the skin. The medium pressure will allow you to feel the tissue that is a little deeper. The firm pressure will allow you to feel the tissue close to the ribs. 4. Continue the overlapping circles, moving downward over the breast until you feel your ribs below your breast. 5. Move one finger-width toward the center of the body. Continue to use the -inch (2 cm) overlapping circles to feel your breast as you move slowly up toward your collarbone. 6. Continue the up-and-down exam using all three pressures until you reach your armpit.  Write down what you find Writing down what you find can help you remember what to discuss with your health care provider. Write down:  What is normal for each breast.  Any changes that you find in each breast, including: ? The kind of changes you find. ? Any pain or tenderness. ? Size and location of any lumps.  Where you are in your menstrual cycle, if you are still menstruating. General tips and recommendations  Examine your breasts every month.  If you are breastfeeding, the best time to examine  your breasts is after a feeding or after using a breast pump.  If you menstruate, the best time to examine your breasts is 5-7 days after your period. Breasts are generally lumpier during menstrual periods, and it may be more difficult to notice changes.  With time and practice, you will become more familiar with the variations in your breasts and more comfortable with the exam. Contact a health care provider if you:  See a change in the shape or size of your breasts or  nipples.  See a change in the skin of your breast or nipples, such as a reddened or scaly area.  Have unusual discharge from your nipples.  Find a lump or thick area that was not there before.  Have pain in your breasts.  Have any concerns related to your breast health. Summary  Breast self-awareness includes looking for physical changes in your breasts, as well as feeling for any changes within your breasts.  Breast self-awareness should be performed in front of a mirror in a well-lit room.  You should examine your breasts every month. If you menstruate, the best time to examine your breasts is 5-7 days after your menstrual period.  Let your health care provider know of any changes you notice in your breasts, including changes in size, changes on the skin, pain or tenderness, or unusual fluid from your nipples. This information is not intended to replace advice given to you by your health care provider. Make sure you discuss any questions you have with your health care provider. Document Released: 04/14/2005 Document Revised: 12/01/2017 Document Reviewed: 12/01/2017 Elsevier Patient Education  2020 [employer].

## 2019-04-13 NOTE — Progress Notes (Signed)
Outpatient Surgical Follow Up  04/13/2019  April [name] is an 73 y.o. female.   Chief Complaint  Patient presents with  . Follow-up    bila DX mammogram 1 year    HPI: April [name] is a 73 year old female here for follow-up regarding breast calcifications on the right side.  Currently she denies any complaints regarding her breast.  No masses, no pain, no nipple discharge.  No fevers no chills.  She does perform self breast examinations.  I have personally reviewed the mammogram and discussed the findings with the patient showing calcifications on the right breast there is no change they are stable and there is no evidence of concerns.  No need for biopsy or further intervention at this time.  Past Medical History:  Diagnosis Date  . Cardiac abnormality    STENT  . Cataract   . Diverticula of colon   . Endometriosis   . History of shingles   . Hx of blood clots 2017   [facility]  . Myocardial infarction ([place])    2006  . 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 Pulmonary embolism ([place]) 2016  . Vertigo 1990   "inner ear"    Past Surgical History:  Procedure Laterality Date  . ABDOMINAL HYSTERECTOMY    . APPENDECTOMY    . CARDIAC CATHETERIZATION  2005  . CATARACT EXTRACTION W/PHACO Left 02/08/2019   Procedure: CATARACT EXTRACTION PHACO AND INTRAOCULAR LENS PLACEMENT (IOC) LEFT 00:40.0  20.6%  8.26;  Surgeon: Birder Robson, MD;  Location: [facility];  Service: Ophthalmology;  Laterality: Left;  . CHOLECYSTECTOMY    . COLONOSCOPY  2017  . FOOT SURGERY      Family History  Problem Relation Age of Onset  . Stroke Mother   . CAD Father   . Colon cancer Other        paternal great grandfather  . Breast cancer Neg Hx     Social History:  reports that she has never smoked. She has never used smokeless tobacco. She reports that she does not drink alcohol or use drugs.  Allergies:  Allergies   Allergen Reactions  . Antihistamines, Chlorpheniramine-Type Other (See Comments)    "think crazy thoughts"  . Ciprofloxacin Other (See Comments)    Patient cannot remember reaction  . Dristan Cold [Chlorphen-Pe-Acetaminophen] Other (See Comments)    "think crazy thoughts"  . Ginger Diarrhea    Medications reviewed.    ROS Full ROS performed and is otherwise negative other than what is stated in HPI   BP (!) 159/76   Pulse 73   Temp (!) 97.5 F (36.4 C) (Temporal)   Resp 14   Ht 5\' 7"  (1.702 m)   Wt 186 lb (84.4 kg)   SpO2 97%   BMI 29.13 kg/m   Physical Exam Vitals and nursing note reviewed. Exam conducted with a chaperone present.  Constitutional:      General: She is not in acute distress.    Appearance: Normal appearance. She is normal weight.  Eyes:     General: No scleral icterus.       Right eye: No discharge.        Left eye: No discharge.  Pulmonary:     Effort: Pulmonary effort is normal. No respiratory distress.     Breath sounds: Normal breath sounds. No stridor.     Comments: BREAST: There is no evidence of any breast mass,  there is no evidence of any nipple discharge.  No evidence of any skin changes or axillary lymphadenopathy Abdominal:     General: Abdomen is flat. There is no distension.     Palpations: There is no mass.     Tenderness: There is no abdominal tenderness.     Hernia: No hernia is present.  Musculoskeletal:        General: No swelling. Normal range of motion.     Cervical back: Normal range of motion. No rigidity or tenderness.  Lymphadenopathy:     Cervical: No cervical adenopathy.  Skin:    General: Skin is warm and dry.     Capillary Refill: Capillary refill takes less than 2 seconds.  Neurological:     General: No focal deficit present.     Mental Status: She is alert and oriented to person, place, and time.  Psychiatric:        Mood and Affect: Mood normal.        Behavior: Behavior normal.        Thought Content:  Thought content normal.        Judgment: Judgment normal.      Assessment/Plan: 73 year old female with benign breast calcifications on the right side no need for further intervention.  We will follow her up in 1 year with a mammogram. Greater than 50% of the 25 minutes  visit was spent in counseling/coordination of care   Caroleen Hamman, MD [place] Surgeon

## 2019-04-18 ENCOUNTER — Encounter: Payer: Self-pay | Admitting: Ophthalmology

## 2019-04-18 ENCOUNTER — Other Ambulatory Visit: Payer: Self-pay

## 2019-04-20 NOTE — Discharge Instructions (Signed)

## 2019-04-21 ENCOUNTER — Other Ambulatory Visit
Admission: RE | Admit: 2019-04-21 | Discharge: 2019-04-21 | Disposition: A | Discharge status: Home / Self Care | Payer: PPO | Source: Ambulatory Visit | Attending: Ophthalmology | Admitting: Ophthalmology

## 2019-04-21 DIAGNOSIS — Z20828 Contact with and (suspected) exposure to other viral communicable diseases: Secondary | ICD-10-CM | POA: Diagnosis not present

## 2019-04-21 DIAGNOSIS — Z01812 Encounter for preprocedural laboratory examination: Secondary | ICD-10-CM | POA: Diagnosis not present

## 2019-04-21 LAB — SARS CORONAVIRUS 2 (TAT 6-24 HRS): SARS Coronavirus 2: NEGATIVE

## 2019-04-26 ENCOUNTER — Ambulatory Visit: Payer: PPO | Admitting: Anesthesiology

## 2019-04-26 ENCOUNTER — Other Ambulatory Visit: Payer: Self-pay

## 2019-04-26 ENCOUNTER — Ambulatory Visit
Admission: RE | Admit: 2019-04-26 | Discharge: 2019-04-26 | Disposition: A | Discharge status: Home / Self Care | Payer: PPO | Attending: Ophthalmology | Admitting: Ophthalmology

## 2019-04-26 ENCOUNTER — Encounter
Admission: RE | Disposition: A | Discharge status: Home / Self Care | Payer: Self-pay | Source: Home / Self Care | Attending: Ophthalmology

## 2019-04-26 DIAGNOSIS — Z9842 Cataract extraction status, left eye: Secondary | ICD-10-CM | POA: Diagnosis not present

## 2019-04-26 DIAGNOSIS — Z955 Presence of coronary angioplasty implant and graft: Secondary | ICD-10-CM | POA: Diagnosis not present

## 2019-04-26 DIAGNOSIS — H2511 Age-related nuclear cataract, right eye: Secondary | ICD-10-CM | POA: Diagnosis not present

## 2019-04-26 DIAGNOSIS — Z86711 Personal history of pulmonary embolism: Secondary | ICD-10-CM | POA: Insufficient documentation

## 2019-04-26 DIAGNOSIS — Z882 Allergy status to sulfonamides status: Secondary | ICD-10-CM | POA: Insufficient documentation

## 2019-04-26 DIAGNOSIS — I252 Old myocardial infarction: Secondary | ICD-10-CM | POA: Diagnosis not present

## 2019-04-26 DIAGNOSIS — Z888 Allergy status to other drugs, medicaments and biological substances status: Secondary | ICD-10-CM | POA: Insufficient documentation

## 2019-04-26 DIAGNOSIS — H25811 Combined forms of age-related cataract, right eye: Secondary | ICD-10-CM | POA: Diagnosis not present

## 2019-04-26 HISTORY — PX: CATARACT EXTRACTION W/PHACO: SHX586

## 2019-04-26 SURGERY — PHACOEMULSIFICATION, CATARACT, WITH IOL INSERTION
Anesthesia: Monitor Anesthesia Care | Site: Eye | Laterality: Right

## 2019-04-26 MED ORDER — MOXIFLOXACIN HCL 0.5 % OP SOLN
OPHTHALMIC | Status: DC | PRN
  Administered 2019-04-26: 0.2 mL via OPHTHALMIC

## 2019-04-26 MED ORDER — TETRACAINE HCL 0.5 % OP SOLN
1.0000 [drp] | OPHTHALMIC | Status: DC | PRN
  Administered 2019-04-26 (×3): 1 [drp] via OPHTHALMIC

## 2019-04-26 MED ORDER — BRIMONIDINE TARTRATE-TIMOLOL 0.2-0.5 % OP SOLN
OPHTHALMIC | Status: DC | PRN
  Administered 2019-04-26: 1 [drp] via OPHTHALMIC

## 2019-04-26 MED ORDER — MIDAZOLAM HCL 2 MG/2ML IJ SOLN
INTRAMUSCULAR | Status: DC | PRN
  Administered 2019-04-26: 1 mg via INTRAVENOUS

## 2019-04-26 MED ORDER — ARMC OPHTHALMIC DILATING DROPS
1.0000 "application " | OPHTHALMIC | Status: DC | PRN
  Administered 2019-04-26 (×3): 1 via OPHTHALMIC

## 2019-04-26 MED ORDER — NA CHONDROIT SULF-NA HYALURON 40-17 MG/ML IO SOLN
INTRAOCULAR | Status: DC | PRN
  Administered 2019-04-26: 1 mL via INTRAOCULAR

## 2019-04-26 MED ORDER — EPINEPHRINE PF 1 MG/ML IJ SOLN
INTRAOCULAR | Status: DC | PRN
  Administered 2019-04-26: 08:00:00 51 mL via OPHTHALMIC

## 2019-04-26 MED ORDER — LIDOCAINE HCL (PF) 2 % IJ SOLN
INTRAOCULAR | Status: DC | PRN
  Administered 2019-04-26: 2 mL

## 2019-04-26 MED ORDER — FENTANYL CITRATE (PF) 100 MCG/2ML IJ SOLN
INTRAMUSCULAR | Status: DC | PRN
  Administered 2019-04-26: 50 ug via INTRAVENOUS

## 2019-04-26 SURGICAL SUPPLY — 20 items
CANNULA ANT/CHMB 27G (MISCELLANEOUS) ×2 IMPLANT
CANNULA ANT/CHMB 27GA (MISCELLANEOUS) ×6 IMPLANT
GLOVE SURG LX 8.0 MICRO (GLOVE) ×2
GLOVE SURG LX STRL 8.0 MICRO (GLOVE) ×1 IMPLANT
GLOVE SURG TRIUMPH 8.0 PF LTX (GLOVE) ×3 IMPLANT
GOWN STRL REUS W/ TWL LRG LVL3 (GOWN DISPOSABLE) ×2 IMPLANT
GOWN STRL REUS W/TWL LRG LVL3 (GOWN DISPOSABLE) ×4
LENS IOL TECNIS ITEC 18.5 (Intraocular Lens) ×2 IMPLANT
MARKER SKIN DUAL TIP RULER LAB (MISCELLANEOUS) ×3 IMPLANT
NDL FILTER BLUNT 18X1 1/2 (NEEDLE) ×1 IMPLANT
NDL RETROBULBAR .5 NSTRL (NEEDLE) ×3 IMPLANT
NEEDLE FILTER BLUNT 18X 1/2SAF (NEEDLE) ×2
NEEDLE FILTER BLUNT 18X1 1/2 (NEEDLE) ×1 IMPLANT
PACK EYE AFTER SURG (MISCELLANEOUS) ×3 IMPLANT
PACK OPTHALMIC (MISCELLANEOUS) ×3 IMPLANT
PACK PORFILIO (MISCELLANEOUS) ×3 IMPLANT
SYR 3ML LL SCALE MARK (SYRINGE) ×3 IMPLANT
SYR TB 1ML LUER SLIP (SYRINGE) ×3 IMPLANT
WATER STERILE IRR 250ML POUR (IV SOLUTION) ×3 IMPLANT
WIPE NON LINTING 3.25X3.25 (MISCELLANEOUS) ×3 IMPLANT

## 2019-04-26 NOTE — H&P (Signed)
All labs reviewed. Abnormal studies sent to patients PCP when indicated.  Previous H&P reviewed, patient examined, there are NO CHANGES.  April Marte Porfilio12/29/20207:25 AM

## 2019-04-26 NOTE — Transfer of Care (Signed)
Immediate Anesthesia Transfer of Care Note  Patient: April [name]  Procedure(s) Performed: CATARACT EXTRACTION PHACO AND INTRAOCULAR LENS PLACEMENT (IOC) RIGHT 6.83 00:46.0 (Right Eye)  Patient Location: PACU  Anesthesia Type: MAC  Level of Consciousness: awake, alert  and patient cooperative  Airway and Oxygen Therapy: Patient Spontanous Breathing and Patient connected to supplemental oxygen  Post-op Assessment: Post-op Vital signs reviewed, Patient's Cardiovascular Status Stable, Respiratory Function Stable, Patent Airway and No signs of Nausea or vomiting  Post-op Vital Signs: Reviewed and stable  Complications: No apparent anesthesia complications

## 2019-04-26 NOTE — Anesthesia Postprocedure Evaluation (Signed)
Anesthesia Post Note  Patient: April [name]  Procedure(s) Performed: CATARACT EXTRACTION PHACO AND INTRAOCULAR LENS PLACEMENT (IOC) RIGHT 6.83 00:46.0 (Right Eye)     Patient location during evaluation: PACU Anesthesia Type: MAC Level of consciousness: awake Pain management: pain level controlled Vital Signs Assessment: post-procedure vital signs reviewed and stable Respiratory status: respiratory function stable Cardiovascular status: stable Postop Assessment: no apparent nausea or vomiting Anesthetic complications: no    Veda Canning

## 2019-04-26 NOTE — Anesthesia Preprocedure Evaluation (Signed)
Anesthesia Evaluation  Patient identified by MRN, date of birth, ID band Patient awake    Reviewed: Allergy & Precautions, NPO status , Patient's Chart, lab work & pertinent test results  Airway Mallampati: II  TM Distance: >3 FB     Dental   Pulmonary neg pulmonary ROS,    breath sounds clear to auscultation       Cardiovascular + Past MI (2006) and + Cardiac Stents   Rhythm:Regular Rate:Normal  HLD   Neuro/Psych    GI/Hepatic   Endo/Other    Renal/GU      Musculoskeletal   Abdominal   Peds  Hematology Hx PE 2016   Anesthesia Other Findings   Reproductive/Obstetrics                             Anesthesia Physical  Anesthesia Plan  ASA: II  Anesthesia Plan: MAC   Post-op Pain Management:    Induction:   PONV Risk Score and Plan:   Airway Management Planned: Nasal Cannula and Natural Airway  Additional Equipment:   Intra-op Plan:   Post-operative Plan:   Informed Consent: I have reviewed the patients History and Physical, chart, labs and discussed the procedure including the risks, benefits and alternatives for the proposed anesthesia with the patient or authorized representative who has indicated his/her understanding and acceptance.       Plan Discussed with: CRNA  Anesthesia Plan Comments:         Anesthesia Quick Evaluation

## 2019-04-26 NOTE — Anesthesia Procedure Notes (Signed)
Procedure Name: [place] Performed by: Cameron Ali, CRNA Pre-anesthesia Checklist: Patient identified, Emergency Drugs available, Suction available, Timeout performed and Patient being monitored Patient Re-evaluated:Patient Re-evaluated prior to induction Oxygen Delivery Method: Nasal cannula Placement Confirmation: positive ETCO2

## 2019-04-26 NOTE — Op Note (Signed)
PREOPERATIVE DIAGNOSIS:  Nuclear sclerotic cataract of the right eye.   POSTOPERATIVE DIAGNOSIS:  H25.11 Cataract   OPERATIVE PROCEDURE:@   SURGEON:  Birder Robson, MD.   ANESTHESIA:  Anesthesiologist: Veda Canning, MD CRNA: Cameron Ali, CRNA  1.      Managed anesthesia care. 2.      0.19ml of Shugarcaine was instilled in the eye following the paracentesis.   COMPLICATIONS:  None.   TECHNIQUE:   Stop and chop   DESCRIPTION OF PROCEDURE:  The patient was examined and consented in the preoperative holding area where the aforementioned topical anesthesia was applied to the right eye and then brought back to the Operating Room where the right eye was prepped and draped in the usual sterile ophthalmic fashion and a lid speculum was placed. A paracentesis was created with the side port blade and the anterior chamber was filled with viscoelastic. A near clear corneal incision was performed with the steel keratome. A continuous curvilinear capsulorrhexis was performed with a cystotome followed by the capsulorrhexis forceps. Hydrodissection and hydrodelineation were carried out with BSS on a blunt cannula. The lens was removed in a stop and chop  technique and the remaining cortical material was removed with the irrigation-aspiration handpiece. The capsular bag was inflated with viscoelastic and the Technis [id number]  lens was placed in the capsular bag without complication. The remaining viscoelastic was removed from the eye with the irrigation-aspiration handpiece. The wounds were hydrated. The anterior chamber was flushed with BSS and the eye was inflated to physiologic pressure. 0.78ml of Vigamox was placed in the anterior chamber. The wounds were found to be water tight. The eye was dressed with Combigan. The patient was given protective glasses to wear throughout the day and a shield with which to sleep tonight. The patient was also given drops with which to begin a drop regimen today and will  follow-up with me in one day. Implant Name Type Inv. Item Serial No. Manufacturer Lot No. LRB No. Used Action  LENS IOL DIOP 18.5 - [id number] Intraocular Lens LENS IOL DIOP 18.5 [id number]  Right 1 Implanted   Procedure(s): CATARACT EXTRACTION PHACO AND INTRAOCULAR LENS PLACEMENT (IOC) RIGHT 6.83 00:46.0 (Right)  Electronically signed: Birder Robson 04/26/2019 7:52 AM

## 2019-04-27 ENCOUNTER — Encounter: Payer: Self-pay | Admitting: *Deleted

## 2019-07-06 DIAGNOSIS — M7061 Trochanteric bursitis, right hip: Secondary | ICD-10-CM | POA: Diagnosis not present

## 2019-09-05 DIAGNOSIS — M129 Arthropathy, unspecified: Secondary | ICD-10-CM | POA: Diagnosis not present

## 2019-09-05 DIAGNOSIS — E7800 Pure hypercholesterolemia, unspecified: Secondary | ICD-10-CM | POA: Diagnosis not present

## 2019-09-05 DIAGNOSIS — Z0000 Encounter for general adult medical examination without abnormal findings: Secondary | ICD-10-CM | POA: Diagnosis not present

## 2019-09-05 DIAGNOSIS — D6852 Prothrombin gene mutation: Secondary | ICD-10-CM | POA: Diagnosis not present

## 2019-09-05 DIAGNOSIS — I10 Essential (primary) hypertension: Secondary | ICD-10-CM | POA: Diagnosis not present

## 2019-09-05 DIAGNOSIS — M722 Plantar fascial fibromatosis: Secondary | ICD-10-CM | POA: Diagnosis not present

## 2019-09-05 DIAGNOSIS — I2510 Atherosclerotic heart disease of native coronary artery without angina pectoris: Secondary | ICD-10-CM | POA: Diagnosis not present

## 2019-09-14 DIAGNOSIS — M79671 Pain in right foot: Secondary | ICD-10-CM | POA: Diagnosis not present

## 2019-09-14 DIAGNOSIS — M722 Plantar fascial fibromatosis: Secondary | ICD-10-CM | POA: Diagnosis not present

## 2019-10-03 DIAGNOSIS — M722 Plantar fascial fibromatosis: Secondary | ICD-10-CM | POA: Diagnosis not present

## 2019-10-03 DIAGNOSIS — M79671 Pain in right foot: Secondary | ICD-10-CM | POA: Diagnosis not present

## 2019-10-11 DIAGNOSIS — M1711 Unilateral primary osteoarthritis, right knee: Secondary | ICD-10-CM | POA: Diagnosis not present

## 2019-10-11 DIAGNOSIS — M25561 Pain in right knee: Secondary | ICD-10-CM | POA: Diagnosis not present

## 2019-10-11 DIAGNOSIS — M25461 Effusion, right knee: Secondary | ICD-10-CM | POA: Diagnosis not present

## 2019-10-31 ENCOUNTER — Emergency Department
Admission: EM | Admit: 2019-10-31 | Discharge: 2019-10-31 | Disposition: A | Discharge status: Home / Self Care | Payer: PPO | Attending: Emergency Medicine | Admitting: Emergency Medicine

## 2019-10-31 ENCOUNTER — Encounter: Payer: Self-pay | Admitting: Emergency Medicine

## 2019-10-31 ENCOUNTER — Other Ambulatory Visit: Payer: Self-pay

## 2019-10-31 DIAGNOSIS — Z7982 Long term (current) use of aspirin: Secondary | ICD-10-CM | POA: Insufficient documentation

## 2019-10-31 DIAGNOSIS — R42 Dizziness and giddiness: Secondary | ICD-10-CM | POA: Diagnosis not present

## 2019-10-31 DIAGNOSIS — I10 Essential (primary) hypertension: Secondary | ICD-10-CM | POA: Diagnosis not present

## 2019-10-31 DIAGNOSIS — T782XXA Anaphylactic shock, unspecified, initial encounter: Secondary | ICD-10-CM

## 2019-10-31 DIAGNOSIS — Z79899 Other long term (current) drug therapy: Secondary | ICD-10-CM | POA: Diagnosis not present

## 2019-10-31 DIAGNOSIS — R9431 Abnormal electrocardiogram [ECG] [EKG]: Secondary | ICD-10-CM | POA: Diagnosis not present

## 2019-10-31 DIAGNOSIS — L500 Allergic urticaria: Secondary | ICD-10-CM | POA: Insufficient documentation

## 2019-10-31 DIAGNOSIS — R112 Nausea with vomiting, unspecified: Secondary | ICD-10-CM | POA: Diagnosis present

## 2019-10-31 DIAGNOSIS — R0902 Hypoxemia: Secondary | ICD-10-CM | POA: Diagnosis not present

## 2019-10-31 DIAGNOSIS — R52 Pain, unspecified: Secondary | ICD-10-CM | POA: Diagnosis not present

## 2019-10-31 DIAGNOSIS — T7840XA Allergy, unspecified, initial encounter: Secondary | ICD-10-CM | POA: Diagnosis not present

## 2019-10-31 DIAGNOSIS — R0689 Other abnormalities of breathing: Secondary | ICD-10-CM | POA: Diagnosis not present

## 2019-10-31 DIAGNOSIS — R197 Diarrhea, unspecified: Secondary | ICD-10-CM | POA: Insufficient documentation

## 2019-10-31 MED ORDER — PREDNISONE 20 MG PO TABS: 40.0000 mg | ORAL_TABLET | Freq: Every day | ORAL | 0 refills | Status: DC

## 2019-10-31 MED ORDER — EPINEPHRINE 0.3 MG/0.3ML IJ SOAJ: 0.3000 mg | INTRAMUSCULAR | 1 refills | Status: DC | PRN

## 2019-10-31 MED ORDER — METHYLPREDNISOLONE SODIUM SUCC 125 MG IJ SOLR
125.0000 mg | Freq: Once | INTRAMUSCULAR | Status: AC
  Administered 2019-10-31: 125 mg via INTRAVENOUS
  Filled 2019-10-31: qty 2

## 2019-10-31 NOTE — ED Notes (Signed)
Pt was bit by bug while cutting grass. Pt reported initial itching that got worse. Pt went into house and had episode of emesis and diarrhea. Pt stated HR was racing and called EMS. Pt had SOB during trx w/ EMS and was given epi and placed on O2 [place].

## 2019-10-31 NOTE — ED Provider Notes (Signed)
[facility] Emergency Department Provider Note  Time seen: 5:34 PM  I have reviewed the triage vital signs and the nursing notes.   HISTORY  Chief Complaint Allergic Reaction   HPI April [name] is a 74 y.o. female with a past medical history of MI, endometriosis, PE, presents to the emergency department for possible allergic reaction.  According to the patient she was mowing the lawn and she felt something sting her behind her right arm.  Patient states she immediately got chills which continued to worsen over the next 5 or 10 minutes.  Patient states she went inside became very nauseous took a Benadryl but shortly afterwards vomited and then had diarrhea.  Called EMS.  EMS states hives and itching upon arrival patient was given 50 mg of IV Benadryl.  Patient had a sensation of her throat closing was also given IV Pepcid and IM epinephrine.   Past Medical History:  Diagnosis Date  . Cardiac abnormality    STENT  . Cataract   . Diverticula of colon   . Endometriosis   . History of shingles   . Hx of blood clots 2017   [facility]  . Myocardial infarction ([place])    2006  . 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 Pulmonary embolism ([place]) 2016  . Vertigo 1990   "inner ear"    Patient Active Problem List   Diagnosis Date Noted  . HLD (hyperlipidemia) 01/11/2018  . Microcalcification of right breast on mammogram 05/27/2017  . Prothrombin gene mutation ([place]) 11/25/2015  . Pulmonary embolism ([place]) 04/14/2015  . Arthritis involving multiple sites 07/03/2014  . Coronary artery disease 12/22/2013  . Complete rupture of rotator cuff 07/21/2013  . Disorder of bursae and tendons in shoulder region 07/21/2013  . Enthesopathy of hip region 07/21/2013  . Essential hypertension 07/21/2013  . 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 Pure hypercholesterolemia 07/21/2013    Past Surgical History:   Procedure Laterality Date  . ABDOMINAL HYSTERECTOMY    . APPENDECTOMY    . CARDIAC CATHETERIZATION  2005  . CATARACT EXTRACTION W/PHACO Left 02/08/2019   Procedure: CATARACT EXTRACTION PHACO AND INTRAOCULAR LENS PLACEMENT (IOC) LEFT 00:40.0  20.6%  8.26;  Surgeon: Birder Robson, MD;  Location: [place];  Service: Ophthalmology;  Laterality: Left;  . CATARACT EXTRACTION W/PHACO Right 04/26/2019   Procedure: CATARACT EXTRACTION PHACO AND INTRAOCULAR LENS PLACEMENT (IOC) RIGHT 6.83 00:46.0;  Surgeon: Birder Robson, MD;  Location: [place];  Service: Ophthalmology;  Laterality: Right;  . CHOLECYSTECTOMY    . COLONOSCOPY  2017  . FOOT SURGERY      Prior to Admission medications   Medication Sig Start Date End Date Taking? Authorizing Provider  acetaminophen (TYLENOL) 325 MG tablet Take 500 mg by mouth every 6 (six) hours as needed.     [provider]  aspirin EC 81 MG tablet Take 81 mg by mouth. 09/24/05   [provider]  atenolol (TENORMIN) 25 MG tablet Take 1 tablet by mouth daily. 09/22/14   [provider]  atorvastatin (LIPITOR) 40 MG tablet Take 40 mg by mouth daily.    [provider]  calcium citrate-vitamin D (CITRACAL+D) 315-200 MG-UNIT tablet Take 1 tablet by mouth 2 (two) times daily.    [provider]  Cholecalciferol (VITAMIN D3) 2000 UNITS capsule Take 1 capsule by mouth daily.    [provider]  Allergies  Allergen Reactions  . Antihistamines, Chlorpheniramine-Type Other (See Comments)    "think crazy thoughts"  . Ciprofloxacin Other (See Comments)    Patient cannot remember reaction  . Dristan Cold [Chlorphen-Pe-Acetaminophen] Other (See Comments)    "think crazy thoughts"  . Ginger Diarrhea    Family History  Problem Relation Age of Onset  . Stroke Mother   . CAD Father   . Colon cancer Other        paternal great grandfather  . Breast cancer Neg Hx     Social  History Social History   Tobacco Use  . Smoking status: Never Smoker  . Smokeless tobacco: Never Used  Vaping Use  . Vaping Use: Never used  Substance Use Topics  . Alcohol use: No  . Drug use: No    Review of Systems Constitutional: Negative for fever. ENT: Sensation of throat swelling, now resolved Cardiovascular: Negative for chest pain. Respiratory: Negative for shortness of breath. Gastrointestinal: Negative for abdominal pain.  Positive for nausea vomiting and diarrhea.  Now resolved Genitourinary: Negative for urinary compaints Musculoskeletal: Negative for musculoskeletal complaints Skin: Hives, largely resolved besides proximal right arm Neurological: Negative for headache All other ROS negative  ____________________________________________   PHYSICAL EXAM:  VITAL SIGNS: ED Triage Vitals  Enc Vitals Group     BP 10/31/19 1659 (!) 186/68     Pulse Rate 10/31/19 1659 77     Resp 10/31/19 1659 18     Temp 10/31/19 1659 97.9 F (36.6 C)     Temp Source 10/31/19 1659 Oral     SpO2 10/31/19 1659 100 %     Weight 10/31/19 1700 188 lb (85.3 kg)     Height 10/31/19 1700 5\' 7"  (1.702 m)     Head Circumference --      Peak Flow --      Pain Score 10/31/19 1700 0     Pain Loc --      Pain Edu? --      Excl. in [place]? --    Constitutional: Alert and oriented. Well appearing and in no distress. Eyes: Normal exam ENT      Head: Normocephalic and atraumatic.      Mouth/Throat: Mucous membranes are moist. Cardiovascular: Normal rate, regular rhythm. Respiratory: Normal respiratory effort without tachypnea nor retractions. Breath sounds are clear without wheeze. Gastrointestinal: Soft and nontender. No distention.  Musculoskeletal: Nontender with normal range of motion in all extremities. Neurologic:  Normal speech and language. No gross focal neurologic deficits Skin:  Skin is warm, dry and intact.  Psychiatric: Mood and affect are  normal.  ____________________________________________    EKG  EKG viewed and interpreted by myself shows a normal sinus rhythm at 73 bpm with a narrow QRS, normal axis, normal intervals, nonspecific ST changes.   INITIAL IMPRESSION / ASSESSMENT AND PLAN / ED COURSE  Pertinent labs & imaging results that were available during my care of the patient were reviewed by me and considered in my medical decision making (see chart for details).   Patient presents to the emergency department for likely allergic reaction.  Patient was stung by something behind her proximal right arm still has mild hives to this area.  Patient received 50 mg of IV Benadryl 20 mg of IV Pepcid and 0.5 mg of IM epinephrine by EMS.  Upon arrival patient states she is feeling much better and feels jittery and anxious, but denies any throat swelling sensation denies any shortness of breath.  Has  clear lung sounds, no signs of edema.  We will dose 125 mg of IV Solu-Medrol and continue to closely monitor in the emergency department.  Vitals are reassuring including a hypertensive blood pressure.  We will watch for several hours in the emergency department prior to discharging home.  Patient will require steroids and EpiPen upon discharge.  Patient continues to appear well over 3 hours after arrival to the emergency department.  We will discharge with PCP follow-up discussed return precautions we will discharge with steroids and EpiPen.  Discussed Benadryl usage as well.  Yakelin Grenier was evaluated in Emergency Department on 10/31/2019 for the symptoms described in the history of present illness. She was evaluated in the context of the global COVID-19 pandemic, which necessitated consideration that the patient might be at risk for infection with the SARS-CoV-2 virus that causes COVID-19. Institutional protocols and algorithms that pertain to the evaluation of patients at risk for COVID-19 are in a state of rapid change based on  information released by regulatory bodies including the CDC and federal and state organizations. These policies and algorithms were followed during the patient's care in the ED.  ____________________________________________   FINAL CLINICAL IMPRESSION(S) / ED DIAGNOSES  Allergic reaction Anaphylaxis   Harvest Dark, MD 10/31/19 2003

## 2019-10-31 NOTE — ED Triage Notes (Signed)
Pt arrival via [facility] from home due to allergic reaction. EMS states that the patient was mowing at home when she ran over something and then shortly after was stung/bitten on her right arm by something. Patient shortly after went home and felt faint, had pain and felt burning/itching in her arm, and had an episode of vomiting. When the medic truck got there, they gave her 25 mg of benadryl but patient threw it up shortly after.   With EMS, patient received 50 mg of bendrayl but then started to feel shob and like her throat was swelling. Patient was then given 0.5 mg of epi IM, 20 famatodine, and 400 mL's of fluids.   EMS vs:HR 76, 98% on 1 L

## 2019-11-03 DIAGNOSIS — N390 Urinary tract infection, site not specified: Secondary | ICD-10-CM | POA: Diagnosis not present

## 2019-11-03 DIAGNOSIS — R300 Dysuria: Secondary | ICD-10-CM | POA: Diagnosis not present

## 2019-11-03 DIAGNOSIS — T63441A Toxic effect of venom of bees, accidental (unintentional), initial encounter: Secondary | ICD-10-CM | POA: Diagnosis not present

## 2019-11-23 DIAGNOSIS — H0012 Chalazion right lower eyelid: Secondary | ICD-10-CM | POA: Diagnosis not present

## 2020-02-07 DIAGNOSIS — Z23 Encounter for immunization: Secondary | ICD-10-CM | POA: Diagnosis not present

## 2020-02-23 ENCOUNTER — Other Ambulatory Visit: Payer: Self-pay | Admitting: Obstetrics and Gynecology

## 2020-02-23 DIAGNOSIS — Z1231 Encounter for screening mammogram for malignant neoplasm of breast: Secondary | ICD-10-CM

## 2020-02-23 DIAGNOSIS — Z124 Encounter for screening for malignant neoplasm of cervix: Secondary | ICD-10-CM | POA: Diagnosis not present

## 2020-02-23 DIAGNOSIS — Z01419 Encounter for gynecological examination (general) (routine) without abnormal findings: Secondary | ICD-10-CM | POA: Diagnosis not present

## 2020-02-23 DIAGNOSIS — Z1211 Encounter for screening for malignant neoplasm of colon: Secondary | ICD-10-CM | POA: Diagnosis not present

## 2020-03-07 DIAGNOSIS — Z1211 Encounter for screening for malignant neoplasm of colon: Secondary | ICD-10-CM | POA: Diagnosis not present

## 2020-03-21 DIAGNOSIS — E7800 Pure hypercholesterolemia, unspecified: Secondary | ICD-10-CM | POA: Diagnosis not present

## 2020-03-21 DIAGNOSIS — M129 Arthropathy, unspecified: Secondary | ICD-10-CM | POA: Diagnosis not present

## 2020-03-21 DIAGNOSIS — I10 Essential (primary) hypertension: Secondary | ICD-10-CM | POA: Diagnosis not present

## 2020-03-21 DIAGNOSIS — D6852 Prothrombin gene mutation: Secondary | ICD-10-CM | POA: Diagnosis not present

## 2020-03-21 DIAGNOSIS — I2510 Atherosclerotic heart disease of native coronary artery without angina pectoris: Secondary | ICD-10-CM | POA: Diagnosis not present

## 2020-04-03 ENCOUNTER — Ambulatory Visit
Admission: RE | Admit: 2020-04-03 | Discharge: 2020-04-03 | Disposition: A | Discharge status: Home / Self Care | Payer: PPO | Source: Ambulatory Visit | Attending: Obstetrics and Gynecology | Admitting: Obstetrics and Gynecology

## 2020-04-03 ENCOUNTER — Other Ambulatory Visit: Payer: Self-pay

## 2020-04-03 DIAGNOSIS — Z1231 Encounter for screening mammogram for malignant neoplasm of breast: Secondary | ICD-10-CM | POA: Insufficient documentation

## 2020-04-04 DIAGNOSIS — Z08 Encounter for follow-up examination after completed treatment for malignant neoplasm: Secondary | ICD-10-CM | POA: Diagnosis not present

## 2020-04-04 DIAGNOSIS — L111 Transient acantholytic dermatosis [Grover]: Secondary | ICD-10-CM | POA: Diagnosis not present

## 2020-04-04 DIAGNOSIS — D225 Melanocytic nevi of trunk: Secondary | ICD-10-CM | POA: Diagnosis not present

## 2020-04-04 DIAGNOSIS — D2262 Melanocytic nevi of left upper limb, including shoulder: Secondary | ICD-10-CM | POA: Diagnosis not present

## 2020-04-04 DIAGNOSIS — D2261 Melanocytic nevi of right upper limb, including shoulder: Secondary | ICD-10-CM | POA: Diagnosis not present

## 2020-04-04 DIAGNOSIS — Z85828 Personal history of other malignant neoplasm of skin: Secondary | ICD-10-CM | POA: Diagnosis not present

## 2020-04-04 DIAGNOSIS — L821 Other seborrheic keratosis: Secondary | ICD-10-CM | POA: Diagnosis not present

## 2020-04-09 DIAGNOSIS — E7800 Pure hypercholesterolemia, unspecified: Secondary | ICD-10-CM | POA: Diagnosis not present

## 2020-04-12 ENCOUNTER — Other Ambulatory Visit: Payer: Self-pay | Admitting: Obstetrics and Gynecology

## 2020-04-12 DIAGNOSIS — R928 Other abnormal and inconclusive findings on diagnostic imaging of breast: Secondary | ICD-10-CM

## 2020-04-12 DIAGNOSIS — N6310 Unspecified lump in the right breast, unspecified quadrant: Secondary | ICD-10-CM

## 2020-04-19 ENCOUNTER — Ambulatory Visit
Admission: RE | Admit: 2020-04-19 | Discharge: 2020-04-19 | Disposition: A | Discharge status: Home / Self Care | Payer: PPO | Source: Ambulatory Visit | Attending: Obstetrics and Gynecology | Admitting: Obstetrics and Gynecology

## 2020-04-19 ENCOUNTER — Other Ambulatory Visit: Payer: Self-pay

## 2020-04-19 DIAGNOSIS — N6310 Unspecified lump in the right breast, unspecified quadrant: Secondary | ICD-10-CM | POA: Diagnosis not present

## 2020-04-19 DIAGNOSIS — R928 Other abnormal and inconclusive findings on diagnostic imaging of breast: Secondary | ICD-10-CM | POA: Insufficient documentation

## 2020-04-19 DIAGNOSIS — N6311 Unspecified lump in the right breast, upper outer quadrant: Secondary | ICD-10-CM | POA: Diagnosis not present

## 2020-04-25 ENCOUNTER — Other Ambulatory Visit: Payer: Self-pay | Admitting: Obstetrics and Gynecology

## 2020-04-25 DIAGNOSIS — N6310 Unspecified lump in the right breast, unspecified quadrant: Secondary | ICD-10-CM

## 2020-04-25 DIAGNOSIS — R928 Other abnormal and inconclusive findings on diagnostic imaging of breast: Secondary | ICD-10-CM

## 2020-04-30 ENCOUNTER — Ambulatory Visit
Admission: RE | Admit: 2020-04-30 | Discharge: 2020-04-30 | Disposition: A | Discharge status: Home / Self Care | Payer: PPO | Source: Ambulatory Visit | Attending: Obstetrics and Gynecology | Admitting: Obstetrics and Gynecology

## 2020-04-30 ENCOUNTER — Other Ambulatory Visit: Payer: Self-pay

## 2020-04-30 DIAGNOSIS — R928 Other abnormal and inconclusive findings on diagnostic imaging of breast: Secondary | ICD-10-CM

## 2020-04-30 DIAGNOSIS — N6310 Unspecified lump in the right breast, unspecified quadrant: Secondary | ICD-10-CM

## 2020-04-30 DIAGNOSIS — N641 Fat necrosis of breast: Secondary | ICD-10-CM | POA: Diagnosis not present

## 2020-04-30 DIAGNOSIS — N6011 Diffuse cystic mastopathy of right breast: Secondary | ICD-10-CM | POA: Diagnosis not present

## 2020-04-30 DIAGNOSIS — N6311 Unspecified lump in the right breast, upper outer quadrant: Secondary | ICD-10-CM | POA: Diagnosis not present

## 2020-04-30 HISTORY — PX: BREAST BIOPSY: SHX20

## 2020-05-01 LAB — SURGICAL PATHOLOGY

## 2020-05-12 ENCOUNTER — Other Ambulatory Visit: Payer: Self-pay

## 2020-05-12 ENCOUNTER — Other Ambulatory Visit: Payer: PPO

## 2020-05-12 DIAGNOSIS — Z20822 Contact with and (suspected) exposure to covid-19: Secondary | ICD-10-CM

## 2020-05-15 DIAGNOSIS — I10 Essential (primary) hypertension: Secondary | ICD-10-CM | POA: Diagnosis not present

## 2020-05-15 DIAGNOSIS — I2699 Other pulmonary embolism without acute cor pulmonale: Secondary | ICD-10-CM | POA: Diagnosis not present

## 2020-05-15 DIAGNOSIS — E785 Hyperlipidemia, unspecified: Secondary | ICD-10-CM | POA: Diagnosis not present

## 2020-05-15 DIAGNOSIS — I2510 Atherosclerotic heart disease of native coronary artery without angina pectoris: Secondary | ICD-10-CM | POA: Diagnosis not present

## 2020-05-15 LAB — NOVEL CORONAVIRUS, NAA: SARS-CoV-2, NAA: NOT DETECTED

## 2020-05-22 DIAGNOSIS — Z955 Presence of coronary angioplasty implant and graft: Secondary | ICD-10-CM | POA: Diagnosis not present

## 2020-05-22 DIAGNOSIS — R195 Other fecal abnormalities: Secondary | ICD-10-CM | POA: Diagnosis not present

## 2020-05-22 DIAGNOSIS — Z86711 Personal history of pulmonary embolism: Secondary | ICD-10-CM | POA: Diagnosis not present

## 2020-05-22 DIAGNOSIS — K5730 Diverticulosis of large intestine without perforation or abscess without bleeding: Secondary | ICD-10-CM | POA: Diagnosis not present

## 2020-06-04 IMAGING — MG DIGITAL DIAGNOSTIC BILAT W/ TOMO W/ CAD
6 of 10 series · 6 of 26 positions shown · non-contrast
Comparison: Previous exam(s).

CLINICAL DATA: Short-term follow-up for right breast
calcifications, initially assessed in February 2017.

EXAM:
DIGITAL DIAGNOSTIC BILATERAL MAMMOGRAM WITH CAD AND TOMO

[R ML]
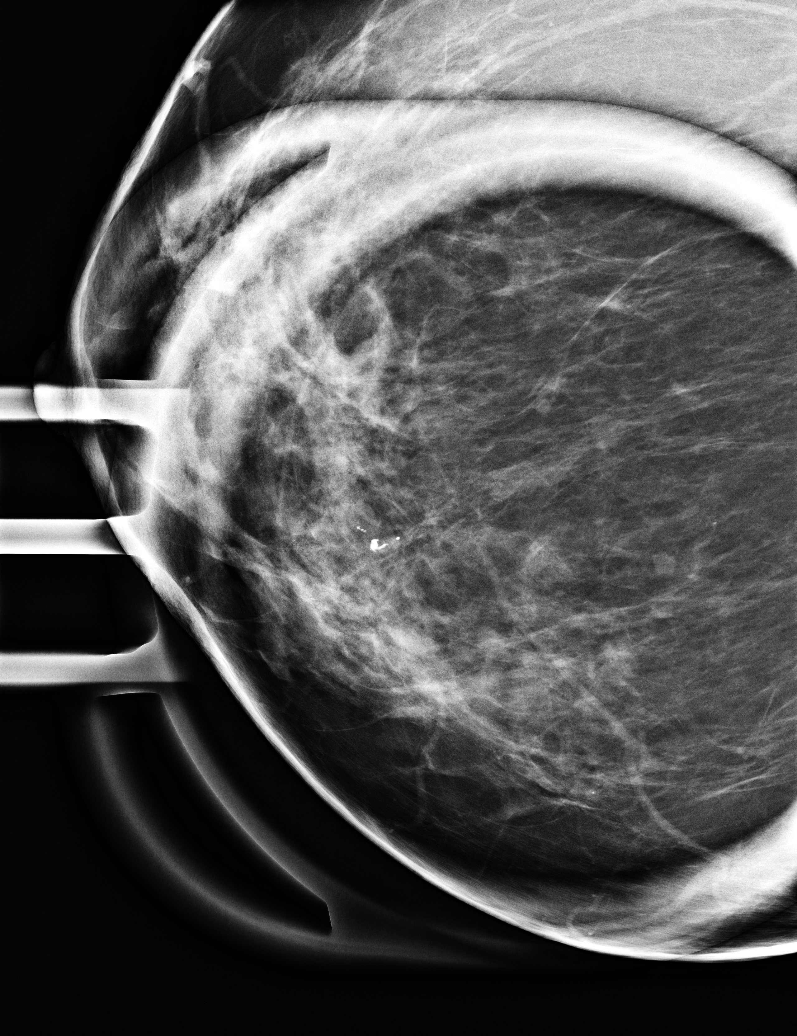

[R CC]
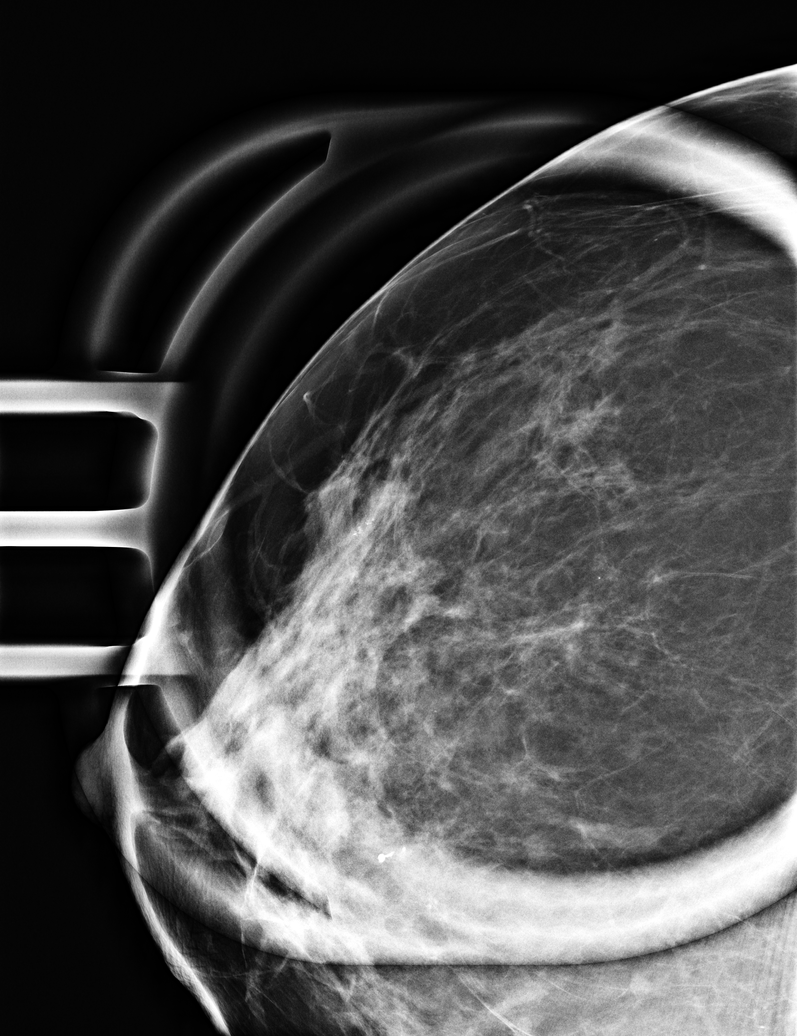

[R MLO synth-2D]
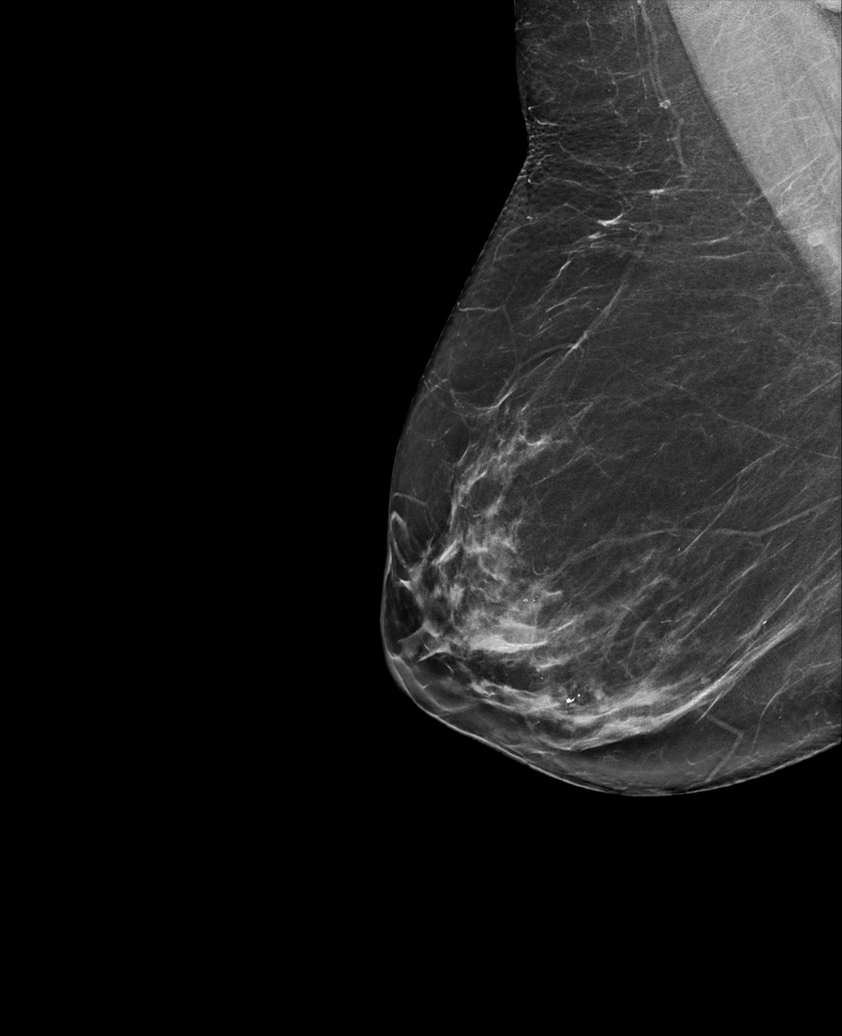

[L CC synth-2D]
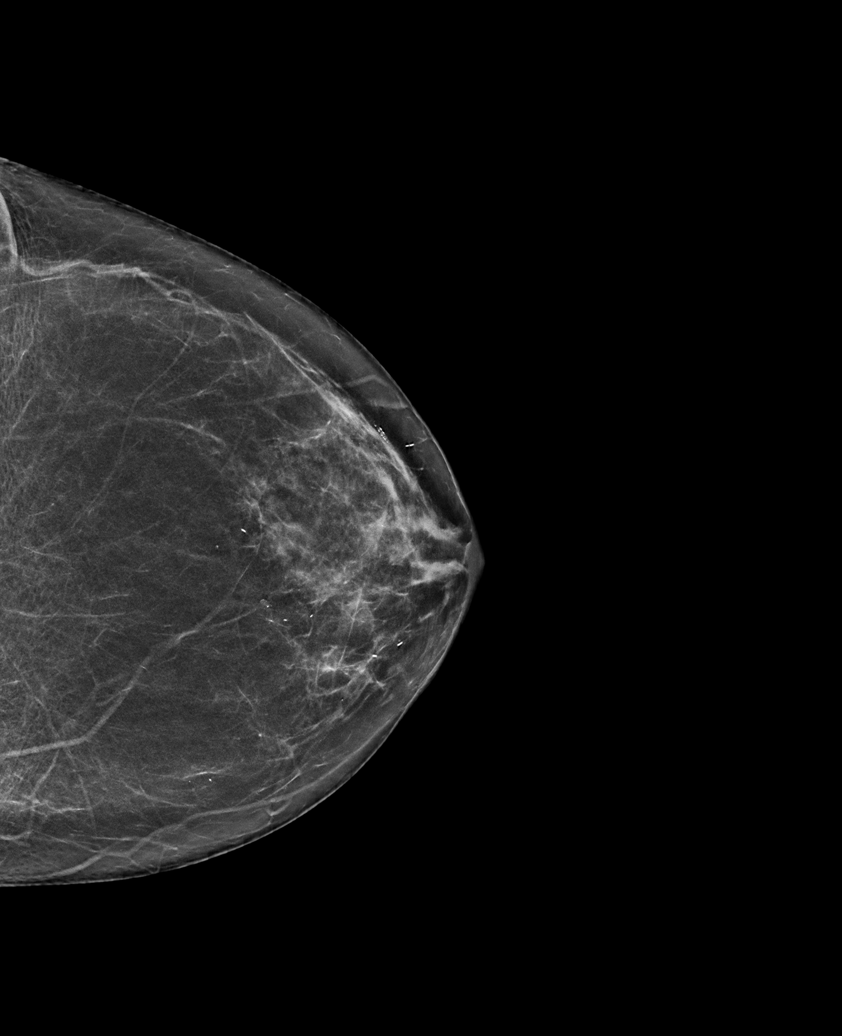

[R CC synth-2D]
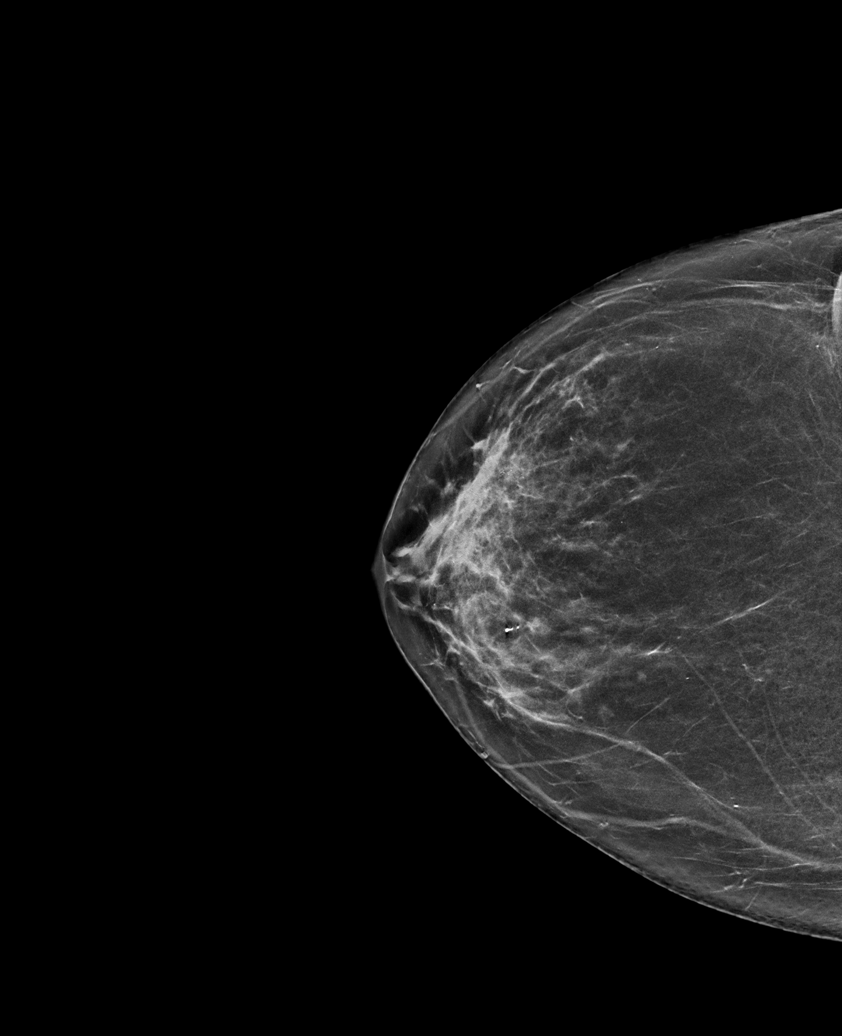

[L MLO synth-2D]
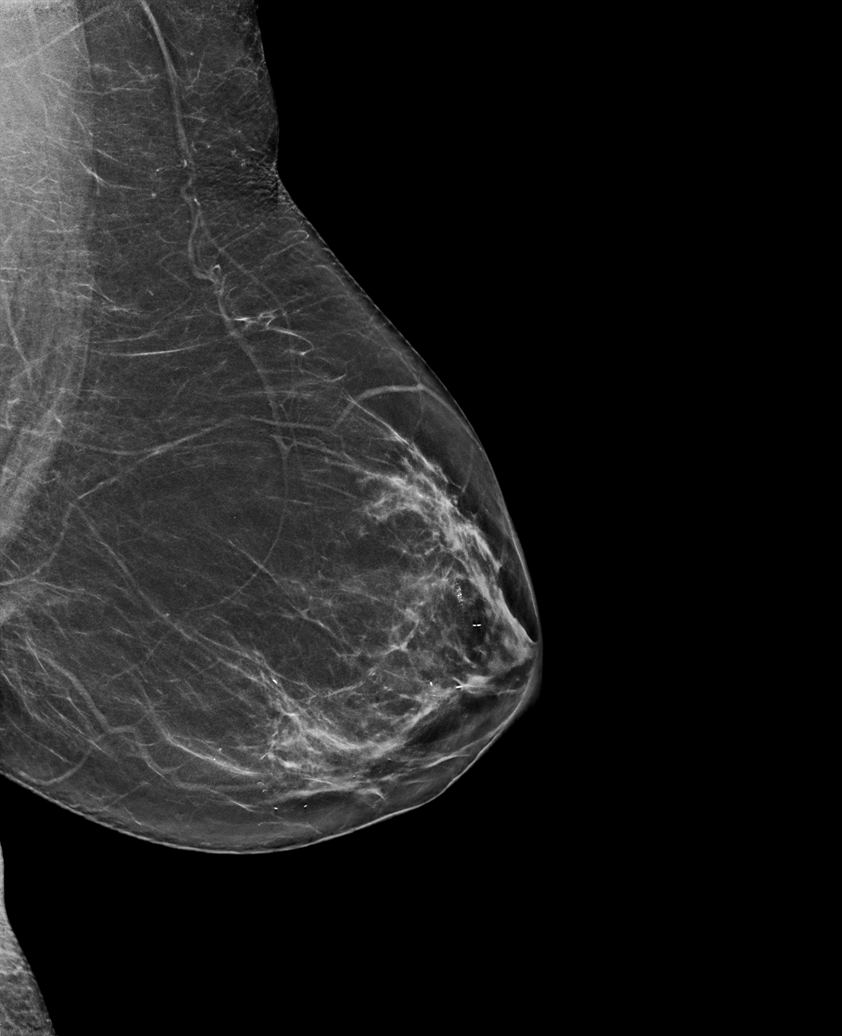

[6 of 26 positions shown; findings below may reference images not displayed]

ACR Breast Density Category b: There are scattered areas of
fibroglandular density.
FINDINGS: The small group of calcifications in the right breast are stable.

There are no masses, areas of architectural distortion or new or
suspicious calcifications.

Mammographic images were processed with CAD.
IMPRESSION: 1. No evidence of breast malignancy.
2. Benign right breast calcifications.

RECOMMENDATION:
Screening mammogram in one year.(Code:[id number])

I have discussed the findings and recommendations with the patient.
If applicable, a reminder letter will be sent to the patient
regarding the next appointment.

BI-RADS CATEGORY  2: Benign.

## 2020-06-11 DIAGNOSIS — Z01818 Encounter for other preprocedural examination: Secondary | ICD-10-CM | POA: Diagnosis not present

## 2020-06-14 DIAGNOSIS — R195 Other fecal abnormalities: Secondary | ICD-10-CM | POA: Diagnosis not present

## 2020-06-14 DIAGNOSIS — K5730 Diverticulosis of large intestine without perforation or abscess without bleeding: Secondary | ICD-10-CM | POA: Diagnosis not present

## 2020-06-14 DIAGNOSIS — K640 First degree hemorrhoids: Secondary | ICD-10-CM | POA: Diagnosis not present

## 2020-09-19 DIAGNOSIS — I2510 Atherosclerotic heart disease of native coronary artery without angina pectoris: Secondary | ICD-10-CM | POA: Diagnosis not present

## 2020-09-19 DIAGNOSIS — Z0000 Encounter for general adult medical examination without abnormal findings: Secondary | ICD-10-CM | POA: Diagnosis not present

## 2020-09-19 DIAGNOSIS — D6852 Prothrombin gene mutation: Secondary | ICD-10-CM | POA: Diagnosis not present

## 2020-09-19 DIAGNOSIS — I10 Essential (primary) hypertension: Secondary | ICD-10-CM | POA: Diagnosis not present

## 2020-09-19 DIAGNOSIS — E7800 Pure hypercholesterolemia, unspecified: Secondary | ICD-10-CM | POA: Diagnosis not present

## 2020-09-19 DIAGNOSIS — M129 Arthropathy, unspecified: Secondary | ICD-10-CM | POA: Diagnosis not present

## 2020-11-22 DIAGNOSIS — M3501 Sicca syndrome with keratoconjunctivitis: Secondary | ICD-10-CM | POA: Diagnosis not present

## 2020-12-08 ENCOUNTER — Other Ambulatory Visit: Payer: Self-pay

## 2020-12-08 ENCOUNTER — Emergency Department: Payer: PPO

## 2020-12-08 ENCOUNTER — Emergency Department
Admission: EM | Admit: 2020-12-08 | Discharge: 2020-12-08 | Disposition: A | Discharge status: Home / Self Care | Payer: PPO | Attending: Emergency Medicine | Admitting: Emergency Medicine

## 2020-12-08 DIAGNOSIS — K7689 Other specified diseases of liver: Secondary | ICD-10-CM | POA: Diagnosis not present

## 2020-12-08 DIAGNOSIS — Z79899 Other long term (current) drug therapy: Secondary | ICD-10-CM | POA: Diagnosis not present

## 2020-12-08 DIAGNOSIS — I10 Essential (primary) hypertension: Secondary | ICD-10-CM | POA: Insufficient documentation

## 2020-12-08 DIAGNOSIS — K5730 Diverticulosis of large intestine without perforation or abscess without bleeding: Secondary | ICD-10-CM | POA: Diagnosis not present

## 2020-12-08 DIAGNOSIS — Z7982 Long term (current) use of aspirin: Secondary | ICD-10-CM | POA: Insufficient documentation

## 2020-12-08 DIAGNOSIS — N200 Calculus of kidney: Secondary | ICD-10-CM | POA: Diagnosis not present

## 2020-12-08 DIAGNOSIS — R109 Unspecified abdominal pain: Secondary | ICD-10-CM | POA: Diagnosis present

## 2020-12-08 DIAGNOSIS — I2510 Atherosclerotic heart disease of native coronary artery without angina pectoris: Secondary | ICD-10-CM | POA: Diagnosis not present

## 2020-12-08 DIAGNOSIS — I959 Hypotension, unspecified: Secondary | ICD-10-CM | POA: Diagnosis not present

## 2020-12-08 DIAGNOSIS — N132 Hydronephrosis with renal and ureteral calculous obstruction: Secondary | ICD-10-CM | POA: Diagnosis not present

## 2020-12-08 DIAGNOSIS — N202 Calculus of kidney with calculus of ureter: Secondary | ICD-10-CM | POA: Diagnosis not present

## 2020-12-08 DIAGNOSIS — K753 Granulomatous hepatitis, not elsewhere classified: Secondary | ICD-10-CM | POA: Diagnosis not present

## 2020-12-08 DIAGNOSIS — R531 Weakness: Secondary | ICD-10-CM | POA: Diagnosis not present

## 2020-12-08 LAB — URINALYSIS, COMPLETE (UACMP) WITH MICROSCOPIC
Bilirubin Urine: NEGATIVE
Glucose, UA: NEGATIVE mg/dL
Ketones, ur: NEGATIVE mg/dL
Leukocytes,Ua: NEGATIVE
Nitrite: NEGATIVE
Protein, ur: NEGATIVE mg/dL
Specific Gravity, Urine: 1.013 (ref 1.005–1.030)
pH: 6 (ref 5.0–8.0)

## 2020-12-08 LAB — COMPREHENSIVE METABOLIC PANEL
ALT: 15 U/L (ref 0–44)
AST: 24 U/L (ref 15–41)
Albumin: 4.1 g/dL (ref 3.5–5.0)
Alkaline Phosphatase: 77 U/L (ref 38–126)
Anion gap: 12 (ref 5–15)
BUN: 24 mg/dL — ABNORMAL HIGH (ref 8–23)
CO2: 21 mmol/L — ABNORMAL LOW (ref 22–32)
Calcium: 9 mg/dL (ref 8.9–10.3)
Chloride: 107 mmol/L (ref 98–111)
Creatinine, Ser: 1.34 mg/dL — ABNORMAL HIGH (ref 0.44–1.00)
GFR, Estimated: 41 mL/min — ABNORMAL LOW (ref >60)
Glucose, Bld: 136 mg/dL — ABNORMAL HIGH (ref 70–99)
Potassium: 4.5 mmol/L (ref 3.5–5.1)
Sodium: 140 mmol/L (ref 135–145)
Total Bilirubin: 1.1 mg/dL (ref 0.3–1.2)
Total Protein: 7.3 g/dL (ref 6.5–8.1)

## 2020-12-08 LAB — CBC
HCT: 30.9 % — ABNORMAL LOW (ref 36.0–46.0)
Hemoglobin: 10 g/dL — ABNORMAL LOW (ref 12.0–15.0)
MCH: 31.6 pg (ref 26.0–34.0)
MCHC: 32.4 g/dL (ref 30.0–36.0)
MCV: 97.8 fL (ref 80.0–100.0)
Platelets: 202 10*3/uL (ref 150–400)
RBC: 3.16 MIL/uL — ABNORMAL LOW (ref 3.87–5.11)
RDW: 12.5 % (ref 11.5–15.5)
WBC: 10.3 10*3/uL (ref 4.0–10.5)
nRBC: 0 % (ref 0.0–0.2)

## 2020-12-08 LAB — LIPASE, BLOOD: Lipase: 37 U/L (ref 11–51)

## 2020-12-08 MED ORDER — METOCLOPRAMIDE HCL 5 MG/ML IJ SOLN
10.0000 mg | Freq: Once | INTRAMUSCULAR | Status: AC
  Administered 2020-12-08: 10 mg via INTRAVENOUS
  Filled 2020-12-08: qty 2

## 2020-12-08 MED ORDER — SODIUM CHLORIDE 0.9 % IV BOLUS
1000.0000 mL | Freq: Once | INTRAVENOUS | Status: AC
  Administered 2020-12-08: 1000 mL via INTRAVENOUS

## 2020-12-08 MED ORDER — ACETAMINOPHEN 325 MG PO TABS
650.0000 mg | ORAL_TABLET | Freq: Once | ORAL | Status: AC
  Administered 2020-12-08: 650 mg via ORAL
  Filled 2020-12-08: qty 2

## 2020-12-08 MED ORDER — ONDANSETRON HCL 4 MG/2ML IJ SOLN
4.0000 mg | Freq: Once | INTRAMUSCULAR | Status: AC | PRN
  Administered 2020-12-08: 4 mg via INTRAVENOUS
  Filled 2020-12-08: qty 2

## 2020-12-08 MED ORDER — KETOROLAC TROMETHAMINE 30 MG/ML IJ SOLN
15.0000 mg | Freq: Once | INTRAMUSCULAR | Status: AC
  Administered 2020-12-08: 15 mg via INTRAVENOUS
  Filled 2020-12-08: qty 1

## 2020-12-08 NOTE — ED Notes (Addendum)
2 unsuccessful blood draws, phlebotomist called. Pt is no longer vomiting after 2nd dose of zofran

## 2020-12-08 NOTE — ED Notes (Signed)
MD aware of BP 195/75. Pt states she missed her dose of BP meds this AM.

## 2020-12-08 NOTE — ED Triage Notes (Addendum)
Pt and husband having upset stomach and vomiting x4hrs, c/o R flank pain. Vomiting in triage

## 2020-12-08 NOTE — ED Notes (Signed)
Pt assisted with laying in recliner, awaiting lab to draw blood, blanket given

## 2020-12-08 NOTE — Discharge Instructions (Addendum)
Please follow-up with urology in 1 week.  If you develop fevers or are unable to eat or drink, please return to the emergency department.  You can take Motrin for pain.

## 2020-12-08 NOTE — ED Provider Notes (Signed)
[facility]  ____________________________________________   Event Date/Time   First MD Initiated Contact with Patient 12/08/20 [phone]     (approximate)  I have reviewed the triage vital signs and the nursing notes.   HISTORY  Chief Complaint Vomiting    HPI April [name] is a 75 y.o. female past medical history of pulmonary embolism not on anticoagulation, CAD status post stent, hyperlipidemia hypertension presents with right flank pain and nausea vomiting.  Symptoms started acutely around 1 AM.  Patient had multiple episodes of nonbloody nonbilious emesis.  Pain is located in the right flank.  She denies any urinary symptoms.  She denies fevers or chills.  No abdominal pain.  Has no history of similar.  Has had her gallbladder and appendix removed in the past.         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  [name], MD   Pulmonary embolism ([place]) 2016   Vertigo 1990   "inner ear"    Patient Active Problem List   Diagnosis Date Noted   HLD (hyperlipidemia) 01/11/2018   Microcalcification of right breast on mammogram 05/27/2017   Prothrombin gene mutation ([place]) 11/25/2015   Pulmonary embolism ([place]) 04/14/2015   Arthritis involving multiple sites 07/03/2014   Coronary artery disease 12/22/2013   Complete rupture of rotator cuff 07/21/2013   Disorder of bursae and tendons in shoulder region 07/21/2013   Enthesopathy of hip region 07/21/2013   Essential hypertension 07/21/2013   Plantar fascial fibromatosis 07/21/2013   Pure hypercholesterolemia 07/21/2013    Past Surgical History:  Procedure Laterality Date   ABDOMINAL HYSTERECTOMY     APPENDECTOMY     CARDIAC  CATHETERIZATION  2005   CATARACT EXTRACTION W/PHACO Left 02/08/2019   Procedure: CATARACT EXTRACTION PHACO AND INTRAOCULAR LENS PLACEMENT (IOC) LEFT 00:40.0  20.6%  8.26;  Surgeon: Birder Robson, MD;  Location: [place];  Service: Ophthalmology;  Laterality: Left;   CATARACT EXTRACTION W/PHACO Right 04/26/2019   Procedure: CATARACT EXTRACTION PHACO AND INTRAOCULAR LENS PLACEMENT (IOC) RIGHT 6.83 00:46.0;  Surgeon: Birder Robson, MD;  Location: [place];  Service: Ophthalmology;  Laterality: Right;   CHOLECYSTECTOMY     COLONOSCOPY  2017   FOOT SURGERY      Prior to Admission medications   Medication Sig Start Date End Date Taking? Authorizing Provider  acetaminophen (TYLENOL) 325 MG tablet Take 500 mg by mouth every 6 (six) hours as needed.     [provider]  aspirin EC 81 MG tablet Take 81 mg by mouth. 09/24/05   [provider]  atenolol (TENORMIN) 25 MG tablet Take 1 tablet by mouth daily. 09/22/14   [provider]  atorvastatin (LIPITOR) 40 MG tablet Take 40 mg by mouth daily.    [provider]  calcium citrate-vitamin D (CITRACAL+D) 315-200 MG-UNIT tablet Take 1 tablet by mouth 2 (two) times daily.    [provider]  Cholecalciferol (VITAMIN D3) 2000 UNITS capsule Take 1 capsule by mouth daily.    [provider]  EPINEPHrine (EPIPEN 2-PAK) 0.3 mg/0.3 mL IJ SOAJ injection Inject 0.3 mLs (0.3 mg total)  into the muscle as needed for anaphylaxis. 10/31/19   Harvest Dark, MD  predniSONE (DELTASONE) 20 MG tablet Take 2 tablets (40 mg total) by mouth daily. 10/31/19   Harvest Dark, MD    Allergies Antihistamines, chlorpheniramine-type; Ciprofloxacin; Dristan cold [chlorphen-pe-acetaminophen]; and Ginger  Family History  Problem Relation Age of Onset   Stroke Mother    CAD Father    Colon cancer Other        paternal great grandfather   Breast cancer Neg Hx     Social History Social History    Tobacco Use   Smoking status: Never   Smokeless tobacco: Never  Vaping Use   Vaping Use: Never used  Substance Use Topics   Alcohol use: No   Drug use: No    Review of Systems   Review of Systems  Constitutional:  Positive for appetite change. Negative for chills, fatigue and fever.  Respiratory:  Negative for shortness of breath.   Cardiovascular:  Negative for chest pain.  Gastrointestinal:  Positive for nausea and vomiting. Negative for abdominal pain.  Genitourinary:  Positive for flank pain. Negative for difficulty urinating, dysuria and hematuria.  All other systems reviewed and are negative.  Physical Exam Updated Vital Signs BP (!) 163/54   Pulse 76   Temp 98.1 F (36.7 C) (Oral)   Resp 18   Ht '5\' 7"'$  (1.702 m)   Wt 85 kg   SpO2 100%   BMI 29.35 kg/m   Physical Exam Vitals and nursing note reviewed.  Constitutional:      General: She is not in acute distress.    Appearance: Normal appearance. She is not toxic-appearing.  HENT:     Head: Normocephalic and atraumatic.     Nose: Nose normal. No congestion.     Mouth/Throat:     Mouth: Mucous membranes are dry.  Eyes:     General: No scleral icterus.    Conjunctiva/sclera: Conjunctivae normal.  Cardiovascular:     Rate and Rhythm: Normal rate and regular rhythm.  Pulmonary:     Effort: Pulmonary effort is normal. No respiratory distress.     Breath sounds: Normal breath sounds. No wheezing.  Abdominal:     Palpations: Abdomen is soft.     Tenderness: There is no abdominal tenderness. There is right CVA tenderness. There is no guarding.  Musculoskeletal:        General: No swelling, tenderness, deformity or signs of injury. Normal range of motion.     Cervical back: Neck supple. No rigidity.  Skin:    General: Skin is warm and dry.     Coloration: Skin is not jaundiced.  Neurological:     General: No focal deficit present.     Mental Status: She is alert and oriented to person, place, and time.   Psychiatric:        Mood and Affect: Mood normal.        Behavior: Behavior normal.     LABS (all labs ordered are listed, but only abnormal results are displayed)  Labs Reviewed  CBC - Abnormal; Notable for the following components:      Result Value   RBC 3.16 (*)    Hemoglobin 10.0 (*)    HCT 30.9 (*)    All other components within normal limits  URINALYSIS, COMPLETE (UACMP) WITH MICROSCOPIC - Abnormal; Notable for the following components:   Color, Urine YELLOW (*)    APPearance CLEAR (*)    Hgb urine dipstick MODERATE (*)  Bacteria, UA RARE (*)    All other components within normal limits  COMPREHENSIVE METABOLIC PANEL - Abnormal; Notable for the following components:   CO2 21 (*)    Glucose, Bld 136 (*)    BUN 24 (*)    Creatinine, Ser 1.34 (*)    GFR, Estimated 41 (*)    All other components within normal limits  LIPASE, BLOOD   ____________________________________________  EKG  N/a ____________________________________________  RADIOLOGY Almeta Monas, personally viewed and evaluated these images (plain radiographs) as part of my medical decision making, as well as reviewing the written report by the radiologist.  ED MD interpretation: CT renal stone study shows a 4 mm distal right ureteral calculus    ____________________________________________   PROCEDURES  Procedure(s) performed (including Critical Care):  Procedures   ____________________________________________   INITIAL IMPRESSION / ASSESSMENT AND PLAN / ED COURSE     75 year old female presents with acute onset of right flank pain and nausea vomiting.  Vital signs notable for hypertension otherwise within normal limits.  She appears somewhat uncomfortable on exam and has right CVA tenderness and looks dry.  Her labs are notable for moderate blood in her urine.  Otherwise reassuring with stable renal function no leukocytosis.  Strongly suspect kidney stone.  CT renal study  performed which shows a 4 mm distal ureteral obstructing stone.  On repeat evaluation patient feels much better after Toradol.  She is able to tolerate p.o. and feels like she can go home.  We discussed that she needs to follow-up with urology in about 1 week given into some obstructing stone to ensure that it is passed.  We discussed return precautions for fevers or inability to tolerate p.o.  Clinical Course as of 12/08/20 1621  Sat Dec 08, 2020  0851 Hgb urine dipstickMarland Kitchen): MODERATE [KM]  1008 IMPRESSION: 1. Obstructing 4 mm distal right ureteral calculus 2. Descending and sigmoid diverticulosis 3. Coronary and Aortic Atherosclerosis (ICD10-170.0).     [KM]    Clinical Course User Index [KM] Rada Hay, MD     ____________________________________________   FINAL CLINICAL IMPRESSION(S) / ED DIAGNOSES  Final diagnoses:  Nephrolithiasis     ED Discharge Orders     None        Note:  This document was prepared using Dragon voice recognition software and may include unintentional dictation errors.    Rada Hay, MD 12/08/20 [phone]

## 2020-12-08 NOTE — ED Notes (Signed)
Pt transported to CT via stretcher at this time.  

## 2020-12-08 NOTE — ED Notes (Signed)
Pt provided with water for PO challenge. Pt tolerating well. Pt educated on using call light if nausea returns. Pt verbalizes understanding.

## 2020-12-19 ENCOUNTER — Other Ambulatory Visit: Payer: Self-pay

## 2020-12-19 ENCOUNTER — Encounter: Payer: Self-pay | Admitting: Urology

## 2020-12-19 ENCOUNTER — Ambulatory Visit (INDEPENDENT_AMBULATORY_CARE_PROVIDER_SITE_OTHER): Payer: PPO | Admitting: Urology

## 2020-12-19 VITALS — BP 151/82 | HR 65 | Ht 67.0 in | Wt 189.0 lb

## 2020-12-19 DIAGNOSIS — N200 Calculus of kidney: Secondary | ICD-10-CM | POA: Diagnosis not present

## 2020-12-19 NOTE — Progress Notes (Signed)
12/19/20 4:18 PM   April [name] 1945/05/07 [id number]  CC: Right distal ureteral stone  HPI: 75 year old female with no history of nephrolithiasis who presented to the ED on 12/08/2020 with severe right-sided flank pain and nausea.  CT showed a 4 mm right distal ureteral stone with upstream hydronephrosis, no other kidney stones.  Her pain resolved after discharge, and she suddenly had a few twinges of discomfort on the right side, but nothing similar to her original event.  Urinalysis today is completely benign with 0 WBCs, 0 RBCs, no bacteria, nitrate negative, no leukocytes.  She denies any nausea or gross hematuria.   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  [name], MD   Pulmonary embolism ([place]) 2016   Vertigo 1990   "inner ear"    Surgical History: Past Surgical History:  Procedure Laterality Date   ABDOMINAL HYSTERECTOMY     APPENDECTOMY     CARDIAC CATHETERIZATION  2005   CATARACT EXTRACTION W/PHACO Left 02/08/2019   Procedure: CATARACT EXTRACTION PHACO AND INTRAOCULAR LENS PLACEMENT (IOC) LEFT 00:40.0  20.6%  8.26;  Surgeon: Birder Robson, MD;  Location: [place];  Service: Ophthalmology;  Laterality: Left;   CATARACT EXTRACTION W/PHACO Right 04/26/2019   Procedure: CATARACT EXTRACTION PHACO AND INTRAOCULAR LENS PLACEMENT (IOC) RIGHT 6.83 00:46.0;  Surgeon: Birder Robson, MD;  Location: [place];  Service: Ophthalmology;  Laterality: Right;   CHOLECYSTECTOMY     COLONOSCOPY  2017   FOOT SURGERY        Family History: Family History  Problem Relation Age of Onset   Stroke Mother    CAD Father    Colon cancer Other        paternal  great grandfather   Breast cancer Neg Hx     Social History:  reports that she has never smoked. She has never used smokeless tobacco. She reports that she does not drink alcohol and does not use drugs.  Physical Exam: BP (!) 151/82 (BP Location: Left Arm, Patient Position: Sitting, Cuff Size: Large)   Pulse 65   Ht '5\' 7"'$  (1.702 m)   Wt 189 lb (85.7 kg)   BMI 29.60 kg/m    Constitutional:  Alert and oriented, No acute distress. Cardiovascular: No clubbing, cyanosis, or edema. Respiratory: Normal respiratory effort, no increased work of breathing. GI: Abdomen is soft, nontender, nondistended, no abdominal masses  Laboratory Data: Reviewed, see HPI  Pertinent Imaging: I have personally viewed and interpreted the CT dated 8/13 showing a 3 to 4 mm right distal ureteral stone with hydronephrosis, no other renal stones.  Assessment & Plan:   75 year old female with likely spontaneously passed 3 to 4 mm right distal ureteral stone, symptoms essentially resolved and urinalysis today benign.  Return precautions discussed including recurrence of flank pain or gross hematuria.  We discussed general stone prevention strategies including adequate hydration with goal of producing 2.5 L of urine daily, increasing citric acid intake, increasing calcium intake during high oxalate meals, minimizing animal protein, and decreasing salt intake. Information about dietary recommendations given today.   Follow-up with urology  as needed   Nickolas Madrid, MD 12/19/2020  [facility] [address] [place], [place] [address] [phone]

## 2020-12-19 NOTE — Patient Instructions (Signed)
Dietary Guidelines to Help Prevent Kidney Stones Kidney stones are deposits of minerals and salts that form inside your kidneys. Your risk of developing kidney stones may be greater depending on your diet, your lifestyle, the medicines you take, and whether you have certain medical conditions. Most people can lower their chances of developing kidney stones by following the instructions below. Your dietitian may give you more specific instructions depending on your overall health and the type of kidney stones you tend to develop. What are tips for following this plan? Reading food labels  Choose foods with "no salt added" or "low-salt" labels. Limit your salt (sodium) intake to less than 1,500 mg a day. Choose foods with calcium for each meal and snack. Try to eat about 300 mg of calcium at each meal. Foods that contain 200-500 mg of calcium a serving include: 8 oz (237 mL) of milk, calcium-fortifiednon-dairy milk, and calcium-fortifiedfruit juice. Calcium-fortified means that calcium has been added to these drinks. 8 oz (237 mL) of kefir, yogurt, and soy yogurt. 4 oz (114 g) of tofu. 1 oz (28 g) of cheese. 1 cup (150 g) of dried figs. 1 cup (91 g) of cooked broccoli. One 3 oz (85 g) can of sardines or mackerel. Most people need 1,000-1,500 mg of calcium a day. Talk to your dietitian about how much calcium is recommended for you. Shopping Buy plenty of fresh fruits and vegetables. Most people do not need to avoid fruits and vegetables, even if these foods contain nutrients that may contribute to kidney stones. When shopping for convenience foods, choose: Whole pieces of fruit. Pre-made salads with dressing on the side. Low-fat fruit and yogurt smoothies. Avoid buying frozen meals or prepared deli foods. These can be high in sodium. Look for foods with live cultures, such as yogurt and kefir. Choose high-fiber grains, such as whole-wheat breads, oat bran, and wheat cereals. Cooking Do not add  salt to food when cooking. Place a salt shaker on the table and allow each person to add his or her own salt to taste. Use vegetable protein, such as beans, textured vegetable protein (TVP), or tofu, instead of meat in pasta, casseroles, and soups. Meal planning Eat less salt, if told by your dietitian. To do this: Avoid eating processed or pre-made food. Avoid eating fast food. Eat less animal protein, including cheese, meat, poultry, or fish, if told by your dietitian. To do this: Limit the number of times you have meat, poultry, fish, or cheese each week. Eat a diet free of meat at least 2 days a week. Eat only one serving each day of meat, poultry, fish, or seafood. When you prepare animal protein, cut pieces into small portion sizes. For most meat and fish, one serving is about the size of the palm of your hand. Eat at least five servings of fresh fruits and vegetables each day. To do this: Keep fruits and vegetables on hand for snacks. Eat one piece of fruit or a handful of berries with breakfast. Have a salad and fruit at lunch. Have two kinds of vegetables at dinner. Limit foods that are high in a substance called oxalate. These include: Spinach (cooked), rhubarb, beets, sweet potatoes, and Swiss chard. Peanuts. Potato chips, french fries, and baked potatoes with skin on. Nuts and nut products. Chocolate. If you regularly take a diuretic medicine, make sure to eat at least 1 or 2 servings of fruits or vegetables that are high in potassium each day. These include: Avocado. Banana. Orange, prune,   carrot, or tomato juice. Baked potato. Cabbage. Beans and split peas. Lifestyle  Drink enough fluid to keep your urine pale yellow. This is the most important thing you can do. Spread your fluid intake throughout the day. If you drink alcohol: Limit how much you use to: 0-1 drink a day for women who are not pregnant. 0-2 drinks a day for men. Be aware of how much alcohol is in your  drink. In the [place] one drink equals one 12 oz bottle of beer (355 mL), one 5 oz glass of wine (148 mL), or one 1 oz glass of hard liquor (44 mL). Lose weight if told by your health care provider. Work with your dietitian to find an eating plan and weight loss strategies that work best for you. General information Talk to your health care provider and dietitian about taking daily supplements. You may be told the following depending on your health and the cause of your kidney stones: Not to take supplements with vitamin C. To take a calcium supplement. To take a daily probiotic supplement. To take other supplements such as magnesium, fish oil, or vitamin B6. Take over-the-counter and prescription medicines only as told by your health care provider. These include supplements. What foods should I limit? Limit your intake of the following foods, or eat them as told by your dietitian. Vegetables Spinach. Rhubarb. Beets. Canned vegetables. Pickles. Olives. Baked potatoes with skin. Grains Wheat bran. Baked goods. Salted crackers. Cereals high in sugar. Meats and other proteins Nuts. Nut butters. Large portions of meat, poultry, or fish. Salted, precooked, or cured meats, such as sausages, meat loaves, and hot dogs. Dairy Cheese. Beverages Regular soft drinks. Regular vegetable juice. Seasonings and condiments Seasoning blends with salt. Salad dressings. Soy sauce. Ketchup. Barbecue sauce. Other foods Canned soups. Canned pasta sauce. Casseroles. Pizza. Lasagna. Frozen meals. Potato chips. French fries. The items listed above may not be a complete list of foods and beverages you should limit. Contact a dietitian for more information. What foods should I avoid? Talk to your dietitian about specific foods you should avoid based on the type of kidney stones you have and your overall health. Fruits Grapefruit. The item listed above may not be a complete list of foods and beverages you should  avoid. Contact a dietitian for more information. Summary Kidney stones are deposits of minerals and salts that form inside your kidneys. You can lower your risk of kidney stones by making changes to your diet. The most important thing you can do is drink enough fluid. Drink enough fluid to keep your urine pale yellow. Talk to your dietitian about how much calcium you should have each day, and eat less salt and animal protein as told by your dietitian. This information is not intended to replace advice given to you by your health care provider. Make sure you discuss any questions you have with your health care provider. Document Revised: 04/07/2019 Document Reviewed: 04/07/2019 Elsevier Patient Education  2022 Elsevier Inc.  

## 2020-12-20 LAB — URINALYSIS, COMPLETE
Bilirubin, UA: NEGATIVE
Glucose, UA: NEGATIVE
Ketones, UA: NEGATIVE
Leukocytes,UA: NEGATIVE
Nitrite, UA: NEGATIVE
Protein,UA: NEGATIVE
RBC, UA: NEGATIVE
Specific Gravity, UA: 1.02 (ref 1.005–1.030)
Urobilinogen, Ur: 0.2 mg/dL (ref 0.2–1.0)
pH, UA: 5 (ref 5.0–7.5)

## 2020-12-20 LAB — MICROSCOPIC EXAMINATION
Bacteria, UA: NONE SEEN
RBC, Urine: NONE SEEN /hpf (ref 0–2)
WBC, UA: NONE SEEN /hpf (ref 0–5)

## 2021-02-15 DIAGNOSIS — Z23 Encounter for immunization: Secondary | ICD-10-CM | POA: Diagnosis not present

## 2021-02-26 ENCOUNTER — Other Ambulatory Visit: Payer: Self-pay | Admitting: Obstetrics and Gynecology

## 2021-02-26 DIAGNOSIS — Z1231 Encounter for screening mammogram for malignant neoplasm of breast: Secondary | ICD-10-CM | POA: Diagnosis not present

## 2021-02-26 DIAGNOSIS — Z1211 Encounter for screening for malignant neoplasm of colon: Secondary | ICD-10-CM | POA: Diagnosis not present

## 2021-02-26 DIAGNOSIS — Z124 Encounter for screening for malignant neoplasm of cervix: Secondary | ICD-10-CM | POA: Diagnosis not present

## 2021-04-03 DIAGNOSIS — Z91030 Bee allergy status: Secondary | ICD-10-CM | POA: Diagnosis not present

## 2021-04-03 DIAGNOSIS — I2510 Atherosclerotic heart disease of native coronary artery without angina pectoris: Secondary | ICD-10-CM | POA: Diagnosis not present

## 2021-04-03 DIAGNOSIS — I10 Essential (primary) hypertension: Secondary | ICD-10-CM | POA: Diagnosis not present

## 2021-04-03 DIAGNOSIS — M129 Arthropathy, unspecified: Secondary | ICD-10-CM | POA: Diagnosis not present

## 2021-04-03 DIAGNOSIS — E7800 Pure hypercholesterolemia, unspecified: Secondary | ICD-10-CM | POA: Diagnosis not present

## 2021-04-03 DIAGNOSIS — N1831 Chronic kidney disease, stage 3a: Secondary | ICD-10-CM | POA: Diagnosis not present

## 2021-04-03 DIAGNOSIS — D6852 Prothrombin gene mutation: Secondary | ICD-10-CM | POA: Diagnosis not present

## 2021-04-04 DIAGNOSIS — L570 Actinic keratosis: Secondary | ICD-10-CM | POA: Diagnosis not present

## 2021-04-04 DIAGNOSIS — L853 Xerosis cutis: Secondary | ICD-10-CM | POA: Diagnosis not present

## 2021-04-04 DIAGNOSIS — D225 Melanocytic nevi of trunk: Secondary | ICD-10-CM | POA: Diagnosis not present

## 2021-04-04 DIAGNOSIS — D2272 Melanocytic nevi of left lower limb, including hip: Secondary | ICD-10-CM | POA: Diagnosis not present

## 2021-04-04 DIAGNOSIS — Z85828 Personal history of other malignant neoplasm of skin: Secondary | ICD-10-CM | POA: Diagnosis not present

## 2021-04-04 DIAGNOSIS — D2262 Melanocytic nevi of left upper limb, including shoulder: Secondary | ICD-10-CM | POA: Diagnosis not present

## 2021-04-08 ENCOUNTER — Other Ambulatory Visit: Payer: Self-pay

## 2021-04-08 ENCOUNTER — Ambulatory Visit
Admission: RE | Admit: 2021-04-08 | Discharge: 2021-04-08 | Disposition: A | Discharge status: Home / Self Care | Payer: PPO | Source: Ambulatory Visit | Attending: Obstetrics and Gynecology | Admitting: Obstetrics and Gynecology

## 2021-04-08 DIAGNOSIS — Z1231 Encounter for screening mammogram for malignant neoplasm of breast: Secondary | ICD-10-CM | POA: Diagnosis not present

## 2021-05-21 DIAGNOSIS — I10 Essential (primary) hypertension: Secondary | ICD-10-CM | POA: Diagnosis not present

## 2021-05-21 DIAGNOSIS — I2510 Atherosclerotic heart disease of native coronary artery without angina pectoris: Secondary | ICD-10-CM | POA: Diagnosis not present

## 2021-05-21 DIAGNOSIS — E785 Hyperlipidemia, unspecified: Secondary | ICD-10-CM | POA: Diagnosis not present

## 2021-05-21 DIAGNOSIS — I2699 Other pulmonary embolism without acute cor pulmonale: Secondary | ICD-10-CM | POA: Diagnosis not present

## 2021-05-28 DIAGNOSIS — I2510 Atherosclerotic heart disease of native coronary artery without angina pectoris: Secondary | ICD-10-CM | POA: Diagnosis not present

## 2021-05-28 DIAGNOSIS — E785 Hyperlipidemia, unspecified: Secondary | ICD-10-CM | POA: Diagnosis not present

## 2021-05-28 DIAGNOSIS — I10 Essential (primary) hypertension: Secondary | ICD-10-CM | POA: Diagnosis not present

## 2021-05-28 DIAGNOSIS — I2699 Other pulmonary embolism without acute cor pulmonale: Secondary | ICD-10-CM | POA: Diagnosis not present

## 2021-06-04 DIAGNOSIS — J0100 Acute maxillary sinusitis, unspecified: Secondary | ICD-10-CM | POA: Diagnosis not present

## 2021-06-04 DIAGNOSIS — J029 Acute pharyngitis, unspecified: Secondary | ICD-10-CM | POA: Diagnosis not present

## 2021-07-11 IMAGING — US US BREAST*R* LIMITED INC AXILLA
2 series · 13 of 24 positions shown · non-contrast
Comparison: Previous exam(s).

CLINICAL DATA: Patient returns after screening study for evaluation
of 2 possible masses in the RIGHT breast.

EXAM:
ULTRASOUND OF THE RIGHT BREAST

[Series 1: us breast*right* limited inc axilla · 0.06mm/px · 5 of 10 slices shown (1 of 2)]
[im 1/10]
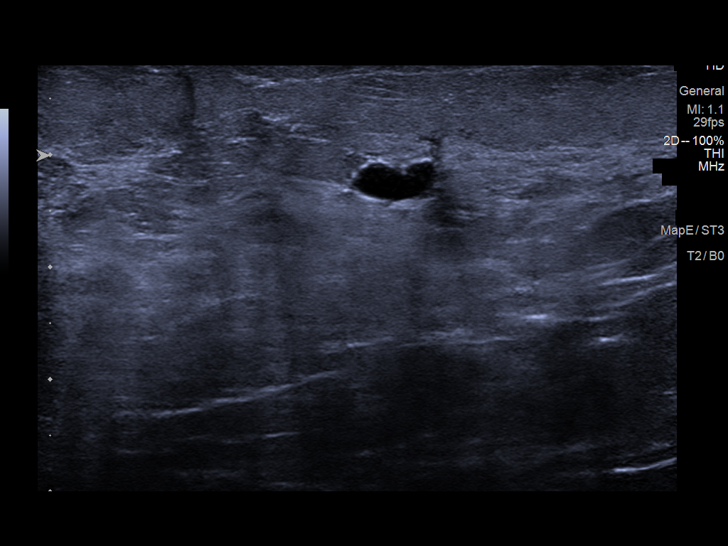
[im 3/10]
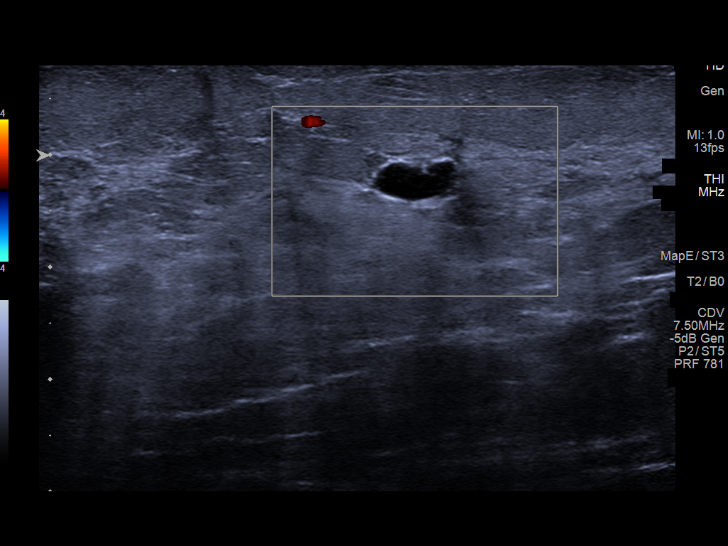
[im 5/10]
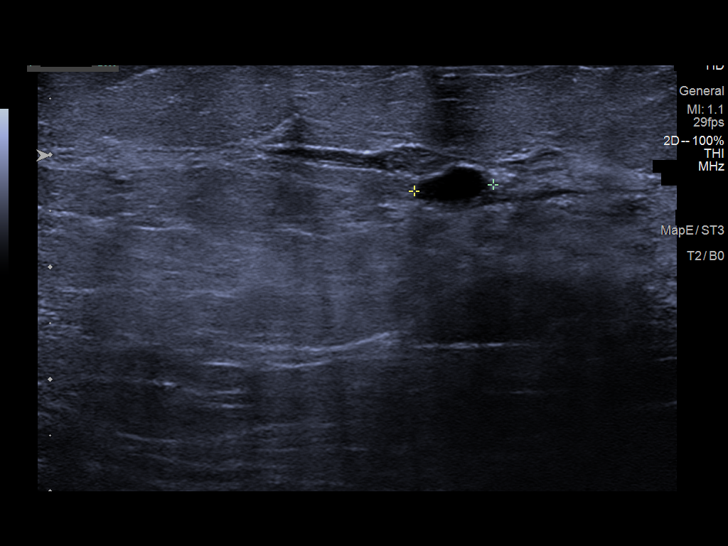
[im 7/10]
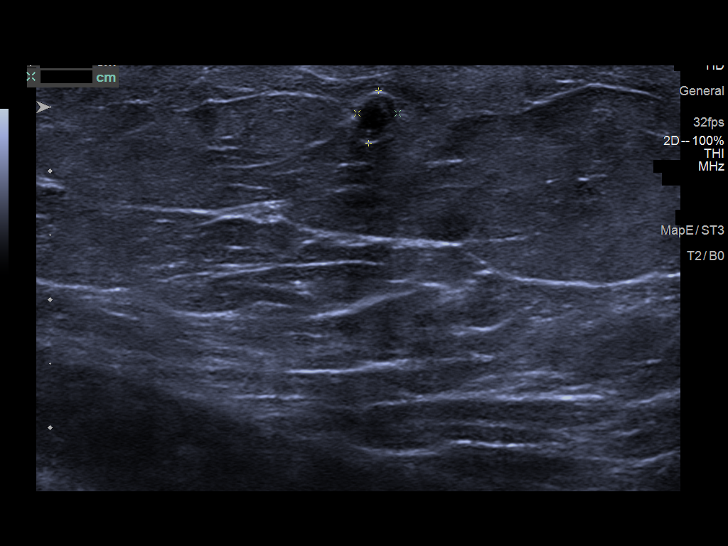
[im 9/10]
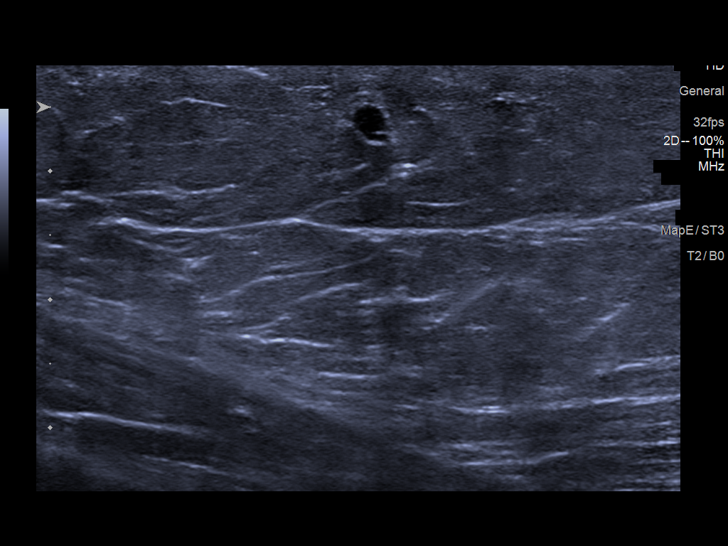

[Series 2: us breast*right* limited inc axilla · 0.06mm/px · 8 of 14 slices shown (2 of 2)]
[im 1/14]
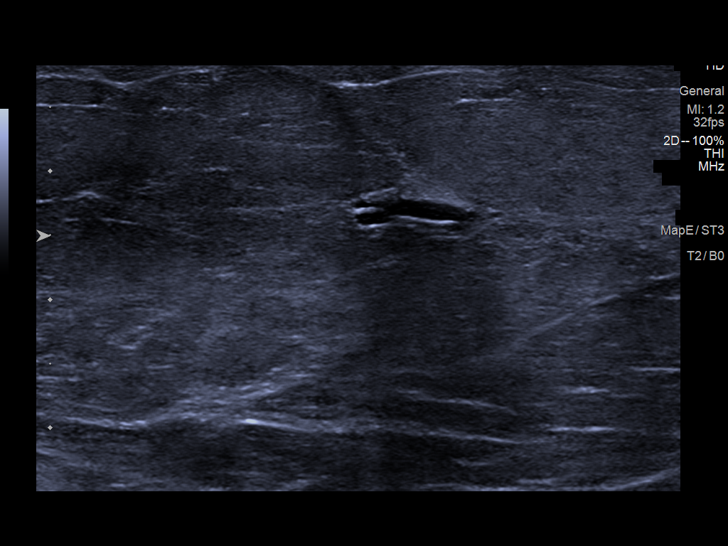
[im 3/14]
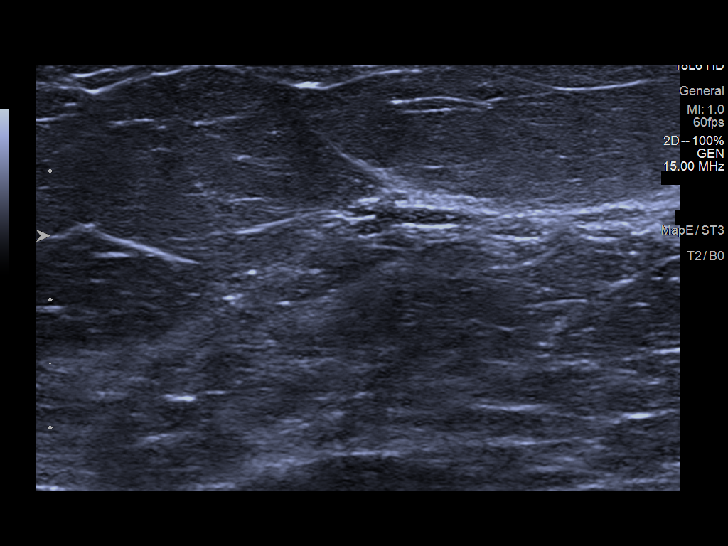
[im 4/14]
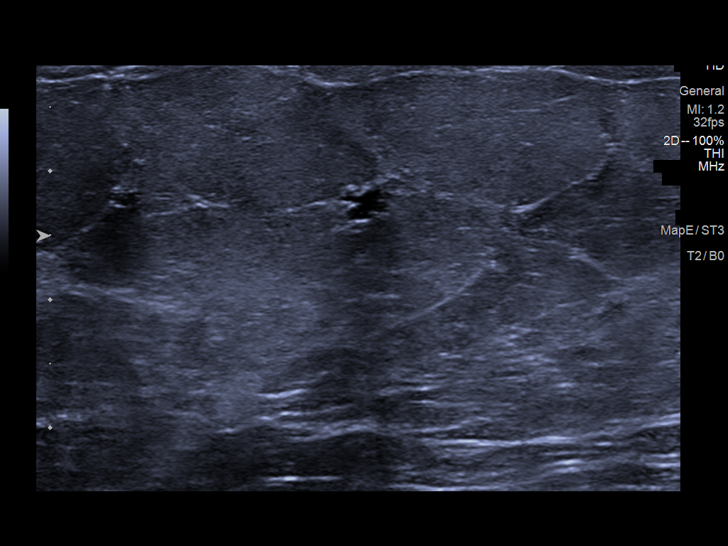
[im 6/14]
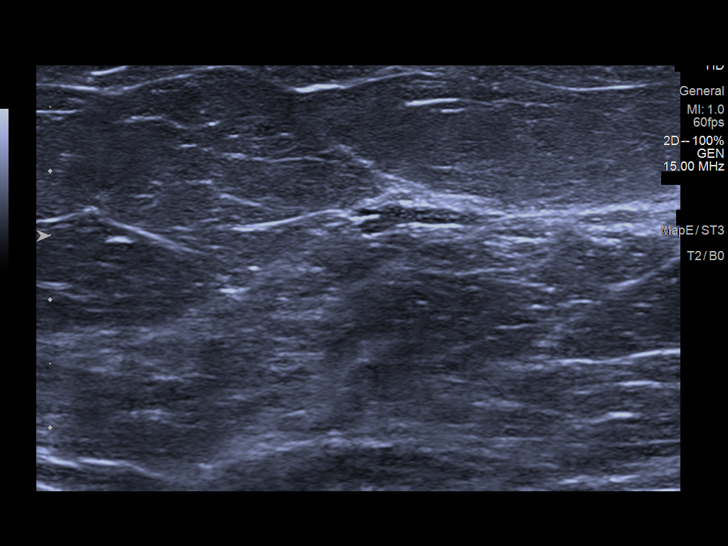
[im 8/14]
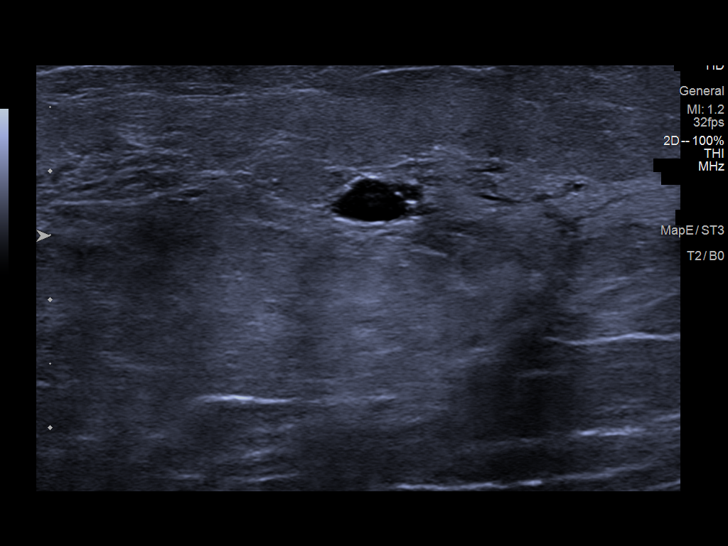
[im 10/14]
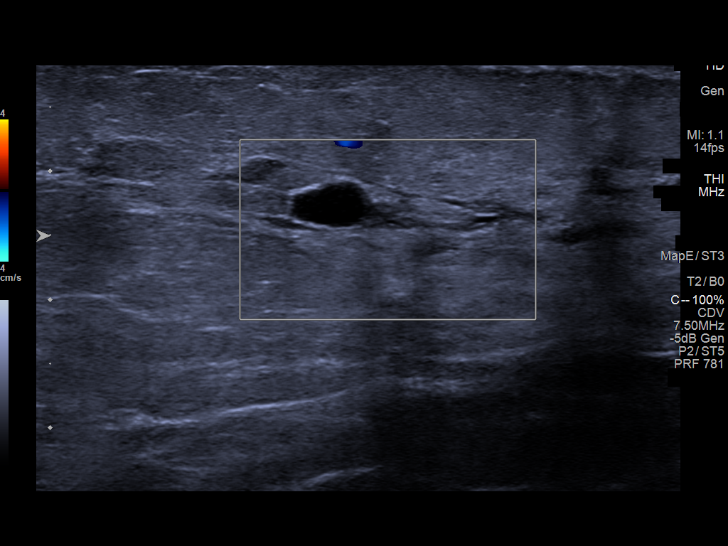
[im 12/14]
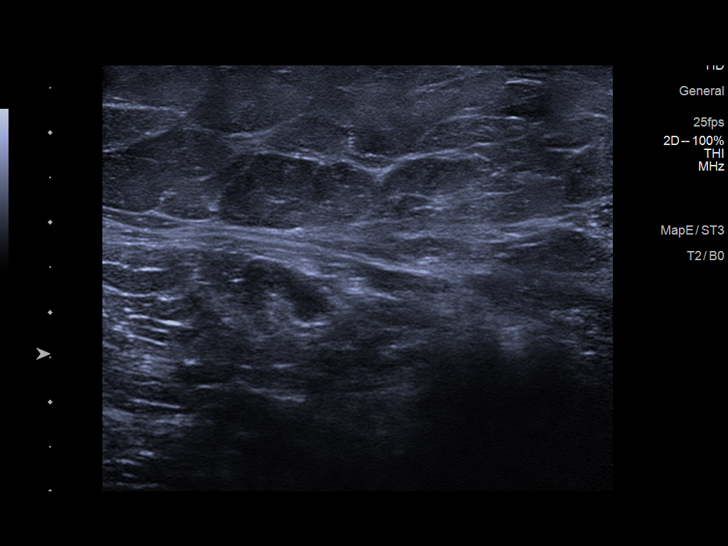
[im 14/14]
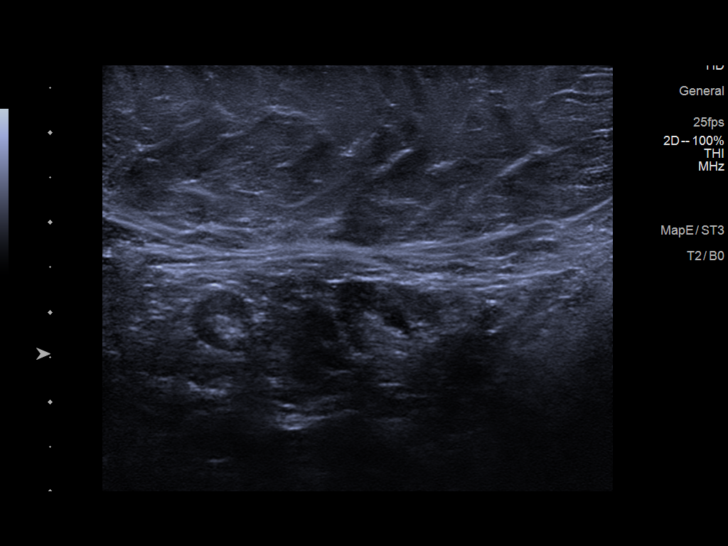

[13 of 24 positions shown; findings below may reference images not displayed]

FINDINGS: On physical exam, I palpate no abnormality in the UPPER portion of
the RIGHT breast.

Targeted ultrasound is performed, showing an oval taller than wide
mass with microlobulated margins in the 11 o'clock location of the
RIGHT breast 8 centimeters from the nipple. Mass is 0.4 x 0.3 x
centimeters. There is no associated internal blood flow.

In the 11 o'clock location 5 centimeters from the nipple, there is a
tubular mass with internal echoes measuring 1.1 x 0.3 x
centimeters. Findings are consistent with mildly dilated common
branching duct. No internal blood flow identified.

Incidental note is made of a benign cyst in the 9 o'clock location 3
centimeters from the nipple which measures 0.8 x 0.4 x
centimeters.

Evaluation of the RIGHT axilla is negative for adenopathy.
IMPRESSION: 1. Indeterminate mass in the 11 o'clock location of the RIGHT breast
8 centimeters from the nipple.
2. Indeterminate dilated duct containing debris or mass in the 11
o'clock location 5 centimeters from the nipple.
3. No RIGHT axillary adenopathy.

RECOMMENDATION:
Ultrasound-guided core biopsy of masses in the 11 o'clock location
RIGHT breast 8 centimeters from the nipple and 11 o'clock location 5
centimeters from the nipple.

I have discussed the findings and recommendations with the patient.
If applicable, a reminder letter will be sent to the patient
regarding the next appointment.

BI-RADS CATEGORY  4: Suspicious.

## 2021-07-22 IMAGING — MG US  BREAST BX W/ LOC DEV 1ST LESION IMG BX SPEC US GUIDE*R*
1 series · 8 of 8 positions shown · non-contrast
Comparison: Previous exam(s).
COMPARISON: Previous exam(s).

Addendum:
CLINICAL DATA: 74-year-old female with 2 indeterminate right breast
masses.

EXAM:
ULTRASOUND GUIDED RIGHT BREAST CORE NEEDLE BIOPSY

[Series 1: MG view · 0.07mm/px · 8 of 13 slices shown]
[im 1/13]
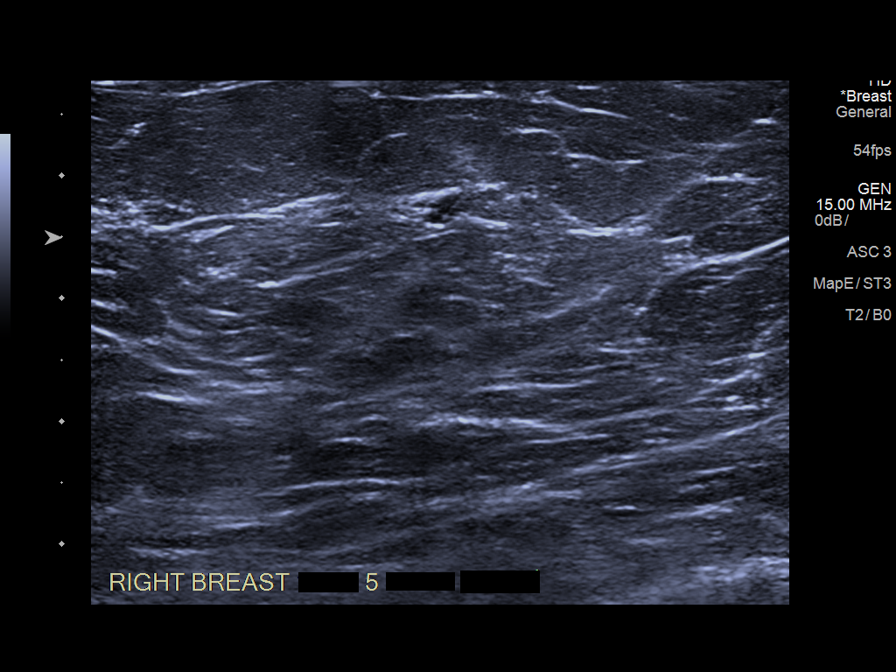
[im 2/13]
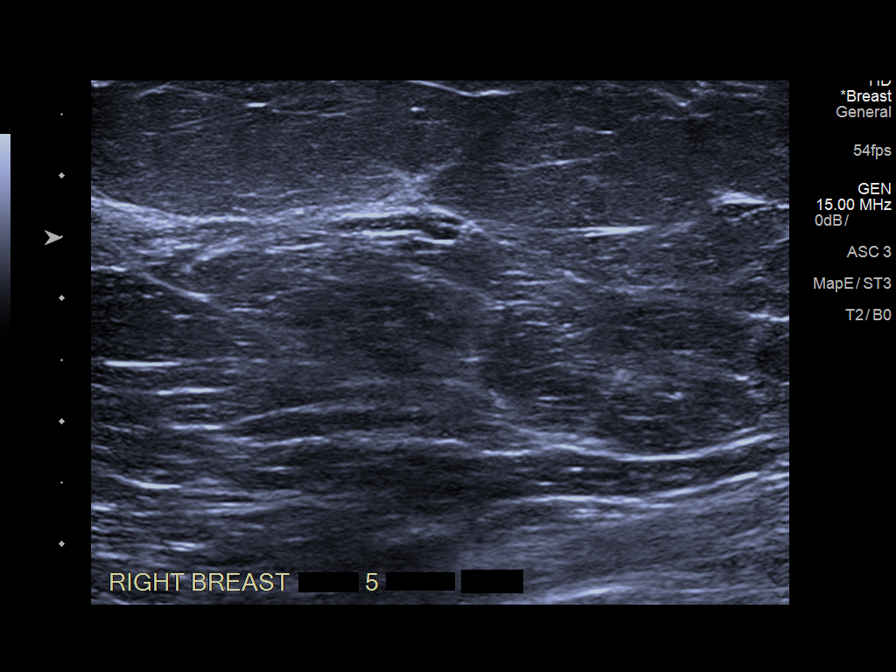
[im 4/13]
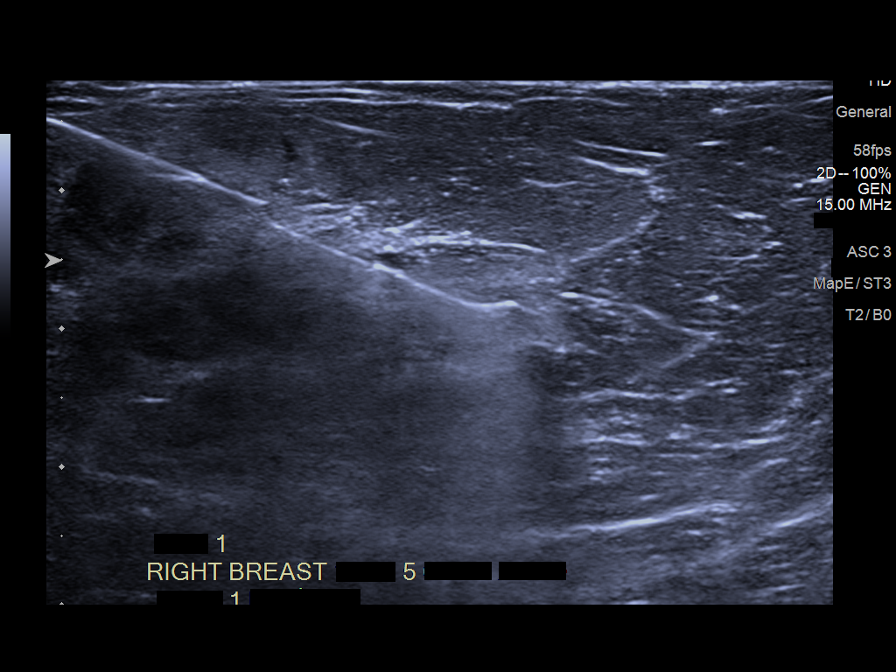
[im 6/13]
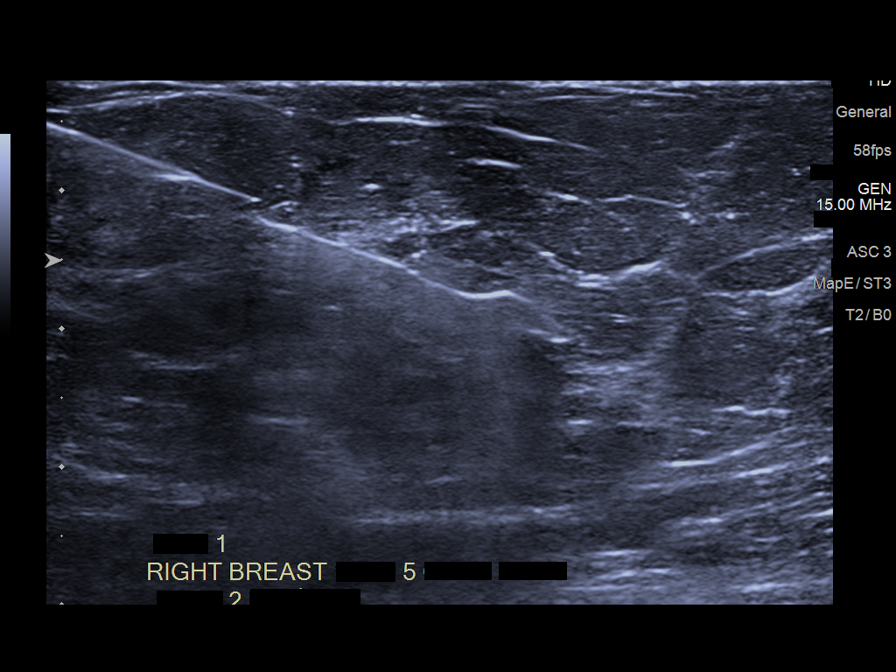
[im 7/13]
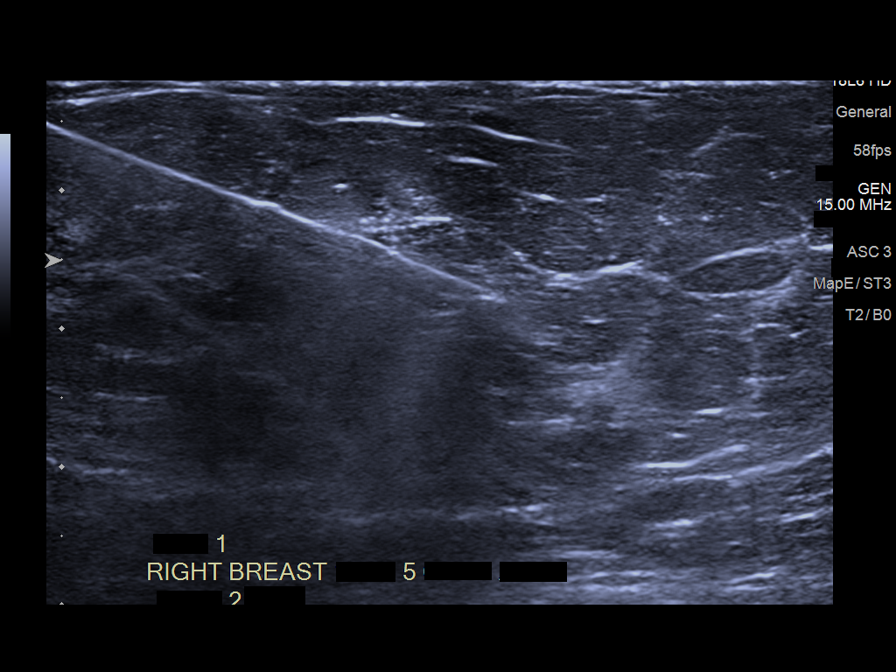
[im 9/13]
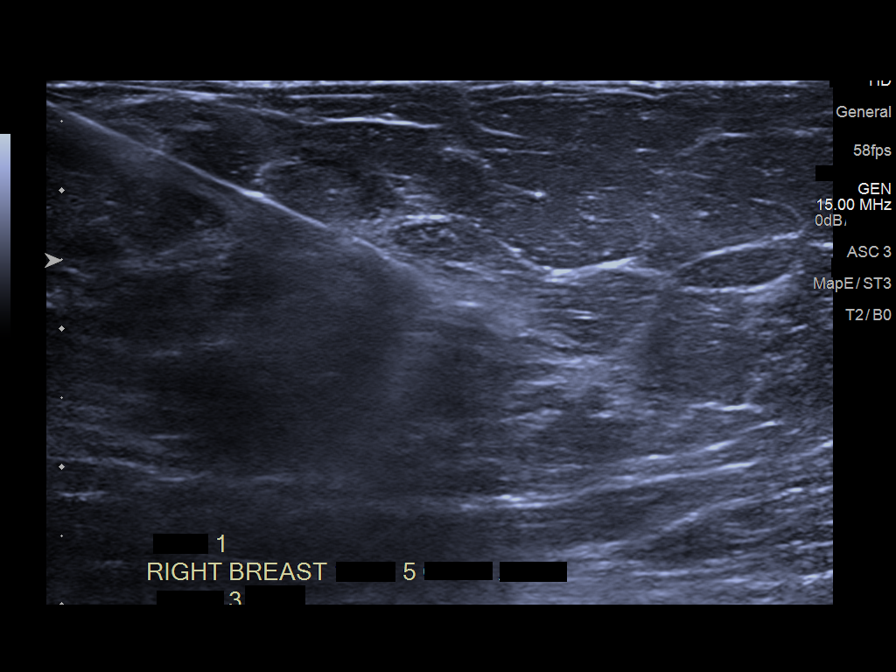
[im 11/13]
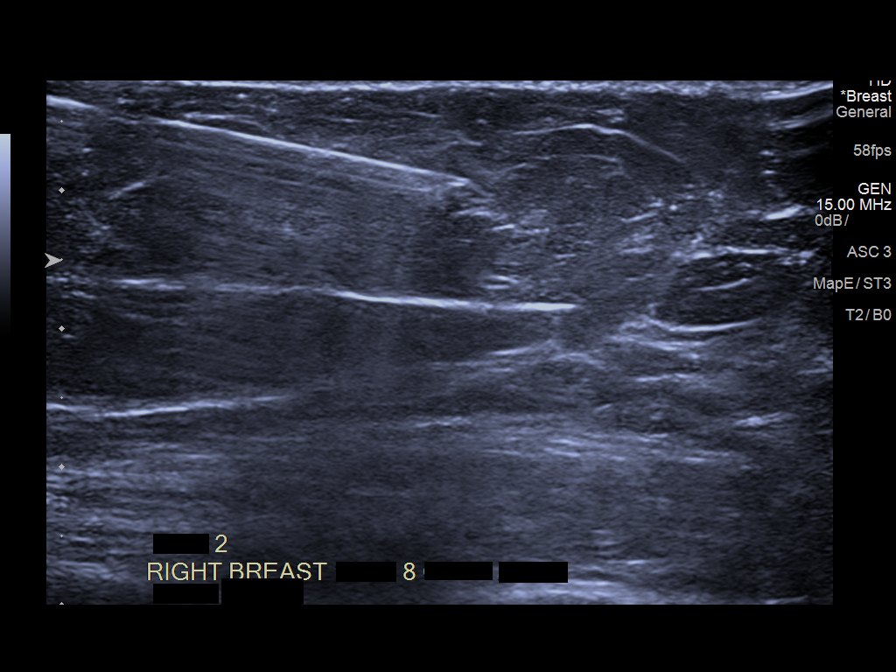
[im 13/13]
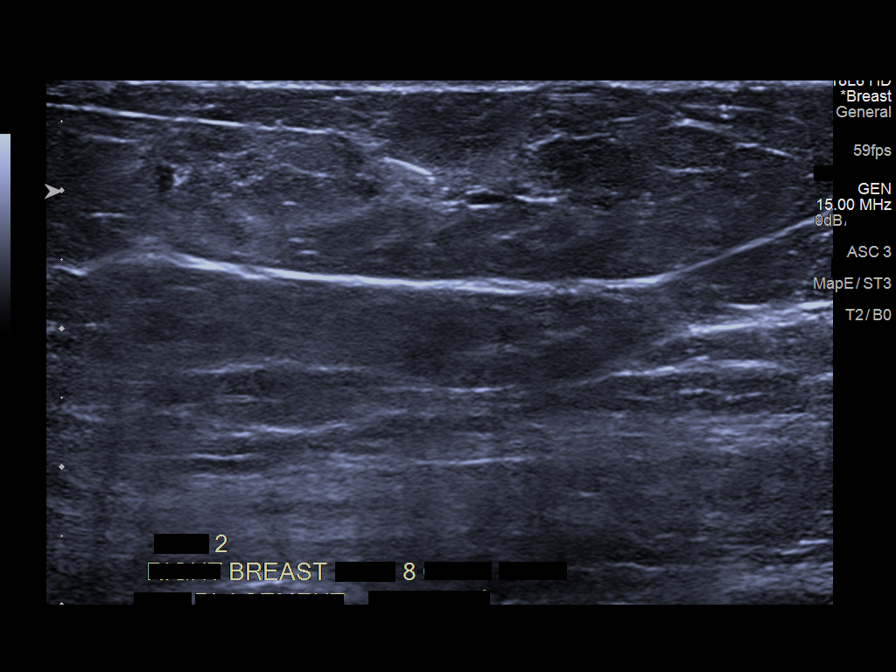

[8 of 8 positions shown; findings below may reference images not displayed]



Lesion quadrant: Upper outer quadrant

Using sterile technique and 1% Lidocaine as local anesthetic, under
direct ultrasound visualization, a 14 gauge Tiger device was
used to perform biopsy of a tubular mass at the 11 o'clock position
5 cm from the nipple using a lateral approach. At the conclusion of
the procedure a Q shaped tissue marker clip was deployed into the
biopsy cavity.

Lesion quadrant: Upper outer quadrant

Using sterile technique and 1% Lidocaine as local anesthetic, under
direct ultrasound visualization, a 14 gauge Tiger device was
used to perform biopsy of a mass at the 11 o'clock position 8 cm
from nipple using a lateral approach. At the conclusion of the
procedure and X shaped tissue marker clip was deployed into the
biopsy cavity.

Follow up 2 view mammogram was performed and dictated separately.
IMPRESSION: Ultrasound guided biopsy of the right breast x2. No apparent
complications.

ADDENDUM:
PATHOLOGY revealed: Site A. RIGHT BREAST, 11 O'CLOCK 5 CM FROM
NIPPLE (Q-SHAPED CLIP); ULTRASOUND-GUIDED CORE BIOPSY: - PORTIONS OF
THE WALL OF A DILATED DUCT OR CYST WITH INSPISSATED SECRETIONS. -
FOCAL SCLEROSING ADENOSIS. - NEGATIVE FOR ATYPIA AND MALIGNANCY.

Pathology results are CONCORDANT with imaging findings, per Dr.
Ben Atou Belfqih.

PATHOLOGY revealed: Site B. RIGHT BREAST, 11 O'CLOCK 8 CM FROM
NIPPLE (X-SHAPED CLIP); ULTRASOUND-GUIDED CORE BIOPSY: - FAT
NECROSIS, 2 MM FOCUS, AND ADJOINING ADIPOSE TISSUE. - NO BREAST
LOBULES OR DUCTS SEEN IN THIS SAMPLE.

Pathology results are CONCORDANT with imaging findings, per Dr.
Ben Atou Belfqih.

Pathology results and recommendations below were discussed with
patient by telephone on 05/02/2020. Patient reported biopsy site
within normal limits with slight tenderness at the site. Post biopsy
care instructions were reviewed, questions were answered and my
direct phone number was provided to patient. Patient was instructed
to call [HOSPITAL] if any concerns or questions arise
related to the biopsy.

Recommendation: Resume annual bilateral screening mammogram due
March 2021.

Pathology results reported by Nazareth Jumper RN on 05/03/2020.



Lesion quadrant: Upper outer quadrant

Using sterile technique and 1% Lidocaine as local anesthetic, under
direct ultrasound visualization, a 14 gauge Tiger device was
used to perform biopsy of a tubular mass at the 11 o'clock position
5 cm from the nipple using a lateral approach. At the conclusion of
the procedure a Q shaped tissue marker clip was deployed into the
biopsy cavity.

Lesion quadrant: Upper outer quadrant

Using sterile technique and 1% Lidocaine as local anesthetic, under
direct ultrasound visualization, a 14 gauge Tiger device was
used to perform biopsy of a mass at the 11 o'clock position 8 cm
from nipple using a lateral approach. At the conclusion of the
procedure and X shaped tissue marker clip was deployed into the
biopsy cavity.

Follow up 2 view mammogram was performed and dictated separately.
IMPRESSION: Ultrasound guided biopsy of the right breast x2. No apparent
complications.

## 2021-08-14 DIAGNOSIS — M216X2 Other acquired deformities of left foot: Secondary | ICD-10-CM | POA: Diagnosis not present

## 2021-08-14 DIAGNOSIS — M2042 Other hammer toe(s) (acquired), left foot: Secondary | ICD-10-CM | POA: Diagnosis not present

## 2021-08-14 DIAGNOSIS — M792 Neuralgia and neuritis, unspecified: Secondary | ICD-10-CM | POA: Diagnosis not present

## 2021-08-14 DIAGNOSIS — M2141 Flat foot [pes planus] (acquired), right foot: Secondary | ICD-10-CM | POA: Diagnosis not present

## 2021-08-14 DIAGNOSIS — G8929 Other chronic pain: Secondary | ICD-10-CM | POA: Diagnosis not present

## 2021-08-14 DIAGNOSIS — M2142 Flat foot [pes planus] (acquired), left foot: Secondary | ICD-10-CM | POA: Diagnosis not present

## 2021-08-14 DIAGNOSIS — M216X1 Other acquired deformities of right foot: Secondary | ICD-10-CM | POA: Diagnosis not present

## 2021-08-14 DIAGNOSIS — M79671 Pain in right foot: Secondary | ICD-10-CM | POA: Diagnosis not present

## 2021-08-14 DIAGNOSIS — M2041 Other hammer toe(s) (acquired), right foot: Secondary | ICD-10-CM | POA: Diagnosis not present

## 2021-08-14 DIAGNOSIS — G5781 Other specified mononeuropathies of right lower limb: Secondary | ICD-10-CM | POA: Diagnosis not present

## 2021-08-14 DIAGNOSIS — M7741 Metatarsalgia, right foot: Secondary | ICD-10-CM | POA: Diagnosis not present

## 2021-08-14 DIAGNOSIS — M545 Low back pain, unspecified: Secondary | ICD-10-CM | POA: Diagnosis not present

## 2021-09-12 DIAGNOSIS — R059 Cough, unspecified: Secondary | ICD-10-CM | POA: Diagnosis not present

## 2021-09-12 DIAGNOSIS — J069 Acute upper respiratory infection, unspecified: Secondary | ICD-10-CM | POA: Diagnosis not present

## 2021-09-12 DIAGNOSIS — R051 Acute cough: Secondary | ICD-10-CM | POA: Diagnosis not present

## 2021-09-25 DIAGNOSIS — M7741 Metatarsalgia, right foot: Secondary | ICD-10-CM | POA: Diagnosis not present

## 2021-09-25 DIAGNOSIS — M216X2 Other acquired deformities of left foot: Secondary | ICD-10-CM | POA: Diagnosis not present

## 2021-09-25 DIAGNOSIS — M79671 Pain in right foot: Secondary | ICD-10-CM | POA: Diagnosis not present

## 2021-09-25 DIAGNOSIS — M2141 Flat foot [pes planus] (acquired), right foot: Secondary | ICD-10-CM | POA: Diagnosis not present

## 2021-09-25 DIAGNOSIS — G8929 Other chronic pain: Secondary | ICD-10-CM | POA: Diagnosis not present

## 2021-09-25 DIAGNOSIS — G5781 Other specified mononeuropathies of right lower limb: Secondary | ICD-10-CM | POA: Diagnosis not present

## 2021-09-25 DIAGNOSIS — M2042 Other hammer toe(s) (acquired), left foot: Secondary | ICD-10-CM | POA: Diagnosis not present

## 2021-09-25 DIAGNOSIS — M792 Neuralgia and neuritis, unspecified: Secondary | ICD-10-CM | POA: Diagnosis not present

## 2021-09-25 DIAGNOSIS — M2041 Other hammer toe(s) (acquired), right foot: Secondary | ICD-10-CM | POA: Diagnosis not present

## 2021-09-25 DIAGNOSIS — M2142 Flat foot [pes planus] (acquired), left foot: Secondary | ICD-10-CM | POA: Diagnosis not present

## 2021-09-25 DIAGNOSIS — M545 Low back pain, unspecified: Secondary | ICD-10-CM | POA: Diagnosis not present

## 2021-09-25 DIAGNOSIS — M216X1 Other acquired deformities of right foot: Secondary | ICD-10-CM | POA: Diagnosis not present

## 2021-09-27 DIAGNOSIS — R399 Unspecified symptoms and signs involving the genitourinary system: Secondary | ICD-10-CM | POA: Diagnosis not present

## 2021-09-27 DIAGNOSIS — N390 Urinary tract infection, site not specified: Secondary | ICD-10-CM | POA: Diagnosis not present

## 2021-09-29 ENCOUNTER — Other Ambulatory Visit: Payer: Self-pay

## 2021-09-29 ENCOUNTER — Emergency Department
Admission: EM | Admit: 2021-09-29 | Discharge: 2021-09-29 | Disposition: A | Discharge status: Home / Self Care | Payer: PPO | Attending: Emergency Medicine | Admitting: Emergency Medicine

## 2021-09-29 DIAGNOSIS — R140 Abdominal distension (gaseous): Secondary | ICD-10-CM | POA: Diagnosis not present

## 2021-09-29 DIAGNOSIS — R112 Nausea with vomiting, unspecified: Secondary | ICD-10-CM | POA: Insufficient documentation

## 2021-09-29 DIAGNOSIS — I2510 Atherosclerotic heart disease of native coronary artery without angina pectoris: Secondary | ICD-10-CM | POA: Diagnosis not present

## 2021-09-29 LAB — URINALYSIS, ROUTINE W REFLEX MICROSCOPIC
Bilirubin Urine: NEGATIVE
Glucose, UA: NEGATIVE mg/dL
Hgb urine dipstick: NEGATIVE
Ketones, ur: NEGATIVE mg/dL
Leukocytes,Ua: NEGATIVE
Nitrite: NEGATIVE
Protein, ur: NEGATIVE mg/dL
Specific Gravity, Urine: 1.015 (ref 1.005–1.030)
pH: 5 (ref 5.0–8.0)

## 2021-09-29 LAB — CBC WITH DIFFERENTIAL/PLATELET
Abs Immature Granulocytes: 0.03 10*3/uL (ref 0.00–0.07)
Basophils Absolute: 0 10*3/uL (ref 0.0–0.1)
Basophils Relative: 0 %
Eosinophils Absolute: 0.1 10*3/uL (ref 0.0–0.5)
Eosinophils Relative: 1 %
HCT: 40.4 % (ref 36.0–46.0)
Hemoglobin: 12.8 g/dL (ref 12.0–15.0)
Immature Granulocytes: 0 %
Lymphocytes Relative: 6 %
Lymphs Abs: 0.4 10*3/uL — ABNORMAL LOW (ref 0.7–4.0)
MCH: 29.8 pg (ref 26.0–34.0)
MCHC: 31.7 g/dL (ref 30.0–36.0)
MCV: 94 fL (ref 80.0–100.0)
Monocytes Absolute: 0.4 10*3/uL (ref 0.1–1.0)
Monocytes Relative: 6 %
Neutro Abs: 6.3 10*3/uL (ref 1.7–7.7)
Neutrophils Relative %: 87 %
Platelets: 217 10*3/uL (ref 150–400)
RBC: 4.3 MIL/uL (ref 3.87–5.11)
RDW: 12.8 % (ref 11.5–15.5)
WBC: 7.3 10*3/uL (ref 4.0–10.5)
nRBC: 0 % (ref 0.0–0.2)

## 2021-09-29 LAB — COMPREHENSIVE METABOLIC PANEL
ALT: 16 U/L (ref 0–44)
AST: 20 U/L (ref 15–41)
Albumin: 4.1 g/dL (ref 3.5–5.0)
Alkaline Phosphatase: 77 U/L (ref 38–126)
Anion gap: 6 (ref 5–15)
BUN: 27 mg/dL — ABNORMAL HIGH (ref 8–23)
CO2: 23 mmol/L (ref 22–32)
Calcium: 9.4 mg/dL (ref 8.9–10.3)
Chloride: 107 mmol/L (ref 98–111)
Creatinine, Ser: 1 mg/dL (ref 0.44–1.00)
GFR, Estimated: 58 mL/min — ABNORMAL LOW (ref >60)
Glucose, Bld: 133 mg/dL — ABNORMAL HIGH (ref 70–99)
Potassium: 4.2 mmol/L (ref 3.5–5.1)
Sodium: 136 mmol/L (ref 135–145)
Total Bilirubin: 0.8 mg/dL (ref 0.3–1.2)
Total Protein: 7.7 g/dL (ref 6.5–8.1)

## 2021-09-29 LAB — TROPONIN I (HIGH SENSITIVITY)
Troponin I (High Sensitivity): 6 ng/L (ref <18)
Troponin I (High Sensitivity): 7 ng/L (ref <18)

## 2021-09-29 LAB — LIPASE, BLOOD: Lipase: 37 U/L (ref 11–51)

## 2021-09-29 MED ORDER — ONDANSETRON 4 MG PO TBDP: 4.0000 mg | ORAL_TABLET | Freq: Three times a day (TID) | ORAL | 0 refills | Status: DC | PRN

## 2021-09-29 MED ORDER — ONDANSETRON HCL 4 MG/2ML IJ SOLN
4.0000 mg | Freq: Once | INTRAMUSCULAR | Status: AC
  Administered 2021-09-29: 4 mg via INTRAVENOUS
  Filled 2021-09-29: qty 2

## 2021-09-29 MED ORDER — LACTATED RINGERS IV BOLUS
1000.0000 mL | Freq: Once | INTRAVENOUS | Status: AC
  Administered 2021-09-29: 1000 mL via INTRAVENOUS

## 2021-09-29 NOTE — ED Provider Notes (Signed)
Patient received in signout from Dr. Alfred Levins pending follow-up trops and reassessment.  Troponins are negative.  Repeat abdominal exam is soft benign.  She feels improved after IV fluids and antiemetic.  Do not feel that CT imaging clinically indicated based on her exam and presentation.  She is tolerating p.o.  Does appear stable and appropriate for outpatient follow-up.   Merlyn Lot, MD 09/29/21 [phone]

## 2021-09-29 NOTE — ED Provider Notes (Signed)
[facility] Provider Note    Event Date/Time   First MD Initiated Contact with Patient 09/29/21 0518     (approximate)   History   Nausea   HPI  April [name] is a 76 y.o. 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  Patient reports being in her usual state of health until this evening.  She reports severe nausea with several episodes of nonbloody nonbilious emesis.  Patient reports that she started taking Macrobid yesterday for a UTI.  She reports improvement of her dysuria and frequency.  She denies chest pain or shortness of breath, cough or congestion, fever or chills, abdominal pain, diarrhea, constipation.  She does report feeling bloated.  She has had several prior abdominal surgeries including cholecystectomy, appendectomy and hysterectomy.  No prior history of SBO.  She is passing flatus.     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  [name], MD   Pulmonary embolism ([place]) 2016   Vertigo 1990   "inner ear"    Past Surgical History:  Procedure Laterality Date   ABDOMINAL HYSTERECTOMY     APPENDECTOMY     BREAST BIOPSY Right 04/30/2020   11:00 5cmfn Qmarker-benign, 11:00 8 cmfn twisted X marker-benign   CARDIAC CATHETERIZATION  2005   CATARACT EXTRACTION W/PHACO Left 02/08/2019   Procedure: CATARACT EXTRACTION PHACO AND INTRAOCULAR LENS PLACEMENT (IOC) LEFT 00:40.0  20.6%  8.26;  Surgeon: Birder Robson, MD;  Location: [place];  Service: Ophthalmology;  Laterality: Left;   CATARACT EXTRACTION W/PHACO Right 04/26/2019   Procedure: CATARACT EXTRACTION PHACO  AND INTRAOCULAR LENS PLACEMENT (IOC) RIGHT 6.83 00:46.0;  Surgeon: Birder Robson, MD;  Location: [place];  Service: Ophthalmology;  Laterality: Right;   CHOLECYSTECTOMY     COLONOSCOPY  2017   FOOT SURGERY       Physical Exam   Triage Vital Signs: ED Triage Vitals  Enc Vitals Group     BP 09/29/21 0516 (!) 216/72     Pulse Rate 09/29/21 0516 74     Resp 09/29/21 0516 16     Temp 09/29/21 0516 99.1 F (37.3 C)     Temp Source 09/29/21 0516 Oral     SpO2 09/29/21 0516 99 %     Weight 09/29/21 0516 190 lb (86.2 kg)     Height 09/29/21 0516 '5\' 7"'$  (1.702 m)     Head Circumference --      Peak Flow --      Pain Score 09/29/21 0515 0     Pain Loc --      Pain Edu? --      Excl. in [place]? --     Most recent vital signs: Vitals:   09/29/21 0516 09/29/21 0635  BP: (!) 216/72 (!) 160/61  Pulse: 74 70  Resp: 16 15  Temp: 99.1 F (37.3 C)   SpO2: 99% 97%     Constitutional: Alert and oriented. Well appearing and in no apparent distress. HEENT:      Head: Normocephalic and atraumatic.  Eyes: Conjunctivae are normal. Sclera is non-icteric.       Mouth/Throat: Mucous membranes are moist.       Neck: Supple with no signs of meningismus. Cardiovascular: Regular rate and rhythm. No murmurs, gallops, or rubs. 2+ symmetrical distal pulses are present in all extremities.  Respiratory: Normal respiratory effort. Lungs are clear to auscultation bilaterally.  Gastrointestinal: Soft, non tender, and non distended with positive bowel sounds. No rebound or guarding. Genitourinary: No CVA tenderness. Musculoskeletal:  No edema, cyanosis, or erythema of extremities. Neurologic: Normal speech and language. Face is symmetric. Moving all extremities. No gross focal neurologic deficits are appreciated. Skin: Skin is warm, dry and intact. No rash noted. Psychiatric: Mood and affect are normal. Speech and behavior are normal.  ED Results / Procedures / Treatments    Labs (all labs ordered are listed, but only abnormal results are displayed) Labs Reviewed  COMPREHENSIVE METABOLIC PANEL - Abnormal; Notable for the following components:      Result Value   Glucose, Bld 133 (*)    BUN 27 (*)    GFR, Estimated 58 (*)    All other components within normal limits  URINALYSIS, ROUTINE W REFLEX MICROSCOPIC - Abnormal; Notable for the following components:   Color, Urine YELLOW (*)    APPearance HAZY (*)    Bacteria, UA RARE (*)    All other components within normal limits  CBC WITH DIFFERENTIAL/PLATELET - Abnormal; Notable for the following components:   Lymphs Abs 0.4 (*)    All other components within normal limits  LIPASE, BLOOD  TROPONIN I (HIGH SENSITIVITY)  TROPONIN I (HIGH SENSITIVITY)     EKG  ED ECG REPORT I, Rudene Re, the attending physician, personally viewed and interpreted this ECG.  Sinus rhythm with a rate of 71, normal intervals, normal axis, no ST elevations or depressions  RADIOLOGY none    PROCEDURES:  Critical Care performed: No  Procedures    IMPRESSION / MDM / ASSESSMENT AND PLAN / ED COURSE  I reviewed the triage vital signs and the nursing notes.   76 y.o. 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  She is hypertensive and denies any prior history of hypertension but asymptomatic from that perspective.  Abdomen soft and nontender, normal work of breathing and normal sats, lungs are clear to auscultation  Ddx: Side effect of Macrobid versus UTI versus urosepsis versus GERD versus SBO versus pancreatitis versus gastritis versus peptic ulcer disease versus viral syndrome versus food poisoning   Plan: We will give IV Zofran and IV fluids.  We will check CBC, CMP, lipase, urinalysis, EKG and troponin.  Patient placed on telemetry for monitoring of cardiorespiratory  status.   MEDICATIONS GIVEN IN ED: Medications  ondansetron (ZOFRAN) injection 4 mg (4 mg Intravenous Given 09/29/21 0637)  lactated ringers bolus 1,000 mL (1,000 mLs Intravenous New Bag/Given 09/29/21 2025)     ED COURSE: Labs showing no significant electrolyte derangements, AKI, no signs of sepsis with normal white count.  UA showing no signs of ear infection.  LFTs and lipase are within normal limits.  EKG and troponin are also within normal limits.  Plan for a second troponin and reassessment after IV fluids and Zofran.  If second troponin is negative and patient feels improved without any further episodes of vomiting and continues to endorse no abdominal discomfort will discharge home otherwise we will get  a CT abdomen pelvis.  Care transferred to Dr. Quentin Cornwall.   Consults: none  EMR reviewed Including records from last visit with her PCP from 2 days ago for UTI    FINAL CLINICAL IMPRESSION(S) / ED DIAGNOSES   Final diagnoses:  Nausea and vomiting, unspecified vomiting type     Rx / DC Orders   ED Discharge Orders     None        Note:  This document was prepared using Dragon voice recognition software and may include unintentional dictation errors.   Please note:  Patient was evaluated in Emergency Department today for the symptoms described in the history of present illness. Patient was evaluated in the context of the global COVID-19 pandemic, which necessitated consideration that the patient might be at risk for infection with the SARS-CoV-2 virus that causes COVID-19. Institutional protocols and algorithms that pertain to the evaluation of patients at risk for COVID-19 are in a state of rapid change based on information released by regulatory bodies including the CDC and federal and state organizations. These policies and algorithms were followed during the patient's care in the ED.  Some ED evaluations and interventions may be delayed as a result of limited staffing during  the pandemic.       Alfred Levins, [place], MD 09/29/21 [phone]

## 2021-09-29 NOTE — ED Triage Notes (Signed)
Pt presents to ER from home c/o nausea and 3-4 episodes of emesis that started suddenly tonight.  Pt denies recent sickness or sick contacts.  Denies abd pain or diarrhea.  Pt states she has recent started macrobid on Friday afternoon.  Pt is A&O x4 at this time in NAD.

## 2021-10-04 DIAGNOSIS — R112 Nausea with vomiting, unspecified: Secondary | ICD-10-CM | POA: Diagnosis not present

## 2021-10-04 DIAGNOSIS — T50905A Adverse effect of unspecified drugs, medicaments and biological substances, initial encounter: Secondary | ICD-10-CM | POA: Diagnosis not present

## 2021-10-04 DIAGNOSIS — N390 Urinary tract infection, site not specified: Secondary | ICD-10-CM | POA: Diagnosis not present

## 2021-10-08 DIAGNOSIS — M2041 Other hammer toe(s) (acquired), right foot: Secondary | ICD-10-CM | POA: Diagnosis not present

## 2021-10-08 DIAGNOSIS — M545 Low back pain, unspecified: Secondary | ICD-10-CM | POA: Diagnosis not present

## 2021-10-08 DIAGNOSIS — G8929 Other chronic pain: Secondary | ICD-10-CM | POA: Diagnosis not present

## 2021-10-08 DIAGNOSIS — M76822 Posterior tibial tendinitis, left leg: Secondary | ICD-10-CM | POA: Diagnosis not present

## 2021-10-08 DIAGNOSIS — M2141 Flat foot [pes planus] (acquired), right foot: Secondary | ICD-10-CM | POA: Diagnosis not present

## 2021-10-08 DIAGNOSIS — M2042 Other hammer toe(s) (acquired), left foot: Secondary | ICD-10-CM | POA: Diagnosis not present

## 2021-10-08 DIAGNOSIS — M216X2 Other acquired deformities of left foot: Secondary | ICD-10-CM | POA: Diagnosis not present

## 2021-10-08 DIAGNOSIS — M25572 Pain in left ankle and joints of left foot: Secondary | ICD-10-CM | POA: Diagnosis not present

## 2021-10-08 DIAGNOSIS — M2142 Flat foot [pes planus] (acquired), left foot: Secondary | ICD-10-CM | POA: Diagnosis not present

## 2021-10-08 DIAGNOSIS — M79672 Pain in left foot: Secondary | ICD-10-CM | POA: Diagnosis not present

## 2021-10-08 DIAGNOSIS — M216X1 Other acquired deformities of right foot: Secondary | ICD-10-CM | POA: Diagnosis not present

## 2021-10-22 DIAGNOSIS — I10 Essential (primary) hypertension: Secondary | ICD-10-CM | POA: Diagnosis not present

## 2021-10-22 DIAGNOSIS — N1831 Chronic kidney disease, stage 3a: Secondary | ICD-10-CM | POA: Diagnosis not present

## 2021-10-22 DIAGNOSIS — D6852 Prothrombin gene mutation: Secondary | ICD-10-CM | POA: Diagnosis not present

## 2021-10-22 DIAGNOSIS — Z0000 Encounter for general adult medical examination without abnormal findings: Secondary | ICD-10-CM | POA: Diagnosis not present

## 2021-10-22 DIAGNOSIS — E7800 Pure hypercholesterolemia, unspecified: Secondary | ICD-10-CM | POA: Diagnosis not present

## 2021-10-22 DIAGNOSIS — M129 Arthropathy, unspecified: Secondary | ICD-10-CM | POA: Diagnosis not present

## 2021-10-22 DIAGNOSIS — I2510 Atherosclerotic heart disease of native coronary artery without angina pectoris: Secondary | ICD-10-CM | POA: Diagnosis not present

## 2021-10-31 DIAGNOSIS — R739 Hyperglycemia, unspecified: Secondary | ICD-10-CM | POA: Diagnosis not present

## 2021-11-19 DIAGNOSIS — M79672 Pain in left foot: Secondary | ICD-10-CM | POA: Diagnosis not present

## 2021-11-19 DIAGNOSIS — M2142 Flat foot [pes planus] (acquired), left foot: Secondary | ICD-10-CM | POA: Diagnosis not present

## 2021-11-19 DIAGNOSIS — M25572 Pain in left ankle and joints of left foot: Secondary | ICD-10-CM | POA: Diagnosis not present

## 2021-11-19 DIAGNOSIS — M545 Low back pain, unspecified: Secondary | ICD-10-CM | POA: Diagnosis not present

## 2021-11-19 DIAGNOSIS — M2141 Flat foot [pes planus] (acquired), right foot: Secondary | ICD-10-CM | POA: Diagnosis not present

## 2021-11-19 DIAGNOSIS — M792 Neuralgia and neuritis, unspecified: Secondary | ICD-10-CM | POA: Diagnosis not present

## 2021-11-19 DIAGNOSIS — M216X1 Other acquired deformities of right foot: Secondary | ICD-10-CM | POA: Diagnosis not present

## 2021-11-19 DIAGNOSIS — M76822 Posterior tibial tendinitis, left leg: Secondary | ICD-10-CM | POA: Diagnosis not present

## 2021-11-19 DIAGNOSIS — M216X2 Other acquired deformities of left foot: Secondary | ICD-10-CM | POA: Diagnosis not present

## 2021-11-19 DIAGNOSIS — G8929 Other chronic pain: Secondary | ICD-10-CM | POA: Diagnosis not present

## 2021-11-19 DIAGNOSIS — M2041 Other hammer toe(s) (acquired), right foot: Secondary | ICD-10-CM | POA: Diagnosis not present

## 2021-11-19 DIAGNOSIS — M2042 Other hammer toe(s) (acquired), left foot: Secondary | ICD-10-CM | POA: Diagnosis not present

## 2021-11-25 DIAGNOSIS — M3501 Sicca syndrome with keratoconjunctivitis: Secondary | ICD-10-CM | POA: Diagnosis not present

## 2022-01-03 DIAGNOSIS — M1711 Unilateral primary osteoarthritis, right knee: Secondary | ICD-10-CM | POA: Diagnosis not present

## 2022-01-22 DIAGNOSIS — M1711 Unilateral primary osteoarthritis, right knee: Secondary | ICD-10-CM | POA: Diagnosis not present

## 2022-02-19 DIAGNOSIS — M7051 Other bursitis of knee, right knee: Secondary | ICD-10-CM | POA: Diagnosis not present

## 2022-02-19 DIAGNOSIS — M1711 Unilateral primary osteoarthritis, right knee: Secondary | ICD-10-CM | POA: Diagnosis not present

## 2022-03-03 DIAGNOSIS — Z1211 Encounter for screening for malignant neoplasm of colon: Secondary | ICD-10-CM | POA: Diagnosis not present

## 2022-03-06 DIAGNOSIS — H0011 Chalazion right upper eyelid: Secondary | ICD-10-CM | POA: Diagnosis not present

## 2022-03-12 ENCOUNTER — Other Ambulatory Visit: Payer: Self-pay | Admitting: Obstetrics and Gynecology

## 2022-03-12 DIAGNOSIS — Z01411 Encounter for gynecological examination (general) (routine) with abnormal findings: Secondary | ICD-10-CM | POA: Diagnosis not present

## 2022-03-12 DIAGNOSIS — Z1231 Encounter for screening mammogram for malignant neoplasm of breast: Secondary | ICD-10-CM

## 2022-03-12 DIAGNOSIS — N393 Stress incontinence (female) (male): Secondary | ICD-10-CM | POA: Diagnosis not present

## 2022-03-12 DIAGNOSIS — Z780 Asymptomatic menopausal state: Secondary | ICD-10-CM | POA: Diagnosis not present

## 2022-03-12 DIAGNOSIS — Z1331 Encounter for screening for depression: Secondary | ICD-10-CM | POA: Diagnosis not present

## 2022-03-14 ENCOUNTER — Telehealth: Payer: Self-pay | Admitting: *Deleted

## 2022-03-14 NOTE — Patient Outreach (Signed)
  Care Coordination   Initial Visit Note   03/14/2022 Name: April [name] MRN: [id number] DOB: 05/15/1945  April [name] is a 76 y.o. year old female who sees Feldpausch, [name], MD for primary care. I spoke with  Berniece Andreas by phone today.  What matters to the patients health and wellness today?  No health concerns voiced. RN discussed McDonald, RN, SW, and Pharmacist. Patient declined services.    Goals Addressed             This Visit's Progress    Advised patient to follow up on annual wellness visit and vaccines          SDOH assessments and interventions completed:  Yes    [place]: No Food Insecurity (03/14/2022)  Housing: Low Risk  (03/14/2022)  Transportation Needs: Unknown (03/14/2022)  Tobacco Use: Low Risk  (09/29/2021)     Care Coordination Interventions Activated:  Yes  Care Coordination Interventions:  Yes, provided  Care coordination program/ services discussed  Social determinants of health survey completed Annual wellness visit discussed and patient advised to contact provider office to schedule Patient advised to contact primary care provider office  if care coordination services needed in the future  Follow up plan: No further intervention required.   Encounter Outcome:  Pt. Visit Completed   [facility] [phone]

## 2022-03-28 DIAGNOSIS — M25561 Pain in right knee: Secondary | ICD-10-CM | POA: Diagnosis not present

## 2022-03-28 DIAGNOSIS — G8929 Other chronic pain: Secondary | ICD-10-CM | POA: Diagnosis not present

## 2022-03-28 DIAGNOSIS — M1711 Unilateral primary osteoarthritis, right knee: Secondary | ICD-10-CM | POA: Diagnosis not present

## 2022-03-30 ENCOUNTER — Emergency Department
Admission: EM | Admit: 2022-03-30 | Discharge: 2022-03-30 | Disposition: A | Discharge status: Home / Self Care | Payer: PPO | Attending: Emergency Medicine | Admitting: Emergency Medicine

## 2022-03-30 ENCOUNTER — Other Ambulatory Visit: Payer: Self-pay

## 2022-03-30 DIAGNOSIS — R8289 Other abnormal findings on cytological and histological examination of urine: Secondary | ICD-10-CM | POA: Insufficient documentation

## 2022-03-30 DIAGNOSIS — R112 Nausea with vomiting, unspecified: Secondary | ICD-10-CM

## 2022-03-30 DIAGNOSIS — R1111 Vomiting without nausea: Secondary | ICD-10-CM | POA: Diagnosis not present

## 2022-03-30 DIAGNOSIS — I2510 Atherosclerotic heart disease of native coronary artery without angina pectoris: Secondary | ICD-10-CM | POA: Insufficient documentation

## 2022-03-30 DIAGNOSIS — I10 Essential (primary) hypertension: Secondary | ICD-10-CM | POA: Diagnosis not present

## 2022-03-30 DIAGNOSIS — U071 COVID-19: Secondary | ICD-10-CM | POA: Diagnosis not present

## 2022-03-30 LAB — CBC WITH DIFFERENTIAL/PLATELET
Abs Immature Granulocytes: 0.03 10*3/uL (ref 0.00–0.07)
Basophils Absolute: 0 10*3/uL (ref 0.0–0.1)
Basophils Relative: 0 %
Eosinophils Absolute: 0 10*3/uL (ref 0.0–0.5)
Eosinophils Relative: 0 %
HCT: 37.5 % (ref 36.0–46.0)
Hemoglobin: 12.3 g/dL (ref 12.0–15.0)
Immature Granulocytes: 0 %
Lymphocytes Relative: 5 %
Lymphs Abs: 0.4 10*3/uL — ABNORMAL LOW (ref 0.7–4.0)
MCH: 30.5 pg (ref 26.0–34.0)
MCHC: 32.8 g/dL (ref 30.0–36.0)
MCV: 93.1 fL (ref 80.0–100.0)
Monocytes Absolute: 0.5 10*3/uL (ref 0.1–1.0)
Monocytes Relative: 6 %
Neutro Abs: 7.1 10*3/uL (ref 1.7–7.7)
Neutrophils Relative %: 89 %
Platelets: 250 10*3/uL (ref 150–400)
RBC: 4.03 MIL/uL (ref 3.87–5.11)
RDW: 13.7 % (ref 11.5–15.5)
WBC: 8.1 10*3/uL (ref 4.0–10.5)
nRBC: 0 % (ref 0.0–0.2)

## 2022-03-30 LAB — RESP PANEL BY RT-PCR (FLU A&B, COVID) ARPGX2
Influenza A by PCR: NEGATIVE
Influenza B by PCR: NEGATIVE
SARS Coronavirus 2 by RT PCR: POSITIVE — AB

## 2022-03-30 LAB — URINALYSIS, ROUTINE W REFLEX MICROSCOPIC
Bilirubin Urine: NEGATIVE
Glucose, UA: NEGATIVE mg/dL
Hgb urine dipstick: NEGATIVE
Ketones, ur: NEGATIVE mg/dL
Leukocytes,Ua: NEGATIVE
Nitrite: NEGATIVE
Protein, ur: 30 mg/dL — AB
Specific Gravity, Urine: 1.015 (ref 1.005–1.030)
pH: 7 (ref 5.0–8.0)

## 2022-03-30 LAB — COMPREHENSIVE METABOLIC PANEL
ALT: 22 U/L (ref 0–44)
AST: 22 U/L (ref 15–41)
Albumin: 3.8 g/dL (ref 3.5–5.0)
Alkaline Phosphatase: 74 U/L (ref 38–126)
Anion gap: 7 (ref 5–15)
BUN: 19 mg/dL (ref 8–23)
CO2: 24 mmol/L (ref 22–32)
Calcium: 9.3 mg/dL (ref 8.9–10.3)
Chloride: 106 mmol/L (ref 98–111)
Creatinine, Ser: 0.83 mg/dL (ref 0.44–1.00)
GFR, Estimated: >60 mL/min (ref >60)
Glucose, Bld: 141 mg/dL — ABNORMAL HIGH (ref 70–99)
Potassium: 3.7 mmol/L (ref 3.5–5.1)
Sodium: 137 mmol/L (ref 135–145)
Total Bilirubin: 0.9 mg/dL (ref 0.3–1.2)
Total Protein: 7.6 g/dL (ref 6.5–8.1)

## 2022-03-30 LAB — LIPASE, BLOOD: Lipase: 36 U/L (ref 11–51)

## 2022-03-30 MED ORDER — ONDANSETRON 4 MG PO TBDP: 4.0000 mg | ORAL_TABLET | Freq: Three times a day (TID) | ORAL | 0 refills | Status: DC | PRN

## 2022-03-30 MED ORDER — ACETAMINOPHEN 325 MG PO TABS
650.0000 mg | ORAL_TABLET | Freq: Once | ORAL | Status: AC
  Administered 2022-03-30: 650 mg via ORAL
  Filled 2022-03-30: qty 2

## 2022-03-30 MED ORDER — ONDANSETRON 4 MG PO TBDP
ORAL_TABLET | ORAL | Status: AC
  Filled 2022-03-30: qty 1

## 2022-03-30 MED ORDER — ONDANSETRON 4 MG PO TBDP
4.0000 mg | ORAL_TABLET | Freq: Once | ORAL | Status: AC
  Administered 2022-03-30: 4 mg via ORAL

## 2022-03-30 NOTE — Discharge Instructions (Addendum)
Continue taking the Paxlovid prescribed by your doctor.  Take the Zofran prescribed today as needed for nausea.  Follow-up with your regular doctor.  Return to the ER for new, worsening, or persistent severe nausea or vomiting, weakness, abdominal pain, high fever, difficulty breathing, or any other new or worsening symptoms that concern you.

## 2022-03-30 NOTE — ED Provider Notes (Signed)
[facility] Provider Note    Event Date/Time   First MD Initiated Contact with Patient 03/30/22 0500     (approximate)   History   Nausea   HPI  April [name] is a 76 y.o. 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  She denies any abdominal pain, diarrhea, or fever.  Her husband was diagnosed with COVID few days ago and the patient's symptoms started yesterday.  She has not yet been tested for COVID but her primary care doctor provisionally started her on Paxlovid.  The patient received Zofran at the beginning of her ED stay and is now feeling significantly better.  I reviewed the past medical records.  The patient's most recent ED visit was in June for nausea.  There is a note from Dr. Kary Kos from yesterday when the patient was started on Paxlovid.  Physical Exam   Triage Vital Signs: ED Triage Vitals  Enc Vitals Group     BP 03/30/22 0328 (!) 174/67     Pulse Rate 03/30/22 0328 77     Resp 03/30/22 0328 18     Temp 03/30/22 0328 99.2 F (37.3 C)     Temp Source 03/30/22 0328 Oral     SpO2 03/30/22 0328 98 %     Weight 03/30/22 0327 188 lb (85.3 kg)     Height 03/30/22 0327 '5\' 7"'$  (1.702 m)     Head Circumference --      Peak Flow --      Pain Score 03/30/22 0327 5     Pain Loc --      Pain Edu? --      Excl. in [place]? --     Most recent vital signs: Vitals:   03/30/22 0328 03/30/22 0525  BP: (!) 174/67 (!) 154/71  Pulse: 77 74  Resp: 18 16  Temp: 99.2 F (37.3 C) 98.9 F (37.2 C)  SpO2: 98% 99%     General: Awake, no distress.  CV:  Good peripheral perfusion.  Resp:  Normal effort.  Abd:  Soft and nontender.  No distention.  Other:  Moist mucous membranes.   ED Results / Procedures / Treatments   Labs (all labs ordered are listed, but only abnormal results are  displayed) Labs Reviewed  RESP PANEL BY RT-PCR (FLU A&B, COVID) ARPGX2 - Abnormal; Notable for the following components:      Result Value   SARS Coronavirus 2 by RT PCR POSITIVE (*)    All other components within normal limits  CBC WITH DIFFERENTIAL/PLATELET - Abnormal; Notable for the following components:   Lymphs Abs 0.4 (*)    All other components within normal limits  COMPREHENSIVE METABOLIC PANEL - Abnormal; Notable for the following components:   Glucose, Bld 141 (*)    All other components within normal limits  URINALYSIS, ROUTINE W REFLEX MICROSCOPIC - Abnormal; Notable for the following components:   Color, Urine YELLOW (*)    APPearance CLEAR (*)    Protein, ur 30 (*)    Bacteria, UA RARE (*)    All other components within normal limits  LIPASE, BLOOD     EKG     RADIOLOGY     PROCEDURES:  Critical Care performed: No  Procedures   MEDICATIONS ORDERED IN ED: Medications  acetaminophen (TYLENOL) tablet 650 mg (650 mg Oral  Given 03/30/22 0337)  ondansetron (ZOFRAN-ODT) disintegrating tablet 4 mg (4 mg Oral Given 03/30/22 0337)     IMPRESSION / MDM / ASSESSMENT AND PLAN / ED COURSE  I reviewed the triage vital signs and the nursing notes.  76 year old female with PMH as noted above presents with nausea and vomiting, primarily posttussive, in the context of likely COVID.  She received Zofran at the beginning of her ED visit and is feeling significantly better now would like to go home.  Physical exam is unremarkable.  The vital signs are normal and the abdomen is soft and nontender.  Differential diagnosis includes, but is not limited to, COVID-19, other viral syndrome, gastroenteritis, foodborne illness.  Lab workup is significant for positive COVID test.  Urinalysis shows some WBCs but no other findings to suggest UTI.  Electrolytes are normal.  There is no leukocytosis.  At this time the patient is asymptomatic and is stable for discharge home.  She is not  requiring any oxygen and does not need fluids.  I counseled her on the results of the workup.  I have prescribed Zofran for her to take as an outpatient if needed.  I gave strict return precautions and she expressed understanding.  Patient's presentation is most consistent with acute complicated illness / injury requiring diagnostic workup.     FINAL CLINICAL IMPRESSION(S) / ED DIAGNOSES   Final diagnoses:  COVID-19  Nausea and vomiting, unspecified vomiting type     Rx / DC Orders   ED Discharge Orders          Ordered    ondansetron (ZOFRAN-ODT) 4 MG disintegrating tablet  Every 8 hours PRN        03/30/22 0521             Note:  This document was prepared using Dragon voice recognition software and may include unintentional dictation errors.    Arta Silence, MD 03/30/22 [phone]

## 2022-03-30 NOTE — ED Triage Notes (Addendum)
Patient reports husband dx with COVID x 4 days. Reports onset of cough, body aches, nausea yesterday. Pt wasn't tested for COVID but pcp prescribed paxlovid. Pt reports unable to take paxlovid d/t persistent n/v. Pt reports no actual emesis but persistent dry heaving.  Pt alert and oriented in triage. Breathing unlabored speaking in full sentences.   EMS Vitals: 187/90 HR 87 RR 18 99% RA  98.7 oral

## 2022-04-01 ENCOUNTER — Other Ambulatory Visit: Payer: Self-pay | Admitting: Orthopedic Surgery

## 2022-04-01 DIAGNOSIS — G8929 Other chronic pain: Secondary | ICD-10-CM

## 2022-04-01 DIAGNOSIS — M1711 Unilateral primary osteoarthritis, right knee: Secondary | ICD-10-CM

## 2022-04-07 DIAGNOSIS — Z780 Asymptomatic menopausal state: Secondary | ICD-10-CM | POA: Diagnosis not present

## 2022-04-10 ENCOUNTER — Ambulatory Visit
Admission: RE | Admit: 2022-04-10 | Discharge: 2022-04-10 | Disposition: A | Discharge status: Home / Self Care | Payer: PPO | Source: Ambulatory Visit | Attending: Obstetrics and Gynecology | Admitting: Obstetrics and Gynecology

## 2022-04-10 DIAGNOSIS — Z1231 Encounter for screening mammogram for malignant neoplasm of breast: Secondary | ICD-10-CM | POA: Diagnosis not present

## 2022-04-11 ENCOUNTER — Ambulatory Visit
Admission: RE | Admit: 2022-04-11 | Discharge: 2022-04-11 | Disposition: A | Discharge status: Home / Self Care | Payer: PPO | Source: Ambulatory Visit | Attending: Orthopedic Surgery | Admitting: Orthopedic Surgery

## 2022-04-11 DIAGNOSIS — D2272 Melanocytic nevi of left lower limb, including hip: Secondary | ICD-10-CM | POA: Diagnosis not present

## 2022-04-11 DIAGNOSIS — L298 Other pruritus: Secondary | ICD-10-CM | POA: Diagnosis not present

## 2022-04-11 DIAGNOSIS — L820 Inflamed seborrheic keratosis: Secondary | ICD-10-CM | POA: Diagnosis not present

## 2022-04-11 DIAGNOSIS — M1711 Unilateral primary osteoarthritis, right knee: Secondary | ICD-10-CM

## 2022-04-11 DIAGNOSIS — L538 Other specified erythematous conditions: Secondary | ICD-10-CM | POA: Diagnosis not present

## 2022-04-11 DIAGNOSIS — G8929 Other chronic pain: Secondary | ICD-10-CM

## 2022-04-11 DIAGNOSIS — L570 Actinic keratosis: Secondary | ICD-10-CM | POA: Diagnosis not present

## 2022-04-11 DIAGNOSIS — M25561 Pain in right knee: Secondary | ICD-10-CM | POA: Diagnosis not present

## 2022-04-11 DIAGNOSIS — Z85828 Personal history of other malignant neoplasm of skin: Secondary | ICD-10-CM | POA: Diagnosis not present

## 2022-04-11 DIAGNOSIS — D225 Melanocytic nevi of trunk: Secondary | ICD-10-CM | POA: Diagnosis not present

## 2022-04-11 DIAGNOSIS — D2261 Melanocytic nevi of right upper limb, including shoulder: Secondary | ICD-10-CM | POA: Diagnosis not present

## 2022-04-14 ENCOUNTER — Other Ambulatory Visit: Payer: PPO

## 2022-04-18 DIAGNOSIS — Z86711 Personal history of pulmonary embolism: Secondary | ICD-10-CM | POA: Diagnosis not present

## 2022-04-18 DIAGNOSIS — M1711 Unilateral primary osteoarthritis, right knee: Secondary | ICD-10-CM | POA: Diagnosis not present

## 2022-05-02 DIAGNOSIS — D6852 Prothrombin gene mutation: Secondary | ICD-10-CM | POA: Diagnosis not present

## 2022-05-02 DIAGNOSIS — M129 Arthropathy, unspecified: Secondary | ICD-10-CM | POA: Diagnosis not present

## 2022-05-02 DIAGNOSIS — R7302 Impaired glucose tolerance (oral): Secondary | ICD-10-CM | POA: Diagnosis not present

## 2022-05-02 DIAGNOSIS — I10 Essential (primary) hypertension: Secondary | ICD-10-CM | POA: Diagnosis not present

## 2022-05-02 DIAGNOSIS — E7800 Pure hypercholesterolemia, unspecified: Secondary | ICD-10-CM | POA: Diagnosis not present

## 2022-05-02 DIAGNOSIS — N1831 Chronic kidney disease, stage 3a: Secondary | ICD-10-CM | POA: Diagnosis not present

## 2022-05-02 DIAGNOSIS — Z889 Allergy status to unspecified drugs, medicaments and biological substances status: Secondary | ICD-10-CM | POA: Diagnosis not present

## 2022-05-02 DIAGNOSIS — I2510 Atherosclerotic heart disease of native coronary artery without angina pectoris: Secondary | ICD-10-CM | POA: Diagnosis not present

## 2022-05-13 NOTE — Progress Notes (Signed)
MRN : [id number]  April [name] is a 77 y.o. (06-01-1945) female who presents with chief complaint of legs hurt and swell.  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  PE was identified in December 2016 by CT angiogram of the chest and was treated with anticoagulation.  Duplex ultrasound of bilateral lower extremities dated 04/15/2015 was negative for DVT.  The presenting symptoms were shortness of breath and pleuritic chest pain.  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  The patient has not been using compression therapy at this point.  No recent episodes of SOB or pleuritic chest pains.  No cough or hemoptysis.  No blood per rectum or blood in any sputum.  No excessive bruising per the patient.    No recent shortening of the patient's walking distance or new symptoms consistent with claudication.  No history of rest pain symptoms. No new ulcers or wounds of the lower extremities have occurred.  The patient denies amaurosis fugax or recent TIA symptoms. There are no recent neurological changes noted. No recent episodes of angina or shortness of breath documented.   Current Meds  Medication Sig   acetaminophen (TYLENOL) 325 MG tablet Take 500 mg by mouth every 6 (six) hours as needed.    aspirin EC 81 MG tablet Take 81 mg by mouth.   atenolol (TENORMIN) 25 MG tablet Take 1 tablet by mouth daily.   atorvastatin (LIPITOR) 40 MG tablet Take 40 mg by mouth daily.   calcium citrate-vitamin D (CITRACAL+D) 315-200 MG-UNIT tablet Take 1 tablet by mouth 2 (two) times daily.   Cholecalciferol (VITAMIN D3) 2000 UNITS capsule Take 1 capsule by mouth daily.   EPINEPHrine (EPIPEN 2-PAK) 0.3 mg/0.3 mL IJ SOAJ injection Inject 0.3 mLs (0.3 mg total) into the muscle as needed for  anaphylaxis.   Turmeric (QC TUMERIC COMPLEX PO) Take 1,000 mg by mouth daily at 2 PM.    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  [name], MD   Pulmonary embolism ([place]) 2016   Vertigo 1990   "inner ear"    Past Surgical History:  Procedure Laterality Date   ABDOMINAL HYSTERECTOMY     APPENDECTOMY     BREAST BIOPSY Right 04/30/2020   11:00 5cmfn Qmarker-benign, 11:00 8 cmfn twisted X marker-benign   CARDIAC CATHETERIZATION  2005   CATARACT EXTRACTION W/PHACO Left 02/08/2019   Procedure: CATARACT EXTRACTION PHACO AND INTRAOCULAR LENS PLACEMENT (IOC) LEFT 00:40.0  20.6%  8.26;  Surgeon: Birder Robson, MD;  Location: [place];  Service: Ophthalmology;  Laterality: Left;   CATARACT EXTRACTION W/PHACO Right 04/26/2019   Procedure: CATARACT EXTRACTION PHACO AND INTRAOCULAR LENS PLACEMENT (IOC) RIGHT 6.83 00:46.0;  Surgeon: Birder Robson, MD;  Location: [place];  Service: Ophthalmology;  Laterality: Right;   CHOLECYSTECTOMY     COLONOSCOPY  2017   FOOT SURGERY      Social History Social History   Tobacco  Use   Smoking status: Never   Smokeless tobacco: Never  Vaping Use   Vaping Use: Never used  Substance Use Topics   Alcohol use: No   Drug use: No    Family History Family History  Problem Relation Age of Onset   Stroke Mother    CAD Father    Colon cancer Other        paternal great grandfather   Breast cancer Neg Hx     Allergies  Allergen Reactions   Antihistamines, Chlorpheniramine-Type Other (See Comments)    "think crazy thoughts"   Ciprofloxacin Other (See Comments)    Patient cannot remember reaction   Dristan Cold  [Chlorphen-Pe-Acetaminophen] Other (See Comments)    "think crazy thoughts"   Ginger Diarrhea   Nitrofurantoin Nausea Only     REVIEW OF SYSTEMS (Negative unless checked)  Constitutional: '[]'$ Weight loss  '[]'$ Fever  '[]'$ Chills Cardiac: '[]'$ Chest pain   '[]'$ Chest pressure   '[]'$ Palpitations   '[]'$ Shortness of breath when laying flat   '[]'$ Shortness of breath with exertion. Vascular:  '[]'$ Pain in legs with walking   '[]'$ Pain in legs at rest  '[]'$ History of DVT   '[]'$ Phlebitis   '[]'$ Swelling in legs   '[]'$ Varicose veins   '[]'$ Non-healing ulcers Pulmonary:   '[]'$ Uses home oxygen   '[]'$ Productive cough   '[]'$ Hemoptysis   '[]'$ Wheeze  '[]'$ COPD   '[]'$ Asthma Neurologic:  '[]'$ Dizziness   '[]'$ Seizures   '[]'$ History of stroke   '[]'$ History of TIA  '[]'$ Aphasia   '[]'$ Vissual changes   '[]'$ Weakness or numbness in arm   '[]'$ Weakness or numbness in leg Musculoskeletal:   '[x]'$ Joint swelling   '[x]'$ Joint pain   '[]'$ Low back pain Hematologic:  '[]'$ Easy bruising  '[]'$ Easy bleeding   '[]'$ Hypercoagulable state   '[]'$ Anemic Gastrointestinal:  '[]'$ Diarrhea   '[]'$ Vomiting  '[]'$ Gastroesophageal reflux/heartburn   '[]'$ Difficulty swallowing. Genitourinary:  '[]'$ Chronic kidney disease   '[]'$ Difficult urination  '[]'$ Frequent urination   '[]'$ Blood in urine Skin:  '[]'$ Rashes   '[]'$ Ulcers  Psychological:  '[]'$ History of anxiety   '[]'$  History of major depression.  Physical Examination  Vitals:   05/15/22 0853  BP: (!) 144/68  Pulse: 63  Weight: 191 lb (86.6 kg)  Height: '5\' 7"'$  (1.702 m)   Body mass index is 29.91 kg/m. Gen: WD/WN, NAD Head: [place]/AT, No temporalis wasting.  Ear/Nose/Throat: Hearing grossly intact, nares w/o erythema or drainage, pinna without lesions Eyes: PER, EOMI, sclera nonicteric.  Neck: Supple, no gross masses.  No JVD.  Pulmonary:  Good air movement, no audible wheezing, no use of accessory muscles.  Cardiac: RRR, precordium not hyperdynamic. Vascular:  scattered varicosities present bilaterally.  Moderate venous stasis changes to the legs bilaterally.  No significant edema Vessel  Right Left  Radial Palpable Palpable  Gastrointestinal: soft, non-distended. No guarding/no peritoneal signs.  Musculoskeletal: M/S 5/5 throughout.  No deformity.  Neurologic: CN 2-12 intact. Pain and light touch intact in extremities.  Symmetrical.  Speech is fluent. Motor exam as listed above. Psychiatric: Judgment intact, Mood & affect appropriate for pt's clinical situation. Dermatologic: No changes consistent with cellulitis. Lymph : No lichenification or skin changes of chronic lymphedema.  CBC Lab Results  Component Value Date   WBC 8.1 03/30/2022   HGB 12.3 03/30/2022   HCT 37.5 03/30/2022   MCV 93.1 03/30/2022   PLT 250 03/30/2022    BMET    Component Value Date/Time   NA 137 03/30/2022 0344   K 3.7 03/30/2022 0344   CL 106 03/30/2022 0344   CO2 24 03/30/2022 0344  GLUCOSE 141 (H) 03/30/2022 0344   BUN 19 03/30/2022 0344   CREATININE 0.83 03/30/2022 0344   CALCIUM 9.3 03/30/2022 0344   GFRNONAA >60 03/30/2022 0344   GFRAA >60 04/16/2015 0532   CrCl cannot be calculated (Patient's most recent lab result is older than the maximum 21 days allowed.).  COAG No results found for: "INR", "PROTIME"  Radiology No results found.   Assessment/Plan 1. Other pulmonary embolism without acute cor pulmonale, unspecified chronicity (HCC) IVC filter is strongly indicated prior to high risk orthopedic surgery.  Especially given the history of PE / DVT.  IVC filter placement will be done the within 1 to 2 weeks before surgery and we will work with orthopedic surgeon for coordination.  Risk and benefits were reviewed the patient.  Indications for the procedure were reviewed.  All questions were answered, the patient agrees to proceed.     The patient will follow-up with me 2-3 months after the joint replacement surgery to discuss removal (this was also discussed today and the patient agrees with the plan to have the filter removed).   2. Mixed hyperlipidemia Continue statin  as ordered and reviewed, no changes at this time    Kris Hartmann, NP  05/15/2022 9:22 AM

## 2022-05-15 ENCOUNTER — Encounter (INDEPENDENT_AMBULATORY_CARE_PROVIDER_SITE_OTHER): Payer: Self-pay | Admitting: Nurse Practitioner

## 2022-05-15 ENCOUNTER — Ambulatory Visit (INDEPENDENT_AMBULATORY_CARE_PROVIDER_SITE_OTHER): Payer: PPO | Admitting: Nurse Practitioner

## 2022-05-15 VITALS — BP 144/68 | HR 63 | Ht 67.0 in | Wt 191.0 lb

## 2022-05-15 DIAGNOSIS — E782 Mixed hyperlipidemia: Secondary | ICD-10-CM | POA: Diagnosis not present

## 2022-05-15 DIAGNOSIS — I2699 Other pulmonary embolism without acute cor pulmonale: Secondary | ICD-10-CM

## 2022-05-15 DIAGNOSIS — D6852 Prothrombin gene mutation: Secondary | ICD-10-CM

## 2022-05-16 DIAGNOSIS — N1831 Chronic kidney disease, stage 3a: Secondary | ICD-10-CM | POA: Diagnosis not present

## 2022-05-16 DIAGNOSIS — R7302 Impaired glucose tolerance (oral): Secondary | ICD-10-CM | POA: Diagnosis not present

## 2022-05-16 DIAGNOSIS — M129 Arthropathy, unspecified: Secondary | ICD-10-CM | POA: Diagnosis not present

## 2022-05-16 DIAGNOSIS — I10 Essential (primary) hypertension: Secondary | ICD-10-CM | POA: Diagnosis not present

## 2022-05-16 DIAGNOSIS — E7800 Pure hypercholesterolemia, unspecified: Secondary | ICD-10-CM | POA: Diagnosis not present

## 2022-05-16 DIAGNOSIS — I2510 Atherosclerotic heart disease of native coronary artery without angina pectoris: Secondary | ICD-10-CM | POA: Diagnosis not present

## 2022-05-27 DIAGNOSIS — I10 Essential (primary) hypertension: Secondary | ICD-10-CM | POA: Diagnosis not present

## 2022-05-27 DIAGNOSIS — I2699 Other pulmonary embolism without acute cor pulmonale: Secondary | ICD-10-CM | POA: Diagnosis not present

## 2022-05-27 DIAGNOSIS — R9431 Abnormal electrocardiogram [ECG] [EKG]: Secondary | ICD-10-CM | POA: Diagnosis not present

## 2022-05-27 DIAGNOSIS — I2510 Atherosclerotic heart disease of native coronary artery without angina pectoris: Secondary | ICD-10-CM | POA: Diagnosis not present

## 2022-05-27 DIAGNOSIS — E785 Hyperlipidemia, unspecified: Secondary | ICD-10-CM | POA: Diagnosis not present

## 2022-05-27 DIAGNOSIS — Z7982 Long term (current) use of aspirin: Secondary | ICD-10-CM | POA: Diagnosis not present

## 2022-06-02 ENCOUNTER — Telehealth (INDEPENDENT_AMBULATORY_CARE_PROVIDER_SITE_OTHER): Payer: Self-pay

## 2022-06-02 NOTE — Telephone Encounter (Signed)
Spoke with the patient and she is scheduled with Dr. Delana Meyer on 07/08/22 with a 6:45 [facility]. Pre-procedure instructions were discussed and will be mailed.

## 2022-06-03 DIAGNOSIS — I2510 Atherosclerotic heart disease of native coronary artery without angina pectoris: Secondary | ICD-10-CM | POA: Diagnosis not present

## 2022-06-03 DIAGNOSIS — I10 Essential (primary) hypertension: Secondary | ICD-10-CM | POA: Diagnosis not present

## 2022-07-01 ENCOUNTER — Other Ambulatory Visit: Payer: Self-pay | Admitting: Orthopedic Surgery

## 2022-07-08 ENCOUNTER — Other Ambulatory Visit: Payer: Self-pay

## 2022-07-08 ENCOUNTER — Encounter
Admission: RE | Disposition: A | Discharge status: Home / Self Care | Payer: Self-pay | Source: Home / Self Care | Attending: Vascular Surgery

## 2022-07-08 ENCOUNTER — Ambulatory Visit
Admission: RE | Admit: 2022-07-08 | Discharge: 2022-07-08 | Disposition: A | Discharge status: Home / Self Care | Payer: HMO | Attending: Vascular Surgery | Admitting: Vascular Surgery

## 2022-07-08 ENCOUNTER — Encounter: Payer: Self-pay | Admitting: Vascular Surgery

## 2022-07-08 DIAGNOSIS — Z86718 Personal history of other venous thrombosis and embolism: Secondary | ICD-10-CM | POA: Diagnosis not present

## 2022-07-08 DIAGNOSIS — E782 Mixed hyperlipidemia: Secondary | ICD-10-CM | POA: Insufficient documentation

## 2022-07-08 DIAGNOSIS — Z408 Encounter for other prophylactic surgery: Secondary | ICD-10-CM

## 2022-07-08 DIAGNOSIS — Z409 Encounter for prophylactic surgery, unspecified: Secondary | ICD-10-CM | POA: Insufficient documentation

## 2022-07-08 DIAGNOSIS — Z86711 Personal history of pulmonary embolism: Secondary | ICD-10-CM | POA: Diagnosis not present

## 2022-07-08 DIAGNOSIS — I82409 Acute embolism and thrombosis of unspecified deep veins of unspecified lower extremity: Secondary | ICD-10-CM

## 2022-07-08 HISTORY — PX: IVC FILTER INSERTION: CATH118245

## 2022-07-08 SURGERY — IVC FILTER INSERTION
Anesthesia: Moderate Sedation

## 2022-07-08 MED ORDER — ONDANSETRON HCL 4 MG/2ML IJ SOLN: 4.0000 mg | Freq: Four times a day (QID) | INTRAMUSCULAR | Status: DC | PRN

## 2022-07-08 MED ORDER — HYDROMORPHONE HCL 1 MG/ML IJ SOLN: 1.0000 mg | Freq: Once | INTRAMUSCULAR | Status: DC | PRN

## 2022-07-08 MED ORDER — FAMOTIDINE 20 MG PO TABS: 40.0000 mg | ORAL_TABLET | Freq: Once | ORAL | Status: DC | PRN

## 2022-07-08 MED ORDER — MIDAZOLAM HCL 2 MG/ML PO SYRP: 8.0000 mg | ORAL_SOLUTION | Freq: Once | ORAL | Status: DC | PRN

## 2022-07-08 MED ORDER — SODIUM CHLORIDE 0.9 % IV SOLN: INTRAVENOUS | Status: DC

## 2022-07-08 MED ORDER — MIDAZOLAM HCL 2 MG/2ML IJ SOLN
INTRAMUSCULAR | Status: DC | PRN
  Administered 2022-07-08: 2 mg via INTRAVENOUS

## 2022-07-08 MED ORDER — CEFAZOLIN SODIUM-DEXTROSE 2-4 GM/100ML-% IV SOLN: 2.0000 g | INTRAVENOUS | Status: AC

## 2022-07-08 MED ORDER — CEFAZOLIN SODIUM-DEXTROSE 2-4 GM/100ML-% IV SOLN
INTRAVENOUS | Status: AC
  Administered 2022-07-08: 2 g via INTRAVENOUS
  Filled 2022-07-08: qty 100

## 2022-07-08 MED ORDER — FENTANYL CITRATE (PF) 100 MCG/2ML IJ SOLN
INTRAMUSCULAR | Status: DC | PRN
  Administered 2022-07-08: 50 ug via INTRAVENOUS

## 2022-07-08 MED ORDER — DIPHENHYDRAMINE HCL 50 MG/ML IJ SOLN: 50.0000 mg | Freq: Once | INTRAMUSCULAR | Status: DC | PRN

## 2022-07-08 MED ORDER — IODIXANOL 320 MG/ML IV SOLN
INTRAVENOUS | Status: DC | PRN
  Administered 2022-07-08: 15 mL via INTRAVENOUS

## 2022-07-08 MED ORDER — METHYLPREDNISOLONE SODIUM SUCC 125 MG IJ SOLR: 125.0000 mg | Freq: Once | INTRAMUSCULAR | Status: DC | PRN

## 2022-07-08 MED ORDER — MIDAZOLAM HCL 2 MG/2ML IJ SOLN
INTRAMUSCULAR | Status: AC
  Filled 2022-07-08: qty 2

## 2022-07-08 MED ORDER — FENTANYL CITRATE PF 50 MCG/ML IJ SOSY
PREFILLED_SYRINGE | INTRAMUSCULAR | Status: AC
  Filled 2022-07-08: qty 1

## 2022-07-08 SURGICAL SUPPLY — 7 items
COVER PROBE ULTRASOUND 5X96 (MISCELLANEOUS) IMPLANT
KIT FEM OPTION ELITE FILTER (Filter) IMPLANT
NDL ENTRY 21GA 7CM ECHOTIP (NEEDLE) IMPLANT
NEEDLE ENTRY 21GA 7CM ECHOTIP (NEEDLE) ×1 IMPLANT
PACK ANGIOGRAPHY (CUSTOM PROCEDURE TRAY) ×1 IMPLANT
SET INTRO CAPELLA COAXIAL (SET/KITS/TRAYS/PACK) IMPLANT
WIRE SUPRACORE 190CM (WIRE) IMPLANT

## 2022-07-08 NOTE — Discharge Instructions (Signed)
Groin Insertion Instructions-If you lose feeling or develop tingling or pain in your leg or foot after the procedure, please walk around first.  If the discomfort does not improve , contact your physician and proceed to the nearest emergency room.  Loss of feeling in your leg might mean that a blockage has formed in the artery and this can be appropriately treated.  Limit your activity for the next two days after your procedure.  Avoid stooping, bending, heavy lifting or exertion as this may put pressure on the insertion site.  Resume normal activities in 48 hours.  You may shower after 24 hours but avoid excessive warm water and do not scrub the site.  Remove clear dressing in 48 hours.  If you have had a closure device inserted, do not soak in a tub bath or a hot tub for at least one week.  No driving for 48 hours after discharge.  After the procedure, check the insertion site occasionally.  If any oozing occurs or there is apparent swelling, firm pressure over the site will prevent a bruise from forming.  You can not hurt anything by pressing directly on the site.  The pressure stops the bleeding by allowing a small clot to form.  If the bleeding continues after the pressure has been applied for more than 15 minutes, call 911 or go to the nearest emergency room.    The x-ray dye causes you to pass a considerate amount of urine.  For this reason, you will be asked to drink plenty of liquids after the procedure to prevent dehydration.  You may resume you regular diet.  Avoid caffeine products.    For pain at the site of your procedure, take non-aspirin medicines such as Tylenol.  Medications: A. Hold Metformin for 48 hours if applicable.  B. Continue taking all your present medications at home unless your doctor prescribes any changes.   Inferior Vena Cava Filter Insertion, Care After The following information offers guidance on how to care for yourself after your procedure. Your health care provider  may also give you more specific instructions. If you have problems or questions, contact your health care provider. What can I expect after the procedure? After the procedure, it is common to have: Mild pain and bruising around the area where the long, thin tube (catheter) was inserted in your neck or groin to deliver the filter. Tiredness (fatigue). Follow these instructions at home: Insertion site care Follow instructions from your health care provider about how to take care of your catheter insertion site. Make sure you: Wash your hands with soap and water for at least 20 seconds before and after you change your bandage (dressing). If soap and water are not available, use hand sanitizer. Remove your dressing in 48 hours You may shower tomorrow Keep the dressing and the insertion site clean and dry. Check your insertion site every day for signs of infection. Check for: More redness, swelling, or pain. Fluid or blood. Warmth. Pus or a bad smell. Do not take baths, swim, or use a hot tub for 7 days after your procedure.  Activity: Avoid strenuous exercise or activities that take a lot of effort for 1 week. Do not go back to school or work for 3 days. Avoid any heavy lifting for 1 week.  General instructions If you were given a sedative during the procedure, it can affect you for several hours. Do not drive or operate machinery for 24 hours Take over-the-counter and prescription medicines  only as told by your health care provider. Wear compression stockings as told by your health care provider. These stockings help to prevent blood clots and reduce swelling in your legs. Keep all follow-up visits. This is important. Contact a health care provider if: You have any of these signs of infection: More redness, swelling, or pain around your catheter insertion site. Fluid or blood coming from your insertion site. Warmth coming from your insertion site. Pus or a bad smell coming from your  insertion site. A fever. You are dizzy. You have nausea and vomiting. You develop a rash. Get help right away if: You have blood coming from your catheter insertion site (active bleeding). If you have bleeding from the insertion site, lie down, apply pressure to the area with a clean cloth or gauze, and get help right away. You have chest pain, a cough, or difficulty breathing. You have shortness of breath, feel faint, or pass out. You cough up blood. You have severe pain in your abdomen. You develop swelling and discoloration or pain in your legs. Your legs become pale and cold or blue. You have weakness, difficulty moving your arms or legs, or balance problems. You develop problems with speech or vision. These symptoms may be an emergency. Get help right away. Call 911. Do not wait to see if the symptoms will go away. Do not drive yourself to the hospital. Summary Follow instructions from your health care provider about how to take care of your catheter insertion site. After the procedure, it is common to have mild pain and bruising where a catheter was inserted at your neck or groin. Every day, check for signs of infection at the catheter insertion site. Get help right away if you have active bleeding, chest pain, or trouble breathing. This information is not intended to replace advice given to you by your health care provider. Make sure you discuss any questions you have with your health care provider. Document Revised: 04/30/2021 Document Reviewed: 04/13/2019 Elsevier Patient Education  [place].

## 2022-07-08 NOTE — Interval H&P Note (Signed)
History and Physical Interval Note:  07/08/2022 8:22 AM  April [name]  has presented today for surgery, with the diagnosis of IVC Filter Placement   DVT.  The various methods of treatment have been discussed with the patient and family. After consideration of risks, benefits and other options for treatment, the patient has consented to  Procedure(s): IVC FILTER INSERTION (N/A) as a surgical intervention.  The patient's history has been reviewed, patient examined, no change in status, stable for surgery.  I have reviewed the patient's chart and labs.  Questions were answered to the patient's satisfaction.     [name]

## 2022-07-08 NOTE — Op Note (Signed)
[facility]   OPERATIVE NOTE    PRE-OPERATIVE DIAGNOSIS: History of DVT with PE; upcoming joint replacement surgery  POST-OPERATIVE DIAGNOSIS: Same  PROCEDURE: 1.   Ultrasound guidance for vascular access to the right common vein 2.   Catheter placement into the inferior vena cava 3.   Inferior venacavogram 4.   Placement of a option Elite IVC filter  SURGEON: [name]  ASSISTANT(S): None  ANESTHESIA: Conscious sedation was administered by the interventional radiology RN under my direct supervision. IV Versed plus fentanyl were utilized. Continuous ECG, pulse oximetry and blood pressure was monitored throughout the entire procedure. Conscious sedation was for a total of 18 minutes.  ESTIMATED BLOOD LOSS: minimal  FINDING(S): 1.  Patent IVC  SPECIMEN(S):  none  INDICATIONS:   April [name] is a 77 y.o. y.o. female who presents with upcoming joint replacement surgery.  She has a history of DVT and PE.  Inferior vena cava filter is indicated for this reason.  Risks and benefits including filter thrombosis, migration, fracture, bleeding, and infection were all discussed.  We discussed that all IVC filters that we place can be removed if desired from the patient once the need for the filter has passed.    DESCRIPTION: After obtaining full informed written consent, the patient was brought back to the vascular suite. The skin was sterilely prepped and draped in a sterile surgical field was created. Ultrasound was placed in a sterile sleeve. The right common femoral vein was echolucent and compressible indicating patency. Image was recorded for the permanent record. The puncture was made under continuous real-time ultrasound guidance.  The right common femoral vein was accessed under direct ultrasound guidance without difficulty with a micropuncture needle. Microwire was then advanced under fluoroscopic guidance without difficulty. Micro-sheath was then  inserted and a J-wire was then placed. The dilator is passed over the wire and the delivery sheath was placed into the inferior vena cava.  Inferior venacavogram was performed. This demonstrated a patent IVC with the level of the renal veins at L1.  The filter was then deployed into the inferior vena cava at the level of L2.  Just below the renal veins. The delivery sheath was then removed. Pressure was held. Sterile dressings were placed. The patient tolerated the procedure well and was taken to the recovery room in stable condition.  Interpretation: IVC is patent.  It measures approximately 18 mm in diameter.  Option Elite filter is deployed at L2 and upright position  COMPLICATIONS: None  CONDITION: Stable  [name]  07/08/2022, 9:09 AM

## 2022-07-08 NOTE — Progress Notes (Signed)
MRN : [id number]  April [name] is a 77 y.o. (Oct 23, 1945) female who presents with chief complaint of legs hurt and swell.  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  PE was identified in December 2016 by CT angiogram of the chest and was treated with anticoagulation.  Duplex ultrasound of bilateral lower extremities dated 04/15/2015 was negative for DVT.  The presenting symptoms were shortness of breath and pleuritic chest pain.   Recent orthopedic evaluation at [facility] clinic demonstrates her right knee is bone-on-bone and right total knee replacement has been recommended.   The patient has not been using compression therapy at this point.   No recent episodes of SOB or pleuritic chest pains.  No cough or hemoptysis.   No blood per rectum or blood in any sputum.  No excessive bruising per the patient.    No recent shortening of the patient's walking distance or new symptoms consistent with claudication.  No history of rest pain symptoms. No new ulcers or wounds of the lower extremities have occurred.   The patient denies amaurosis fugax or recent TIA symptoms. There are no recent neurological changes noted. No recent episodes of angina or shortness of breath documented.     Current Meds  Medication Sig   acetaminophen (TYLENOL) 325 MG tablet Take 500 mg by mouth every 6 (six) hours as needed.    aspirin EC 81 MG tablet Take 81 mg by mouth.   atenolol (TENORMIN) 25 MG tablet Take 1 tablet by mouth daily.   atorvastatin (LIPITOR) 40 MG tablet Take 40 mg by mouth daily.   calcium citrate-vitamin D (CITRACAL+D) 315-200 MG-UNIT tablet Take 1 tablet by mouth 2 (two) times daily.   Cholecalciferol (VITAMIN D3) 2000 UNITS capsule Take 1 capsule by mouth daily.   EPINEPHrine (EPIPEN 2-PAK) 0.3 mg/0.3 mL IJ SOAJ injection Inject 0.3 mLs (0.3 mg  total) into the muscle as needed for anaphylaxis.   Turmeric (QC TUMERIC COMPLEX PO) Take 1,000 mg by mouth daily at 2 PM.    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  [name], MD   Pulmonary embolism ([place]) 2016   Vertigo 1990   "inner ear"    Past Surgical History:  Procedure Laterality Date   ABDOMINAL HYSTERECTOMY     APPENDECTOMY     BREAST BIOPSY Right 04/30/2020   11:00 5cmfn Qmarker-benign, 11:00 8 cmfn twisted X marker-benign   CARDIAC CATHETERIZATION  2005   CATARACT EXTRACTION W/PHACO Left 02/08/2019   Procedure: CATARACT EXTRACTION PHACO AND INTRAOCULAR LENS PLACEMENT (IOC) LEFT 00:40.0  20.6%  8.26;  Surgeon: Birder Robson, MD;  Location: [place];  Service: Ophthalmology;  Laterality: Left;   CATARACT EXTRACTION W/PHACO Right 04/26/2019   Procedure: CATARACT EXTRACTION PHACO AND INTRAOCULAR LENS PLACEMENT (IOC) RIGHT 6.83 00:46.0;  Surgeon: Birder Robson, MD;  Location: [place];  Service: Ophthalmology;  Laterality: Right;   CHOLECYSTECTOMY     COLONOSCOPY  2017   FOOT SURGERY      Social History Social History   Tobacco Use   Smoking status: Never   Smokeless tobacco: Never  Vaping Use   Vaping Use: Never used  Substance Use Topics   Alcohol use: No   Drug use: No    Family History Family History  Problem Relation Age of Onset   Stroke Mother    CAD Father    Colon cancer Other        paternal great grandfather   Breast cancer Neg Hx     Allergies  Allergen Reactions   Antihistamines, Chlorpheniramine-Type Other (See Comments)    "think crazy thoughts"   Ciprofloxacin Other (See Comments)    Patient cannot remember reaction    Dristan Cold [Chlorphen-Pe-Acetaminophen] Other (See Comments)    "think crazy thoughts"   Ginger Diarrhea   Nitrofurantoin Nausea Only     REVIEW OF SYSTEMS (Negative unless checked)  Constitutional: '[]'$ Weight loss  '[]'$ Fever  '[]'$ Chills Cardiac: '[]'$ Chest pain   '[]'$ Chest pressure   '[]'$ Palpitations   '[]'$ Shortness of breath when laying flat   '[]'$ Shortness of breath with exertion. Vascular:  '[]'$ Pain in legs with walking   '[x]'$ Pain in legs at rest  '[]'$ History of DVT   '[]'$ Phlebitis   '[x]'$ Swelling in legs   '[]'$ Varicose veins   '[]'$ Non-healing ulcers Pulmonary:   '[]'$ Uses home oxygen   '[]'$ Productive cough   '[]'$ Hemoptysis   '[]'$ Wheeze  '[]'$ COPD   '[]'$ Asthma Neurologic:  '[]'$ Dizziness   '[]'$ Seizures   '[]'$ History of stroke   '[]'$ History of TIA  '[]'$ Aphasia   '[]'$ Vissual changes   '[]'$ Weakness or numbness in arm   '[]'$ Weakness or numbness in leg Musculoskeletal:   '[x]'$ Joint swelling   '[x]'$ Joint pain   '[]'$ Low back pain Hematologic:  '[]'$ Easy bruising  '[]'$ Easy bleeding   '[]'$ Hypercoagulable state   '[]'$ Anemic Gastrointestinal:  '[]'$ Diarrhea   '[]'$ Vomiting  '[]'$ Gastroesophageal reflux/heartburn   '[]'$ Difficulty swallowing. Genitourinary:  '[]'$ Chronic kidney disease   '[]'$ Difficult urination  '[]'$ Frequent urination   '[]'$ Blood in urine Skin:  '[]'$ Rashes   '[]'$ Ulcers  Psychological:  '[]'$ History of anxiety   '[]'$  History of major depression.  Physical Examination  Vitals:   07/08/22 0732  BP: (!) 179/74  Pulse: (!) 49  Resp: 20  Temp: 97.9 F (36.6 C)  TempSrc: Oral  SpO2: 98%  Weight: 85.3 kg  Height: 5' 7.01" (1.702 m)   Body mass index is 29.44 kg/m. Gen: WD/WN, NAD Head: [place]/AT, No temporalis wasting.  Ear/Nose/Throat: Hearing grossly intact, nares w/o erythema or drainage, pinna without lesions Eyes: PER, EOMI, sclera nonicteric.  Neck: Supple, no gross masses.  No JVD.  Pulmonary:  Good air movement, no audible wheezing, no use of accessory muscles.  Cardiac: RRR, precordium not hyperdynamic. Vascular:  scattered varicosities present bilaterally.  Moderate  venous stasis changes to the legs bilaterally.  2+ soft pitting edema. CEAP C4sEpAsPr   Vessel Right Left  Radial Palpable Palpable  Gastrointestinal: soft, non-distended. No guarding/no peritoneal signs.  Musculoskeletal: M/S 5/5 throughout.  No deformity.  Neurologic: CN 2-12 intact. Pain and light touch intact in extremities.  Symmetrical.  Speech is fluent. Motor exam as listed above. Psychiatric: Judgment intact, Mood & affect appropriate for pt's clinical situation. Dermatologic: Venous rashes no ulcers noted.  No changes consistent with cellulitis. Lymph : No lichenification or skin changes of chronic lymphedema.  CBC Lab Results  Component Value Date   WBC 8.1 03/30/2022   HGB 12.3 03/30/2022   HCT 37.5 03/30/2022   MCV 93.1  03/30/2022   PLT 250 03/30/2022    BMET    Component Value Date/Time   NA 137 03/30/2022 0344   K 3.7 03/30/2022 0344   CL 106 03/30/2022 0344   CO2 24 03/30/2022 0344   GLUCOSE 141 (H) 03/30/2022 0344   BUN 19 03/30/2022 0344   CREATININE 0.83 03/30/2022 0344   CALCIUM 9.3 03/30/2022 0344   GFRNONAA >60 03/30/2022 0344   GFRAA >60 04/16/2015 0532   CrCl cannot be calculated (Patient's most recent lab result is older than the maximum 21 days allowed.).  COAG No results found for: "INR", "PROTIME"  Radiology No results found.   Assessment/Plan 1. Other pulmonary embolism without acute cor pulmonale, unspecified chronicity (HCC) IVC filter is strongly indicated prior to high risk orthopedic surgery.  Especially given the history of PE / DVT.   IVC filter placement will be done the within 1 to 2 weeks before surgery and we will work with orthopedic surgeon for coordination.  Risk and benefits were reviewed the patient.  Indications for the procedure were reviewed.  All questions were answered, the patient agrees to proceed.        The patient will follow-up with me 2-3 months after the joint replacement surgery to discuss removal (this was  also discussed today and the patient agrees with the plan to have the filter removed).    2. Mixed hyperlipidemia Continue statin as ordered and reviewed, no changes at this time       [name], MD  07/08/2022 8:20 AM

## 2022-07-08 NOTE — H&P (View-Only) (Signed)
MRN : [id number]  April [name] is a 77 y.o. ([birth year]) female who presents with chief complaint of legs hurt and swell.  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  PE was identified in December 2016 by CT angiogram of the chest and was treated with anticoagulation.  Duplex ultrasound of bilateral lower extremities dated 04/15/2015 was negative for DVT.  The presenting symptoms were shortness of breath and pleuritic chest pain.   Recent orthopedic evaluation at [facility] clinic demonstrates her right knee is bone-on-bone and right total knee replacement has been recommended.   The patient has not been using compression therapy at this point.   No recent episodes of SOB or pleuritic chest pains.  No cough or hemoptysis.   No blood per rectum or blood in any sputum.  No excessive bruising per the patient.    No recent shortening of the patient's walking distance or new symptoms consistent with claudication.  No history of rest pain symptoms. No new ulcers or wounds of the lower extremities have occurred.   The patient denies amaurosis fugax or recent TIA symptoms. There are no recent neurological changes noted. No recent episodes of angina or shortness of breath documented.     Current Meds  Medication Sig   acetaminophen (TYLENOL) 325 MG tablet Take 500 mg by mouth every 6 (six) hours as needed.    aspirin EC 81 MG tablet Take 81 mg by mouth.   atenolol (TENORMIN) 25 MG tablet Take 1 tablet by mouth daily.   atorvastatin (LIPITOR) 40 MG tablet Take 40 mg by mouth daily.   calcium citrate-vitamin D (CITRACAL+D) 315-200 MG-UNIT tablet Take 1 tablet by mouth 2 (two) times daily.   Cholecalciferol (VITAMIN D3) 2000 UNITS capsule Take 1 capsule by mouth daily.   EPINEPHrine (EPIPEN 2-PAK) 0.3 mg/0.3 mL IJ SOAJ injection Inject 0.3 mLs (0.3 mg  total) into the muscle as needed for anaphylaxis.   Turmeric (QC TUMERIC COMPLEX PO) Take 1,000 mg by mouth daily at 2 PM.    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  [name], MD   Pulmonary embolism ([place]) 2016   Vertigo 1990   "inner ear"    Past Surgical History:  Procedure Laterality Date   ABDOMINAL HYSTERECTOMY     APPENDECTOMY     BREAST BIOPSY Right 04/30/2020   11:00 5cmfn Qmarker-benign, 11:00 8 cmfn twisted X marker-benign   CARDIAC CATHETERIZATION  2005   CATARACT EXTRACTION W/PHACO Left 02/08/2019   Procedure: CATARACT EXTRACTION PHACO AND INTRAOCULAR LENS PLACEMENT (IOC) LEFT 00:40.0  20.6%  8.26;  Surgeon: Birder Robson, MD;  Location: [place];  Service: Ophthalmology;  Laterality: Left;   CATARACT EXTRACTION W/PHACO Right 04/26/2019   Procedure: CATARACT EXTRACTION PHACO AND INTRAOCULAR LENS PLACEMENT (IOC) RIGHT 6.83 00:46.0;  Surgeon: Birder Robson, MD;  Location: [place];  Service: Ophthalmology;  Laterality: Right;   CHOLECYSTECTOMY     COLONOSCOPY  2017   FOOT SURGERY      Social History Social History   Tobacco Use   Smoking status: Never   Smokeless tobacco: Never  Vaping Use   Vaping Use: Never used  Substance Use Topics   Alcohol use: No   Drug use: No    Family History Family History  Problem Relation Age of Onset   Stroke Mother    CAD Father    Colon cancer Other        paternal great grandfather   Breast cancer Neg Hx     Allergies  Allergen Reactions   Antihistamines, Chlorpheniramine-Type Other (See Comments)    "think crazy thoughts"   Ciprofloxacin Other (See Comments)    Patient cannot remember reaction    Dristan Cold [Chlorphen-Pe-Acetaminophen] Other (See Comments)    "think crazy thoughts"   Ginger Diarrhea   Nitrofurantoin Nausea Only     REVIEW OF SYSTEMS (Negative unless checked)  Constitutional: '[]'$ Weight loss  '[]'$ Fever  '[]'$ Chills Cardiac: '[]'$ Chest pain   '[]'$ Chest pressure   '[]'$ Palpitations   '[]'$ Shortness of breath when laying flat   '[]'$ Shortness of breath with exertion. Vascular:  '[]'$ Pain in legs with walking   '[x]'$ Pain in legs at rest  '[]'$ History of DVT   '[]'$ Phlebitis   '[x]'$ Swelling in legs   '[]'$ Varicose veins   '[]'$ Non-healing ulcers Pulmonary:   '[]'$ Uses home oxygen   '[]'$ Productive cough   '[]'$ Hemoptysis   '[]'$ Wheeze  '[]'$ COPD   '[]'$ Asthma Neurologic:  '[]'$ Dizziness   '[]'$ Seizures   '[]'$ History of stroke   '[]'$ History of TIA  '[]'$ Aphasia   '[]'$ Vissual changes   '[]'$ Weakness or numbness in arm   '[]'$ Weakness or numbness in leg Musculoskeletal:   '[x]'$ Joint swelling   '[x]'$ Joint pain   '[]'$ Low back pain Hematologic:  '[]'$ Easy bruising  '[]'$ Easy bleeding   '[]'$ Hypercoagulable state   '[]'$ Anemic Gastrointestinal:  '[]'$ Diarrhea   '[]'$ Vomiting  '[]'$ Gastroesophageal reflux/heartburn   '[]'$ Difficulty swallowing. Genitourinary:  '[]'$ Chronic kidney disease   '[]'$ Difficult urination  '[]'$ Frequent urination   '[]'$ Blood in urine Skin:  '[]'$ Rashes   '[]'$ Ulcers  Psychological:  '[]'$ History of anxiety   '[]'$  History of major depression.  Physical Examination  Vitals:   07/08/22 0732  BP: (!) 179/74  Pulse: (!) 49  Resp: 20  Temp: 97.9 F (36.6 C)  TempSrc: Oral  SpO2: 98%  Weight: 85.3 kg  Height: 5' 7.01" (1.702 m)   Body mass index is 29.44 kg/m. Gen: WD/WN, NAD Head: [place]/AT, No temporalis wasting.  Ear/Nose/Throat: Hearing grossly intact, nares w/o erythema or drainage, pinna without lesions Eyes: PER, EOMI, sclera nonicteric.  Neck: Supple, no gross masses.  No JVD.  Pulmonary:  Good air movement, no audible wheezing, no use of accessory muscles.  Cardiac: RRR, precordium not hyperdynamic. Vascular:  scattered varicosities present bilaterally.  Moderate  venous stasis changes to the legs bilaterally.  2+ soft pitting edema. CEAP C4sEpAsPr   Vessel Right Left  Radial Palpable Palpable  Gastrointestinal: soft, non-distended. No guarding/no peritoneal signs.  Musculoskeletal: M/S 5/5 throughout.  No deformity.  Neurologic: CN 2-12 intact. Pain and light touch intact in extremities.  Symmetrical.  Speech is fluent. Motor exam as listed above. Psychiatric: Judgment intact, Mood & affect appropriate for pt's clinical situation. Dermatologic: Venous rashes no ulcers noted.  No changes consistent with cellulitis. Lymph : No lichenification or skin changes of chronic lymphedema.  CBC Lab Results  Component Value Date   WBC 8.1 03/30/2022   HGB 12.3 03/30/2022   HCT 37.5 03/30/2022   MCV 93.1  03/30/2022   PLT 250 03/30/2022    BMET    Component Value Date/Time   NA 137 03/30/2022 0344   K 3.7 03/30/2022 0344   CL 106 03/30/2022 0344   CO2 24 03/30/2022 0344   GLUCOSE 141 (H) 03/30/2022 0344   BUN 19 03/30/2022 0344   CREATININE 0.83 03/30/2022 0344   CALCIUM 9.3 03/30/2022 0344   GFRNONAA >60 03/30/2022 0344   GFRAA >60 04/16/2015 0532   CrCl cannot be calculated (Patient's most recent lab result is older than the maximum 21 days allowed.).  COAG No results found for: "INR", "PROTIME"  Radiology No results found.   Assessment/Plan 1. Other pulmonary embolism without acute cor pulmonale, unspecified chronicity (HCC) IVC filter is strongly indicated prior to high risk orthopedic surgery.  Especially given the history of PE / DVT.   IVC filter placement will be done the within 1 to 2 weeks before surgery and we will work with orthopedic surgeon for coordination.  Risk and benefits were reviewed the patient.  Indications for the procedure were reviewed.  All questions were answered, the patient agrees to proceed.        The patient will follow-up with me 2-3 months after the joint replacement surgery to discuss removal (this was  also discussed today and the patient agrees with the plan to have the filter removed).    2. Mixed hyperlipidemia Continue statin as ordered and reviewed, no changes at this time       [name], MD  07/08/2022 8:20 AM

## 2022-07-09 ENCOUNTER — Encounter: Payer: Self-pay | Admitting: Vascular Surgery

## 2022-07-11 ENCOUNTER — Encounter
Admission: RE | Admit: 2022-07-11 | Discharge: 2022-07-11 | Disposition: A | Discharge status: Home / Self Care | Payer: HMO | Source: Ambulatory Visit | Attending: Orthopedic Surgery | Admitting: Orthopedic Surgery

## 2022-07-11 ENCOUNTER — Other Ambulatory Visit: Payer: Self-pay

## 2022-07-11 VITALS — BP 155/67 | HR 61 | Temp 98.1°F | Resp 16 | Ht 67.0 in | Wt 189.3 lb

## 2022-07-11 DIAGNOSIS — M1711 Unilateral primary osteoarthritis, right knee: Secondary | ICD-10-CM | POA: Diagnosis not present

## 2022-07-11 DIAGNOSIS — Z01818 Encounter for other preprocedural examination: Secondary | ICD-10-CM | POA: Insufficient documentation

## 2022-07-11 DIAGNOSIS — Z01812 Encounter for preprocedural laboratory examination: Secondary | ICD-10-CM

## 2022-07-11 HISTORY — DX: Atherosclerotic heart disease of native coronary artery without angina pectoris: I25.10

## 2022-07-11 LAB — TYPE AND SCREEN
ABO/RH(D): A NEG
Antibody Screen: NEGATIVE

## 2022-07-11 LAB — COMPREHENSIVE METABOLIC PANEL
ALT: 19 U/L (ref 0–44)
AST: 26 U/L (ref 15–41)
Albumin: 4.1 g/dL (ref 3.5–5.0)
Alkaline Phosphatase: 80 U/L (ref 38–126)
Anion gap: 13 (ref 5–15)
BUN: 25 mg/dL — ABNORMAL HIGH (ref 8–23)
CO2: 24 mmol/L (ref 22–32)
Calcium: 9.7 mg/dL (ref 8.9–10.3)
Chloride: 104 mmol/L (ref 98–111)
Creatinine, Ser: 0.92 mg/dL (ref 0.44–1.00)
GFR, Estimated: >60 mL/min (ref >60)
Glucose, Bld: 89 mg/dL (ref 70–99)
Potassium: 4.1 mmol/L (ref 3.5–5.1)
Sodium: 141 mmol/L (ref 135–145)
Total Bilirubin: 0.8 mg/dL (ref 0.3–1.2)
Total Protein: 7.7 g/dL (ref 6.5–8.1)

## 2022-07-11 LAB — CBC WITH DIFFERENTIAL/PLATELET
Abs Immature Granulocytes: 0.01 10*3/uL (ref 0.00–0.07)
Basophils Absolute: 0.1 10*3/uL (ref 0.0–0.1)
Basophils Relative: 1 %
Eosinophils Absolute: 0.3 10*3/uL (ref 0.0–0.5)
Eosinophils Relative: 5 %
HCT: 39 % (ref 36.0–46.0)
Hemoglobin: 12.4 g/dL (ref 12.0–15.0)
Immature Granulocytes: 0 %
Lymphocytes Relative: 34 %
Lymphs Abs: 1.9 10*3/uL (ref 0.7–4.0)
MCH: 30.2 pg (ref 26.0–34.0)
MCHC: 31.8 g/dL (ref 30.0–36.0)
MCV: 95.1 fL (ref 80.0–100.0)
Monocytes Absolute: 0.4 10*3/uL (ref 0.1–1.0)
Monocytes Relative: 8 %
Neutro Abs: 2.9 10*3/uL (ref 1.7–7.7)
Neutrophils Relative %: 52 %
Platelets: 276 10*3/uL (ref 150–400)
RBC: 4.1 MIL/uL (ref 3.87–5.11)
RDW: 12.5 % (ref 11.5–15.5)
WBC: 5.6 10*3/uL (ref 4.0–10.5)
nRBC: 0 % (ref 0.0–0.2)

## 2022-07-11 LAB — URINALYSIS, ROUTINE W REFLEX MICROSCOPIC
Bilirubin Urine: NEGATIVE
Glucose, UA: NEGATIVE mg/dL
Hgb urine dipstick: NEGATIVE
Ketones, ur: NEGATIVE mg/dL
Leukocytes,Ua: NEGATIVE
Nitrite: NEGATIVE
Protein, ur: NEGATIVE mg/dL
Specific Gravity, Urine: 1.015 (ref 1.005–1.030)
pH: 5 (ref 5.0–8.0)

## 2022-07-11 LAB — SURGICAL PCR SCREEN
MRSA, PCR: NEGATIVE
Staphylococcus aureus: NEGATIVE

## 2022-07-11 NOTE — Patient Instructions (Addendum)
Your procedure is scheduled on: Monday July 21, 2022. Report to the Registration Desk on the 1st floor of the [facility]. To find out your arrival time, please call [phone] between 1PM - 3PM on: Friday July 18, 2022. If your arrival time is 6:00 am, do not arrive before that time as the [facility] entrance doors do not open until 6:00 am.  REMEMBER: Instructions that are not followed completely may result in serious medical risk, up to and including death; or upon the discretion of your surgeon and anesthesiologist your surgery may need to be rescheduled.  Do not eat food after midnight the night before surgery.  No gum chewing or hard candies.  You may however, drink CLEAR liquids up to 2 hours before you are scheduled to arrive for your surgery. Do not drink anything within 2 hours of your scheduled arrival time.  Clear liquids include: - water  - apple juice without pulp - gatorade (not RED colors) - black coffee or tea (Do NOT add milk or creamers to the coffee or tea) Do NOT drink anything that is not on this list.  In addition, your doctor has ordered for you to drink the provided:  Ensure Pre-Surgery Clear Carbohydrate Drink  Gatorade G2 Drinking this carbohydrate drink up to two hours before surgery helps to reduce insulin resistance and improve patient outcomes. Please complete drinking 2 hours before scheduled arrival time.  One week prior to surgery: Stop Anti-inflammatories (NSAIDS) such as Advil, Aleve, Ibuprofen, Motrin, Naproxen, Naprosyn and Aspirin based products such as Excedrin, Goody's Powder, BC Powder. Stop ANY OVER THE COUNTER supplements until after surgery. Turmeric (QC TUMERIC COMPLEX PO)  You may however, continue to take Tylenol if needed for pain up until the day of surgery.  Continue taking all prescribed medications with the exception of the following:   Follow recommendations from Cardiologist or PCP regarding stopping blood  thinners.  TAKE ONLY THESE MEDICATIONS THE MORNING OF SURGERY WITH A SIP OF WATER:  atenolol (TENORMIN) 25 MG    Fleets enema or bowel prep as directed.  No Alcohol for 24 hours before or after surgery.  No Smoking including e-cigarettes for 24 hours before surgery.  No chewable tobacco products for at least 6 hours before surgery.  No nicotine patches on the day of surgery.  Do not use any "recreational" drugs for at least a week (preferably 2 weeks) before your surgery.  Please be advised that the combination of cocaine and anesthesia may have negative outcomes, up to and including death. If you test positive for cocaine, your surgery will be cancelled.  On the morning of surgery brush your teeth with toothpaste and water, you may rinse your mouth with mouthwash if you wish. Do not swallow any toothpaste or mouthwash.  Use CHG Soap or wipes as directed on instruction sheet.  Do not wear jewelry, make-up, hairpins, clips or nail polish.  Do not wear lotions, powders, or perfumes.   Do not shave body hair from the neck down 48 hours before surgery.  Contact lenses, hearing aids and dentures may not be worn into surgery.  Do not bring valuables to the hospital. [facility] is not responsible for any missing/lost belongings or valuables.   Notify your doctor if there is any change in your medical condition (cold, fever, infection).  Wear comfortable clothing (specific to your surgery type) to the hospital.  After surgery, you can help prevent lung complications by doing breathing exercises.  Take deep  breaths and cough every 1-2 hours. Your doctor may order a device called an Incentive Spirometer to help you take deep breaths. When coughing or sneezing, hold a pillow firmly against your incision with both hands. This is called "splinting." Doing this helps protect your incision. It also decreases belly discomfort.  If you are being admitted to the hospital overnight, leave  your suitcase in the car. After surgery it may be brought to your room.  In case of increased patient census, it may be necessary for you, the patient, to continue your postoperative care in the Same Day Surgery department.  If you are being discharged the day of surgery, you will not be allowed to drive home.  You will need a responsible individual to drive you home and stay with you for 24 hours after surgery.   If you are taking public transportation, you will need to have a responsible individual with you.  Please call the [facility] at [phone] if you have any questions about these instructions.  Surgery Visitation Policy:  Patients undergoing a surgery or procedure may have two family members or support persons with them as long as the person is not COVID-19 positive or experiencing its symptoms.   Inpatient Visitation:    Visiting hours are 7 a.m. to 8 p.m. Up to four visitors are allowed at one time in a patient room. The visitors may rotate out with other people during the day. One designated support person (adult) may remain overnight.  Due to an increase in RSV and influenza rates and associated hospitalizations, children ages 40 and under will not be able to visit patients in [facility]. Masks continue to be strongly recommended.     Preparing for Surgery with CHLORHEXIDINE GLUCONATE (CHG) Soap  Chlorhexidine Gluconate (CHG) Soap  o An antiseptic cleaner that kills germs and bonds with the skin to continue killing germs even after washing  o Used for showering the night before surgery and morning of surgery  Before surgery, you can play an important role by reducing the number of germs on your skin.  CHG (Chlorhexidine gluconate) soap is an antiseptic cleanser which kills germs and bonds with the skin to continue killing germs even after washing.  Please do not use if you have an allergy to CHG or antibacterial soaps. If your skin  becomes reddened/irritated stop using the CHG.  1. Shower the NIGHT BEFORE SURGERY and the MORNING OF SURGERY with CHG soap.  2. If you choose to wash your hair, wash your hair first as usual with your normal shampoo.  3. After shampooing, rinse your hair and body thoroughly to remove the shampoo.  4. Use CHG as you would any other liquid soap. You can apply CHG directly to the skin and wash gently with a scrungie or a clean washcloth.  5. Apply the CHG soap to your body only from the neck down. Do not use on open wounds or open sores. Avoid contact with your eyes, ears, mouth, and genitals (private parts). Wash face and genitals (private parts) with your normal soap.  6. Wash thoroughly, paying special attention to the area where your surgery will be performed.  7. Thoroughly rinse your body with warm water.  8. Do not shower/wash with your normal soap after using and rinsing off the CHG soap.  9. Pat yourself dry with a clean towel.  10. Wear clean pajamas to bed the night before surgery.  12. Place clean sheets on your  bed the night of your first shower and do not sleep with pets.  13. Shower again with the CHG soap on the day of surgery prior to arriving at the hospital.  14. Do not apply any deodorants/lotions/powders.  15. Please wear clean clothes to the hospital.How to Use an Incentive Spirometer  An incentive spirometer is a tool that measures how well you are filling your lungs with each breath. Learning to take long, deep breaths using this tool can help you keep your lungs clear and active. This may help to reverse or lessen your chance of developing breathing (pulmonary) problems, especially infection. You may be asked to use a spirometer: After a surgery. If you have a lung problem or a history of smoking. After a long period of time when you have been unable to move or be active. If the spirometer includes an indicator to show the highest number that you have reached,  your health care provider or respiratory therapist will help you set a goal. Keep a log of your progress as told by your health care provider. What are the risks? Breathing too quickly may cause dizziness or cause you to pass out. Take your time so you do not get dizzy or light-headed. If you are in pain, you may need to take pain medicine before doing incentive spirometry. It is harder to take a deep breath if you are having pain. How to use your incentive spirometer  Sit up on the edge of your bed or on a chair. Hold the incentive spirometer so that it is in an upright position. Before you use the spirometer, breathe out normally. Place the mouthpiece in your mouth. Make sure your lips are closed tightly around it. Breathe in slowly and as deeply as you can through your mouth, causing the piston or the ball to rise toward the top of the chamber. Hold your breath for 3-5 seconds, or for as long as possible. If the spirometer includes a coach indicator, use this to guide you in breathing. Slow down your breathing if the indicator goes above the marked areas. Remove the mouthpiece from your mouth and breathe out normally. The piston or ball will return to the bottom of the chamber. Rest for a few seconds, then repeat the steps 10 or more times. Take your time and take a few normal breaths between deep breaths so that you do not get dizzy or light-headed. Do this every 1-2 hours when you are awake. If the spirometer includes a goal marker to show the highest number you have reached (best effort), use this as a goal to work toward during each repetition. After each set of 10 deep breaths, cough a few times. This will help to make sure that your lungs are clear. If you have an incision on your chest or abdomen from surgery, place a pillow or a rolled-up towel firmly against the incision when you cough. This can help to reduce pain while taking deep breaths and coughing. General tips When you are  able to get out of bed: Walk around often. Continue to take deep breaths and cough in order to clear your lungs. Keep using the incentive spirometer until your health care provider says it is okay to stop using it. If you have been in the hospital, you may be told to keep using the spirometer at home. Contact a health care provider if: You are having difficulty using the spirometer. You have trouble using the spirometer as often as instructed.  Your pain medicine is not giving enough relief for you to use the spirometer as told. You have a fever. Get help right away if: You develop shortness of breath. You develop a cough with bloody mucus from the lungs. You have fluid or blood coming from an incision site after you cough. Summary An incentive spirometer is a tool that can help you learn to take long, deep breaths to keep your lungs clear and active. You may be asked to use a spirometer after a surgery, if you have a lung problem or a history of smoking, or if you have been inactive for a long period of time. Use your incentive spirometer as instructed every 1-2 hours while you are awake. If you have an incision on your chest or abdomen, place a pillow or a rolled-up towel firmly against your incision when you cough. This will help to reduce pain. Get help right away if you have shortness of breath, you cough up bloody mucus, or blood comes from your incision when you cough. This information is not intended to replace advice given to you by your health care provider. Make sure you discuss any questions you have with your health care provider. Document Revised: 07/04/2019 Document Reviewed: 07/04/2019 Elsevier Patient Education  [place].   Please go to the following website to access important education materials concerning your upcoming joint replacement.                                   [email or web address]

## 2022-07-14 NOTE — Pre-Procedure Instructions (Signed)
Patient Instructions - documented in this encounter Patient Instructions April [name], April [name] - 05/27/2022 2:15 PM EST  Formatting of this note might be different from the original. I will document in the note that we do not need a stress test before the surgery.  If you have any issues after surgery let me know and I'll see you back in clinic Electronically signed by April [name], April [name] at 05/27/2022 2:29 PM EST   Progress Notes - documented in this encounter Table of Contents for Progress Notes  [name], April [name], April [name] - 05/27/2022 2:15 PM EST  [name], April [name], April [name] - 05/27/2022 2:15 PM EST  April [name], April [name] - 05/27/2022 2:15 PM EST    April [name], April [name] - 05/27/2022 2:15 PM EST Addended by: Creola Corn [name] on: 05/27/2022 04:03 PM  Modules accepted: Orders   Electronically signed by April [name], April [name] at 05/27/2022 4:03 PM EST  Back to top of Progress Notes [name], April [name], April [name] - 05/27/2022 2:15 PM EST Formatting of this note is different from the original. Images from the original note were not included.   [place], [place] Date of Service: 05/27/2022  Return Patient Clinic Note  PCP: Referring Provider: Heron Nay, April [name] [address] [place] [facility] [place] [address] Phone: [phone] Fax: [phone] April [name], April [name] [place] [address] [place], [place] [address] Phone: [phone] Fax:  Assessment and Plan:  April [name] is a 77 y.o. with a history of CAD s/p DES to LCx in 2006 for NSTEMI, HTN, HLD, prior provoked PE who presents for follow up.  #Pre-op Risk Stratification Presents for annual cardiology follow-up and also for pre-op risk with upcoming R knee surgery next month. She has been stable without chest or arm pain since her PCI in 2006. Good exertional tolerance subjectively, and I  walked her up a 2 flights of stairs with only knee pain and no significant dyspnea or chest pain/arm pain. Her risk factors have been well controlled. She has some risk given her age and hx of CAD, though overall should be considered low to low-intermediate risk for what is not considered a high risk surgery and there is no indication for further ischemic testing at this point in time. She is ok to hold the aspirin pre-op, but recommended aggressive DVT prophylaxis post-op given hx of provoked DVT/PE. Will order an echo as a baseline prior to the surgery to have a baseline study in case there are any complications post-op so that we can know if there are acute changes in cardiac fxn.  # CAD: Received DES to left circumflex in 2006 after presenting with NSTEMI. Symptoms at that time were mild arm pain associated with stress (husband was having an MI himself at that time). Appears to have done well since that time without further major events or interventions. Last documented EF was on NM stress 2012 and was 60%. She has done well since that time without major issues including chest pain, shortness of breath, recurrent ACS events, hospitalizations, etc. She had a lapse in exercise due to the pandemic and had gained some weight but is back to walking now for exercise and has no physical limitations. Currently feeling well without need for medication changes at this time. - Continue ASA 81mg  daily, Atorvastatin 40mg  daily, atenolol 25mg  daily - Follow up in 1 year or sooner if needed - strongly advised her  to contact us if she developed any new symptoms  # HTN: Blood pressure is elevated but reports good control at home. PCP following. - Continue Atenolol 25mg  daily for now  # HLD: Last lipid profile 03/2021, LDL 53. HDL 66 - Continue Atorvastatin 40 mg for now, LDL has been at goal <70  # History of PE: Considered to be provoked and related to severe illness and dehydration in 2017. Now off Eliquis and  has had no further symptoms of shortness of breath.  Return in about 1 year (around 05/28/2023).  Orders Placed This Encounter Procedures ECG 12 Lead  April [name] Cardiovascular Disease, PGY-5 [employer]  The patient was seen and discussed with Dr. Caryl Comes.  Subjective:   Reason for Visit: Follow up  History of Present Illness: April [name] is a 77 y.o. with a history of CAD s/p DES to LCx in 2006 for NSTEMI, HTN, HLD who presents for follow up. She was initially seen in 2006 at which time she presented mild left arm pain and minimal chest pain. She was found to have NSTEMI and received DES to the left circumflex. LHC was also notable for 20% pRCA, 40% ostial D1 disease. She had recurrence of symptoms in 2012 and she underwent exercise SPECT MPI 09/2010 which was normal. She developed norovirus and in the context of profound dehydration developed bilateral PEs treated at [facility]. She completed AC therapy with Apixaban in June 2017 and has not had any recurrence of symptoms.  04/2021 she is feeling excellent. She is walking 2-3 miles at a time, 3-4x per week with a friend. She can go up several flights of stairs at a time without needing to break. She experiences no chest pain or left arm pain/weakness. At her NSTEMI diagnosis her symptoms were related to her left arm (weakness, mild aching). Denies exertional lightheadedness or syncope. Denies PND, orthopnea, shortness of breath out of proportion to her activity. No lower extremity edema.  Today, 04/2022 she is feeling well. She has not been walking quite as much as before given severe R knee OA. Still can ambulate those distances without chest or arm pain, and also can climb multiple flights of stairs, but she avoids these where she can as they flare her knee up. Continues on stable medical therapy with no changes made in quite some time. No syncope, presyncope, chest pain, dyspnea, PND, orthopnea, LE edema.  Cardiovascular  History: CAD s/p DES to LCx 2006 Bilateral pulmonary emboli (provoked, 03/2015 s/p six months of Eliquis) HTN HLD  Interventions / Surgery: LHC 10/06 - 90% dLCx, 20% pRCA, 40% ostial D1, anterolateral and mid inferior hypokinesis, EF 45-50%. Cypher DES 2.5 by 13 mm to LCx.  Imaging: EKG today - Sinus bradycardia, poor R wave progression Exercise SPECT 6/12: Exercised 6:19, 7.4 METS, Peak HR 146. Stopped for dyspnea, no ischemic EKG changes. Normal myocardial perfusion study. EF 60% post stress.  Relevant Past Medical History: Active Ambulatory Problems Diagnosis Date Noted Coronary artery disease 01/02/2015 HTN (hypertension) 01/11/2018 HLD (hyperlipidemia) 01/11/2018 Other pulmonary embolism without acute cor pulmonale (CMS-HCC) 03/06/2019  Resolved Ambulatory Problems Diagnosis Date Noted No Resolved Ambulatory Problems  No Additional Past Medical History  Patient's Medications New Prescriptions No medications on file Previous Medications ACETAMINOPHEN (TYLENOL) 500 MG TABLET Take 1 tablet (500 mg total) by mouth every eight (8) hours as needed. ASPIRIN (ECOTRIN) 81 MG TABLET Take 1 tablet (81 mg total) by mouth. Frequency:QD Dosage:81 MG Instructions: Note:Dose: 81MG  ATENOLOL (TENORMIN) 25 MG  TABLET TAKE 1 TABLET BY MOUTH ONCE DAILY ATORVASTATIN (LIPITOR) 40 MG TABLET Take 1 tablet (40 mg total) by mouth daily. CALCIUM CITRATE-VITAMIN D (CITRACAL+D) 315 MG-5 MCG (200 UNIT) PER TABLET Take by mouth. Frequency:QD Dosage:0.0 Instructions: Note:Dose: 315 MG-200 CHOLECALCIFEROL, VITAMIN D3, 50 MCG (2,000 UNIT) CAP Take 1 capsule (50 mcg total) by mouth in the morning. EPINEPHRINE (EPIPEN) 0.3 MG/0.3 ML INJECTION Inject into the muscle. Modified Medications No medications on file Discontinued Medications No medications on file  Allergies: Allergies Allergen Reactions Ciprofloxacin Other reaction(s): Other (See Comments) Patient cannot remember reaction Ginger  Diarrhea Pheniramine Other reaction(s): Other (See Comments) "think crazy thoughts" Other Anxiety DRISTAN  Social History: She reports that she has never smoked. She has never used smokeless tobacco.  Family History: Her family history is not on file.  Review of Systems 10 systems were reviewed and negative except as noted in HPI.  Objective:  Physical Exam BP 147/80 (BP Site: L Arm, BP Position: Sitting, BP Cuff Size: Medium) Comment: recheck  Pulse 60  Ht 170.2 cm (5\' 7" )  Wt 85.3 kg (188 lb)  SpO2 98%  BMI 29.44 kg/m Wt Readings from Last 3 Encounters: 05/27/22 85.3 kg (188 lb) 05/21/21 87.3 kg (192 lb 6.4 oz) 05/15/20 89.8 kg (198 lb)  General: Older woman, well appearing, no distress. HEENT: EOMI, sclerae anicteric, MMM. Neck: Supple, no carotid bruit. JVP not appreciably elevated Lungs: CTAB bilaterally with normal WOB. Heart: Regular, normal rate, no murmurs, gallops or rubs. Abdomen: Soft, non-tender, non-distended. Extremities: 2+ radial pulses bilaterally. No edema bilaterally. Skin: No lesions/rashes. Neurologic: No focal deficits.  ECG 05/27/2022: Sinus bradycardia, unremarkable ECG. Stable from prior  Most recent labs  Labs reviewed, LDL 1 month ago 53, HDL 66. Electronically signed by April [name], April [name] at 05/27/2022 4:03 PM EST  Back to top of Progress Notes April [name], April [name] - 05/27/2022 2:15 PM EST Formatting of this note is different from the original.  I saw and evaluated the patient, participating in the key portions of the service. I reviewed the resident's note. I agree with the resident's findings and plan.  Aliene Beams, April [name]  Electronically signed by April [name], April [name] at 05/27/2022 2:52 PM EST  Back to top of Progress Notes Plan of Treatment - documented as of this encounter Not on file  Procedures - documented in this encounter Procedures Procedure Name Priority Date/Time Associated Diagnosis Comments   ECG 12-LEAD Routine 05/27/2022 1:57 PM EST Coronary artery disease involving native coronary artery of native heart without angina pectoris  Hypertension, unspecified type  Hyperlipidemia, unspecified hyperlipidemia type  Other pulmonary embolism without acute cor pulmonale, unspecified chronicity (CMS-HCC)  Results for this procedure are in the results section.    Imaging Results - documented in this encounter Echocardiogram W Colorflow Spectral Doppler (06/03/2022 10:43 AM EST) Imaging Results - Echocardiogram W Colorflow Spectral Doppler (06/03/2022 10:43 AM EST) Anatomical Region Laterality Modality  Chest, Vascular   Ultrasound   Imaging Results - Echocardiogram W Colorflow Spectral Doppler (06/03/2022 10:43 AM EST) Specimen (Source) Anatomical Location / Laterality Collection Method / Volume Collection Time Received Time        06/03/2022 10:05 AM EST     Imaging Results - Echocardiogram W Colorflow Spectral Doppler (06/03/2022 10:43 AM EST) Narrative  06/10/2022 11:24 AM EST  Patient Info Name:     Lakhia Bredemeier Age:     58 years DOB:     1946/02/19 Gender:     Female  MRN:     [id number] Accession #:     [id number] UN Account #:     [id number] Ht:     170 cm Wt:     85 kg BSA:     2.03 m2 BP:     147 /     80 mmHg Exam Date:     06/03/2022 10:05 AM Admit Date:     06/03/2022  Exam Type:     ECHOCARDIOGRAM W COLORFLOW SPECTRAL DOPPLER  Technical Quality:     Fair  Staff Sonographer:     Campbell Riches, [place], [place] Referring Physician:     Marcelyn [name] April [name] Reading Fellow:     April [name]  Study Info Indications     - shortness of breath    I25.10 - Coronary artery disease involving native coronary artery of native heart without angina pectoris    I10 - Hypertension,  unspecified type Procedure(s)  Complete two-dimensional, color flow and Doppler transthoracic echocardiogram is performed.   Summary  1. The left ventricle is normal  in size with mildly increased wall thickness.  2. The left ventricular systolic function is normal, LVEF is visually estimated at 60-65%.  3. The aortic valve is trileaflet with mildly thickened leaflets with normal excursion.  4. There is mild aortic regurgitation.  5. The right ventricle is normal in size, with normal systolic function.   Left Ventricle  The left ventricle is normal in size with mildly increased wall thickness. The left ventricular systolic function is normal, LVEF is visually estimated at 60-65%. There is normal left ventricular diastolic function.  Right Ventricle  The right ventricle is normal in size, with normal systolic function. Right ventricle wall thickness is normal.   Left Atrium  The left atrium is normal in size.  Right Atrium  The right atrium is normal in size.   Aortic Valve  The aortic valve is trileaflet with mildly thickened leaflets with normal excursion. There is mild aortic regurgitation. There is no evidence of a significant transvalvular gradient.  Mitral Valve  The mitral valve leaflets are normal with normal leaflet mobility. Mitral annular calcification is present (mild). There is trivial mitral valve regurgitation.  Tricuspid Valve  The tricuspid valve leaflets are normal, with normal leaflet mobility. There is no significant tricuspid regurgitation. There is no tricuspid valve stenosis. The pulmonary systolic pressure cannot be estimated due to insufficient TR signal.  Pulmonic Valve  The pulmonic valve is normal. There is mild pulmonic regurgitation. There is no evidence of a significant transvalvular gradient.   Aorta  The aorta is normal in size in the visualized segments.  Inferior Vena Cava  IVC size and inspiratory change suggest normal right atrial pressure. (0-5 mmHg).  Pericardium/Pleural  There is no pericardial effusion.  Other Findings  Rhythm: Sinus  Rhythm.   Ventricles ---------------------------------------------------------------------- Name                                 Value        Normal ----------------------------------------------------------------------  LV Dimensions 2D/MM ---------------------------------------------------------------------- IVS Diastolic Thickness (2D)                                1.2 cm       0.6-0.9 LVID Diastole (2D)  4.0 cm       [id number] LVPW Diastolic Thickness (2D)                                1.1 cm       0.6-0.9 LVID Systole (2D)                   2.9 cm       2.2-3.5 LVOT Diameter                       2.0 cm                RV Dimensions 2D/MM ---------------------------------------------------------------------- RV Basal Diastolic Dimension                           2.7 cm       2.5-4.1  Atria ---------------------------------------------------------------------- Name                                 Value        Normal ----------------------------------------------------------------------  LA Dimensions ---------------------------------------------------------------------- LA Dimension (2D)                   3.8 cm       2.7-3.8 LA Volume Index (4C A-L)        30.19 ml/m2               LA Volume Index (2C A-L)        25.11 ml/m2               LA Volume (BP MOD)                   55 ml               LA Volume Index (BP MOD)        27.03 ml/m2   16.00-34.00  RA Dimensions ---------------------------------------------------------------------- RA Area (4C)                      10.1 cm2        <=18.0 RA Area (4C) Index              5.0 cm2/m2               RA ESV Index (4C MOD)              9 ml/m2         15-27  Left Ventricular Outflow Tract ---------------------------------------------------------------------- Name                                 Value        Normal ----------------------------------------------------------------------  LVOT  2D ---------------------------------------------------------------------- LVOT Diameter                       2.0 cm               LVOT Area                          3.1 cm2                LVOT Doppler ---------------------------------------------------------------------- LVOT  Peak Velocity                 0.9 m/s  Aortic Valve ---------------------------------------------------------------------- Name                                 Value        Normal ----------------------------------------------------------------------  AV Doppler ---------------------------------------------------------------------- AV Peak Velocity                   1.2 m/s               AV Peak Gradient                    5 mmHg               AV Mean Gradient                    3 mmHg               AV VTI                               28 cm               AV Area (Cont Eq Vel)              2.4 cm2               AV Area Index (Cont Eq Vel)     1.2 cm2/m2               AV DI (Vel)                           0.77  Mitral Valve ---------------------------------------------------------------------- Name                                 Value        Normal ----------------------------------------------------------------------  MV Diastolic Function ---------------------------------------------------------------------- MV [name] Peak Velocity                 96 cm/s               MV A Peak Velocity                 82 cm/s               MV [name]/A                                 1.2                MV Annular TDI ---------------------------------------------------------------------- MV Septal [name]' Velocity             9.3 cm/s         >=8.0 MV [name]/[name]' (Septal)                      10.4               MV Lateral [name]' Velocity            8.4 cm/s        >=10.0 MV [name]/[name]' (Lateral)  11.5               MV [name]' Average                     8.8 cm/s               MV [name]/[name]' (Average)                     11.0  Tricuspid  Valve ---------------------------------------------------------------------- Name                                 Value        Normal ----------------------------------------------------------------------  Estimated PAP/RSVP ---------------------------------------------------------------------- RA Pressure                         3 mmHg           <=5  Pulmonic Valve ---------------------------------------------------------------------- Name                                 Value        Normal ----------------------------------------------------------------------  PV Doppler ---------------------------------------------------------------------- PV Peak Velocity                   0.8 m/s  Aorta ---------------------------------------------------------------------- Name                                 Value        Normal ----------------------------------------------------------------------  Ascending Aorta ---------------------------------------------------------------------- Ao Root Diameter (2D)               2.9 cm               Ao Root Diam Index (2D)          1.4 cm/m2               Asc Ao Diameter                     2.9 cm  Venous ---------------------------------------------------------------------- Name                                 Value        Normal ----------------------------------------------------------------------  IVC/SVC ---------------------------------------------------------------------- IVC Diameter (Exp 2D)               1.3 cm         <=2.1   Report Signatures Finalized by April [name]  April [name] on 06/10/2022 11:24 AM Preliminary amended by April [name] on 06/10/2022 11:10 AM Resident April [name] on 06/10/2022 11:07 AM    Imaging Results - Echocardiogram W Colorflow Spectral Doppler (06/03/2022 10:43 AM EST) Procedure Note  April [name], April [name] - 06/10/2022  Formatting of this note might be different from the  original. Patient Info Name: Alilia Nish Age: 47 years DOB: 1945-05-02 Gender: Female MRN: [id number] Accession #: [id number] UN Account #: [id number] Ht: 170 cm Wt: 85 kg BSA: 2.03 m2 BP: 147 / 80 mmHg Exam Date: 06/03/2022 10:05 AM Admit Date: 06/03/2022  Exam Type: ECHOCARDIOGRAM W COLORFLOW SPECTRAL DOPPLER  Technical Quality: Fair  Staff Sonographer: Campbell Riches, [place], [place] Referring Physician: Marcelyn [name] April [name] Reading Fellow:  April [name] [name]  Study Info Indications - shortness of breath I25.10 - Coronary artery disease involving native coronary artery of native heart without angina pectoris I10 - Hypertension, unspecified type Procedure(s) Complete two-dimensional, color flow and Doppler transthoracic echocardiogram is performed.   Summary 1. The left ventricle is normal in size with mildly increased wall thickness. 2. The left ventricular systolic function is normal, LVEF is visually estimated at 60-65%. 3. The aortic valve is trileaflet with mildly thickened leaflets with normal excursion. 4. There is mild aortic regurgitation. 5. The right ventricle is normal in size, with normal systolic function.   Left Ventricle The left ventricle is normal in size with mildly increased wall thickness. The left ventricular systolic function is normal, LVEF is visually estimated at 60-65%. There is normal left ventricular diastolic function.  Right Ventricle The right ventricle is normal in size, with normal systolic function. Right ventricle wall thickness is normal.   Left Atrium The left atrium is normal in size.  Right Atrium The right atrium is normal in size.   Aortic Valve The aortic valve is trileaflet with mildly thickened leaflets with normal excursion. There is mild aortic regurgitation. There is no evidence of a significant transvalvular gradient.  Mitral Valve The mitral valve leaflets are normal with normal  leaflet mobility. Mitral annular calcification is present (mild). There is trivial mitral valve regurgitation.  Tricuspid Valve The tricuspid valve leaflets are normal, with normal leaflet mobility. There is no significant tricuspid regurgitation. There is no tricuspid valve stenosis. The pulmonary systolic pressure cannot be estimated due to insufficient TR signal.  Pulmonic Valve The pulmonic valve is normal. There is mild pulmonic regurgitation. There is no evidence of a significant transvalvular gradient.   Aorta The aorta is normal in size in the visualized segments.  Inferior Vena Cava IVC size and inspiratory change suggest normal right atrial pressure. (0-5 mmHg).  Pericardium/Pleural There is no pericardial effusion.  Other Findings Rhythm: Sinus Rhythm.   Ventricles ---------------------------------------------------------------------- Name Value Normal ----------------------------------------------------------------------  LV Dimensions 2D/MM ---------------------------------------------------------------------- IVS Diastolic Thickness (2D) 1.2 cm 0.6-0.9 LVID Diastole (2D) 4.0 cm [id number] LVPW Diastolic Thickness (2D) 1.1 cm 0.6-0.9 LVID Systole (2D) 2.9 cm 2.2-3.5 LVOT Diameter 2.0 cm  RV Dimensions 2D/MM ---------------------------------------------------------------------- RV Basal Diastolic Dimension 2.7 cm 2.5-4.1  Atria ---------------------------------------------------------------------- Name Value Normal ----------------------------------------------------------------------  LA Dimensions ---------------------------------------------------------------------- LA Dimension (2D) 3.8 cm 2.7-3.8 LA Volume Index (4C A-L) 30.19 ml/m2 LA Volume Index (2C A-L) 25.11 ml/m2 LA Volume (BP MOD) 55 ml LA Volume Index (BP MOD) 27.03 ml/m2 16.00-34.00  RA Dimensions ---------------------------------------------------------------------- RA Area (4C)  10.1 cm2 <=18.0 RA Area (4C) Index 5.0 cm2/m2 RA ESV Index (4C MOD) 9 ml/m2 15-27  Left Ventricular Outflow Tract ---------------------------------------------------------------------- Name Value Normal ----------------------------------------------------------------------  LVOT 2D ---------------------------------------------------------------------- LVOT Diameter 2.0 cm LVOT Area 3.1 cm2  LVOT Doppler ---------------------------------------------------------------------- LVOT Peak Velocity 0.9 m/s  Aortic Valve ---------------------------------------------------------------------- Name Value Normal ----------------------------------------------------------------------  AV Doppler ---------------------------------------------------------------------- AV Peak Velocity 1.2 m/s AV Peak Gradient 5 mmHg AV Mean Gradient 3 mmHg AV VTI 28 cm AV Area (Cont Eq Vel) 2.4 cm2 AV Area Index (Cont Eq Vel) 1.2 cm2/m2 AV DI (Vel) 0.77  Mitral Valve ---------------------------------------------------------------------- Name Value Normal ----------------------------------------------------------------------  MV Diastolic Function ---------------------------------------------------------------------- MV [name] Peak Velocity 96 cm/s MV A Peak Velocity 82 cm/s MV [name]/A 1.2  MV Annular TDI ---------------------------------------------------------------------- MV Septal [name]' Velocity 9.3 cm/s >=8.0 MV [name]/[name]' (Septal) 10.4 MV Lateral [name]' Velocity 8.4 cm/s >=10.0 MV [name]/[name]' (Lateral) 11.5 MV [name]'  Average 8.8 cm/s MV [name]/[name]' (Average) 11.0  Tricuspid Valve ---------------------------------------------------------------------- Name Value Normal ----------------------------------------------------------------------  Estimated PAP/RSVP ---------------------------------------------------------------------- RA Pressure 3 mmHg <=5  Pulmonic  Valve ---------------------------------------------------------------------- Name Value Normal ----------------------------------------------------------------------  PV Doppler ---------------------------------------------------------------------- PV Peak Velocity 0.8 m/s  Aorta ---------------------------------------------------------------------- Name Value Normal ----------------------------------------------------------------------  Ascending Aorta ---------------------------------------------------------------------- Ao Root Diameter (2D) 2.9 cm Ao Root Diam Index (2D) 1.4 cm/m2 Asc Ao Diameter 2.9 cm  Venous ---------------------------------------------------------------------- Name Value Normal ----------------------------------------------------------------------  IVC/SVC ---------------------------------------------------------------------- IVC Diameter (Exp 2D) 1.3 cm <=2.1   Report Signatures Finalized by April [name] April [name] on 06/10/2022 11:24 AM Preliminary amended by April [name] on 06/10/2022 11:10 AM Resident April [name] on 06/10/2022 11:07 AM   Imaging Results - Echocardiogram W Colorflow Spectral Doppler (06/03/2022 10:43 AM EST) Authorizing Provider Result Type  April [name] April [name] CV ECHO ORDERABLES    ECG Results - documented in this encounter ECG 12 Lead (05/27/2022 1:57 PM EST) ECG Results - ECG 12 Lead (05/27/2022 1:57 PM EST) Component Value Ref Range Test Method Analysis Time Performed At Pathologist Signature  EKG Systolic BP   mmHg     [facility]    EKG Diastolic BP   mmHg     [facility]    EKG Ventricular Rate 53 BPM     [facility]    EKG Atrial Rate 53 BPM     [facility]    EKG P-R Interval 168 ms     [facility]    EKG QRS Duration 74 ms     [facility]    EKG Q-T Interval 436 ms     [facility]    EKG QTC Calculation 409 ms     [facility]    EKG Calculated P Axis 54 degrees     [facility]    EKG Calculated R Axis -4 degrees     [facility]    EKG Calculated T Axis 29 degrees     [facility]    QTC Fredericia 418 ms     [facility]     ECG Results - ECG 12 Lead (05/27/2022 1:57 PM EST) Specimen (Source) Anatomical Location / Laterality Collection Method / Volume Collection Time Received Time        05/27/2022 1:57 PM EST 05/27/2022 3:57 PM EST   ECG Results - ECG 12 Lead (05/27/2022 1:57 PM EST) Narrative  [facility] - 05/27/2022 3:58 PM EST  SINUS BRADYCARDIA MINIMAL VOLTAGE CRITERIA FOR LVH, MAY BE NORMAL VARIANT ( R in aVL ) INFERIOR INFARCT , AGE UNDETERMINED POOR R WAVE PROGRESSION IN ANTERIOR PRECORDIAL LEADS CANNOT RULE OUT ANTERIOR INFARCT  (CITED ON OR BEFORE 27-May-2022) ABNORMAL ECG WHEN COMPARED WITH ECG OF 21-May-2021 16:15, INFERIOR INFARCT IS NOW PRESENT Confirmed by Michaelle Birks ([id number]) on 05/27/2022 3:57:59 PM    ECG Results - ECG 12 Lead (05/27/2022 1:57 PM EST) Procedure Note  April [name], April [name] - 05/27/2022  Formatting of this note might be different from the original. [place] LVH, MAY BE NORMAL VARIANT ( R in aVL ) INFERIOR INFARCT , AGE UNDETERMINED POOR R WAVE PROGRESSION IN ANTERIOR PRECORDIAL LEADS CANNOT RULE OUT ANTERIOR INFARCT (CITED ON OR BEFORE 27-May-2022) ABNORMAL ECG WHEN COMPARED WITH ECG OF 21-May-2021 16:15, INFERIOR INFARCT IS NOW PRESENT Confirmed by Michaelle Birks ([id number]) on 05/27/2022 3:57:59 PM   ECG Results - ECG 12 Lead (05/27/2022 1:57 PM EST) Authorizing Provider Result Type  April [name] April [name]  ECG ORDERABLES   ECG Results - ECG 12 Lead (05/27/2022 1:57 PM EST) Performing Organization Address City/State/ZIP Code Phone Number  [facility]  [id number] [place] [address]      Visit Diagnoses - documented in this encounter Visit Diagnoses Diagnosis  Coronary artery disease involving native coronary artery of native heart without angina pectoris - Primary   Hypertension, unspecified type   Hyperlipidemia, unspecified  hyperlipidemia type   Other pulmonary embolism without acute cor pulmonale, unspecified chronicity (CMS-HCC)

## 2022-07-14 NOTE — Pre-Procedure Instructions (Signed)
Cardiac clearance note in care everywhere from Dr Audie Pinto to intermediate risk

## 2022-07-21 ENCOUNTER — Encounter: Payer: Self-pay | Admitting: Orthopedic Surgery

## 2022-07-21 ENCOUNTER — Other Ambulatory Visit: Payer: Self-pay

## 2022-07-21 ENCOUNTER — Ambulatory Visit: Payer: HMO

## 2022-07-21 ENCOUNTER — Observation Stay
Admission: RE | Admit: 2022-07-21 | Discharge: 2022-07-22 | Disposition: A | Discharge status: Home Health | Payer: HMO | Attending: Orthopedic Surgery | Admitting: Orthopedic Surgery

## 2022-07-21 ENCOUNTER — Encounter
Admission: RE | Disposition: A | Discharge status: Home Health | Payer: Self-pay | Source: Home / Self Care | Attending: Orthopedic Surgery

## 2022-07-21 ENCOUNTER — Ambulatory Visit: Payer: HMO | Admitting: Certified Registered"

## 2022-07-21 ENCOUNTER — Ambulatory Visit: Payer: HMO | Admitting: Urgent Care

## 2022-07-21 DIAGNOSIS — Z79899 Other long term (current) drug therapy: Secondary | ICD-10-CM | POA: Diagnosis not present

## 2022-07-21 DIAGNOSIS — I2510 Atherosclerotic heart disease of native coronary artery without angina pectoris: Secondary | ICD-10-CM | POA: Diagnosis not present

## 2022-07-21 DIAGNOSIS — Z86711 Personal history of pulmonary embolism: Secondary | ICD-10-CM | POA: Diagnosis not present

## 2022-07-21 DIAGNOSIS — Z96651 Presence of right artificial knee joint: Secondary | ICD-10-CM

## 2022-07-21 DIAGNOSIS — Z7982 Long term (current) use of aspirin: Secondary | ICD-10-CM | POA: Insufficient documentation

## 2022-07-21 DIAGNOSIS — M1711 Unilateral primary osteoarthritis, right knee: Principal | ICD-10-CM | POA: Insufficient documentation

## 2022-07-21 DIAGNOSIS — I10 Essential (primary) hypertension: Secondary | ICD-10-CM | POA: Diagnosis not present

## 2022-07-21 DIAGNOSIS — Z471 Aftercare following joint replacement surgery: Secondary | ICD-10-CM | POA: Diagnosis not present

## 2022-07-21 HISTORY — PX: TOTAL KNEE ARTHROPLASTY: SHX125

## 2022-07-21 LAB — ABO/RH: ABO/RH(D): A NEG

## 2022-07-21 SURGERY — ARTHROPLASTY, KNEE, TOTAL
Anesthesia: Spinal | Site: Knee | Laterality: Right

## 2022-07-21 MED ORDER — TRAMADOL HCL 50 MG PO TABS
50.0000 mg | ORAL_TABLET | Freq: Four times a day (QID) | ORAL | Status: DC | PRN
  Administered 2022-07-21: 50 mg via ORAL

## 2022-07-21 MED ORDER — KETOROLAC TROMETHAMINE 15 MG/ML IJ SOLN
INTRAMUSCULAR | Status: AC
  Administered 2022-07-21: 7.5 mg via INTRAVENOUS
  Filled 2022-07-21: qty 1

## 2022-07-21 MED ORDER — MORPHINE SULFATE (PF) 4 MG/ML IV SOLN: 0.5000 mg | INTRAVENOUS | Status: DC | PRN

## 2022-07-21 MED ORDER — OXYCODONE HCL 5 MG PO TABS: 5.0000 mg | ORAL_TABLET | Freq: Once | ORAL | Status: DC | PRN

## 2022-07-21 MED ORDER — ATORVASTATIN CALCIUM 20 MG PO TABS: 40.0000 mg | ORAL_TABLET | Freq: Every evening | ORAL | Status: DC

## 2022-07-21 MED ORDER — ACETAMINOPHEN 500 MG PO TABS
ORAL_TABLET | ORAL | Status: AC
  Administered 2022-07-21: 1000 mg via ORAL
  Filled 2022-07-21: qty 2

## 2022-07-21 MED ORDER — SEVOFLURANE IN SOLN
RESPIRATORY_TRACT | Status: AC
  Filled 2022-07-21: qty 250

## 2022-07-21 MED ORDER — FAMOTIDINE 20 MG PO TABS: 20.0000 mg | ORAL_TABLET | Freq: Once | ORAL | Status: AC

## 2022-07-21 MED ORDER — CEFAZOLIN SODIUM-DEXTROSE 2-4 GM/100ML-% IV SOLN
INTRAVENOUS | Status: AC
  Filled 2022-07-21: qty 100

## 2022-07-21 MED ORDER — TRANEXAMIC ACID-NACL 1000-0.7 MG/100ML-% IV SOLN: 1000.0000 mg | INTRAVENOUS | Status: DC

## 2022-07-21 MED ORDER — EPINEPHRINE PF 1 MG/ML IJ SOLN
INTRAMUSCULAR | Status: AC
  Filled 2022-07-21: qty 1

## 2022-07-21 MED ORDER — FENTANYL CITRATE (PF) 100 MCG/2ML IJ SOLN
INTRAMUSCULAR | Status: DC | PRN
  Administered 2022-07-21 (×2): 50 ug via INTRAVENOUS

## 2022-07-21 MED ORDER — DOCUSATE SODIUM 100 MG PO CAPS: 100.0000 mg | ORAL_CAPSULE | Freq: Two times a day (BID) | ORAL | Status: DC

## 2022-07-21 MED ORDER — TRANEXAMIC ACID 1000 MG/10ML IV SOLN
INTRAVENOUS | Status: DC | PRN
  Administered 2022-07-21: 1000 mg via TOPICAL

## 2022-07-21 MED ORDER — CHLORHEXIDINE GLUCONATE 0.12 % MT SOLN: 15.0000 mL | Freq: Once | OROMUCOSAL | Status: AC

## 2022-07-21 MED ORDER — CEFAZOLIN SODIUM-DEXTROSE 2-4 GM/100ML-% IV SOLN: 2.0000 g | Freq: Four times a day (QID) | INTRAVENOUS | Status: AC

## 2022-07-21 MED ORDER — DEXAMETHASONE SODIUM PHOSPHATE 10 MG/ML IJ SOLN
8.0000 mg | Freq: Once | INTRAMUSCULAR | Status: AC
  Administered 2022-07-21: 10 mg via INTRAVENOUS

## 2022-07-21 MED ORDER — PANTOPRAZOLE SODIUM 40 MG PO TBEC: 40.0000 mg | DELAYED_RELEASE_TABLET | Freq: Every day | ORAL | Status: DC

## 2022-07-21 MED ORDER — 0.9 % SODIUM CHLORIDE (POUR BTL) OPTIME
TOPICAL | Status: DC | PRN
  Administered 2022-07-21: 500 mL

## 2022-07-21 MED ORDER — HYDROCODONE-ACETAMINOPHEN 5-325 MG PO TABS: 1.0000 | ORAL_TABLET | ORAL | Status: DC | PRN

## 2022-07-21 MED ORDER — KETOROLAC TROMETHAMINE 15 MG/ML IJ SOLN: 7.5000 mg | Freq: Four times a day (QID) | INTRAMUSCULAR | Status: AC

## 2022-07-21 MED ORDER — DEXAMETHASONE SODIUM PHOSPHATE 10 MG/ML IJ SOLN
INTRAMUSCULAR | Status: AC
  Filled 2022-07-21: qty 1

## 2022-07-21 MED ORDER — PANTOPRAZOLE SODIUM 40 MG PO TBEC
DELAYED_RELEASE_TABLET | ORAL | Status: AC
  Administered 2022-07-21: 40 mg via ORAL
  Filled 2022-07-21: qty 1

## 2022-07-21 MED ORDER — CHLORHEXIDINE GLUCONATE 0.12 % MT SOLN
OROMUCOSAL | Status: AC
  Administered 2022-07-21: 15 mL via OROMUCOSAL
  Filled 2022-07-21: qty 15

## 2022-07-21 MED ORDER — BUPIVACAINE HCL (PF) 0.5 % IJ SOLN
INTRAMUSCULAR | Status: DC | PRN
  Administered 2022-07-21: 3 mL

## 2022-07-21 MED ORDER — CEFAZOLIN SODIUM-DEXTROSE 2-4 GM/100ML-% IV SOLN
2.0000 g | INTRAVENOUS | Status: AC
  Administered 2022-07-21: 2 g via INTRAVENOUS

## 2022-07-21 MED ORDER — IRRISEPT - 450ML BOTTLE WITH 0.05% CHG IN STERILE WATER, USP 99.95% OPTIME
TOPICAL | Status: DC | PRN
  Administered 2022-07-21: 450 mL

## 2022-07-21 MED ORDER — PHENOL 1.4 % MT LIQD: 1.0000 | OROMUCOSAL | Status: DC | PRN

## 2022-07-21 MED ORDER — ONDANSETRON HCL 4 MG/2ML IJ SOLN
INTRAMUSCULAR | Status: DC | PRN
  Administered 2022-07-21: 4 mg via INTRAVENOUS

## 2022-07-21 MED ORDER — ASPIRIN EC 81 MG PO TBEC
81.0000 mg | DELAYED_RELEASE_TABLET | Freq: Every day | ORAL | Status: DC
  Administered 2022-07-21 – 2022-07-22 (×2): 81 mg via ORAL
  Filled 2022-07-21 (×2): qty 1

## 2022-07-21 MED ORDER — ACETAMINOPHEN 500 MG PO TABS
1000.0000 mg | ORAL_TABLET | Freq: Three times a day (TID) | ORAL | Status: AC
  Administered 2022-07-22: 1000 mg via ORAL

## 2022-07-21 MED ORDER — ONDANSETRON HCL 4 MG PO TABS: 4.0000 mg | ORAL_TABLET | Freq: Four times a day (QID) | ORAL | Status: DC | PRN

## 2022-07-21 MED ORDER — FENTANYL CITRATE (PF) 100 MCG/2ML IJ SOLN
INTRAMUSCULAR | Status: AC
  Filled 2022-07-21: qty 2

## 2022-07-21 MED ORDER — SODIUM CHLORIDE 0.9 % IR SOLN
Status: DC | PRN
  Administered 2022-07-21: 3000 mL

## 2022-07-21 MED ORDER — ATORVASTATIN CALCIUM 20 MG PO TABS
ORAL_TABLET | ORAL | Status: AC
  Administered 2022-07-21: 40 mg via ORAL
  Filled 2022-07-21: qty 2

## 2022-07-21 MED ORDER — SODIUM CHLORIDE (PF) 0.9 % IJ SOLN
INTRAMUSCULAR | Status: DC | PRN
  Administered 2022-07-21: 71 mL

## 2022-07-21 MED ORDER — TRANEXAMIC ACID-NACL 1000-0.7 MG/100ML-% IV SOLN
INTRAVENOUS | Status: AC
  Filled 2022-07-21: qty 100

## 2022-07-21 MED ORDER — CEFAZOLIN SODIUM-DEXTROSE 2-4 GM/100ML-% IV SOLN
INTRAVENOUS | Status: AC
  Administered 2022-07-21: 2 g via INTRAVENOUS
  Filled 2022-07-21: qty 100

## 2022-07-21 MED ORDER — TRANEXAMIC ACID 1000 MG/10ML IV SOLN
INTRAVENOUS | Status: AC
  Filled 2022-07-21: qty 10

## 2022-07-21 MED ORDER — METOCLOPRAMIDE HCL 10 MG PO TABS: 5.0000 mg | ORAL_TABLET | Freq: Three times a day (TID) | ORAL | Status: DC | PRN

## 2022-07-21 MED ORDER — ENOXAPARIN SODIUM 30 MG/0.3ML IJ SOSY: 30.0000 mg | PREFILLED_SYRINGE | Freq: Two times a day (BID) | INTRAMUSCULAR | Status: DC

## 2022-07-21 MED ORDER — BUPIVACAINE HCL (PF) 0.25 % IJ SOLN
INTRAMUSCULAR | Status: AC
  Filled 2022-07-21: qty 30

## 2022-07-21 MED ORDER — SODIUM CHLORIDE 0.9 % IV SOLN: INTRAVENOUS | Status: DC

## 2022-07-21 MED ORDER — METOCLOPRAMIDE HCL 5 MG/ML IJ SOLN: 5.0000 mg | Freq: Three times a day (TID) | INTRAMUSCULAR | Status: DC | PRN

## 2022-07-21 MED ORDER — DOCUSATE SODIUM 100 MG PO CAPS
ORAL_CAPSULE | ORAL | Status: AC
  Administered 2022-07-21: 100 mg via ORAL
  Filled 2022-07-21: qty 1

## 2022-07-21 MED ORDER — MENTHOL 3 MG MT LOZG: 1.0000 | LOZENGE | OROMUCOSAL | Status: DC | PRN

## 2022-07-21 MED ORDER — FAMOTIDINE 20 MG PO TABS
ORAL_TABLET | ORAL | Status: AC
  Administered 2022-07-21: 20 mg via ORAL
  Filled 2022-07-21: qty 1

## 2022-07-21 MED ORDER — ATENOLOL 50 MG PO TABS
25.0000 mg | ORAL_TABLET | Freq: Every day | ORAL | Status: DC
  Administered 2022-07-22: 25 mg via ORAL

## 2022-07-21 MED ORDER — ONDANSETRON HCL 4 MG/2ML IJ SOLN: 4.0000 mg | Freq: Four times a day (QID) | INTRAMUSCULAR | Status: DC | PRN

## 2022-07-21 MED ORDER — ORAL CARE MOUTH RINSE: 15.0000 mL | Freq: Once | OROMUCOSAL | Status: AC

## 2022-07-21 MED ORDER — LACTATED RINGERS IV SOLN: INTRAVENOUS | Status: DC

## 2022-07-21 MED ORDER — TRAMADOL HCL 50 MG PO TABS
ORAL_TABLET | ORAL | Status: AC
  Filled 2022-07-21: qty 1

## 2022-07-21 MED ORDER — FENTANYL CITRATE (PF) 100 MCG/2ML IJ SOLN: 25.0000 ug | INTRAMUSCULAR | Status: DC | PRN

## 2022-07-21 MED ORDER — PROPOFOL 500 MG/50ML IV EMUL
INTRAVENOUS | Status: DC | PRN
  Administered 2022-07-21: 100 ug/kg/min via INTRAVENOUS
  Administered 2022-07-21: 200 ug/kg/min via INTRAVENOUS

## 2022-07-21 MED ORDER — OXYCODONE HCL 5 MG/5ML PO SOLN: 5.0000 mg | Freq: Once | ORAL | Status: DC | PRN

## 2022-07-21 MED ORDER — BUPIVACAINE LIPOSOME 1.3 % IJ SUSP
INTRAMUSCULAR | Status: AC
  Filled 2022-07-21: qty 20

## 2022-07-21 SURGICAL SUPPLY — 83 items
ADH SKN CLS APL DERMABOND .7 (GAUZE/BANDAGES/DRESSINGS) ×1
APL PRP STRL LF DISP 70% ISPRP (MISCELLANEOUS) ×2
BLADE PATELLA REAM PILOT HOLE (MISCELLANEOUS) IMPLANT
BLADE SAW SAG 25X90X1.19 (BLADE) ×1 IMPLANT
BLADE SAW SAG 29X58X.64 (BLADE) ×1 IMPLANT
BNDG CMPR 5X62 HK CLSR LF (GAUZE/BANDAGES/DRESSINGS) ×1
BNDG ELASTIC 6INX 5YD STR LF (GAUZE/BANDAGES/DRESSINGS) ×1 IMPLANT
BOWL CEMENT MIX W/ADAPTER (MISCELLANEOUS) ×1 IMPLANT
BSPLAT TIB 5D D CMNT STM RT (Knees) ×1 IMPLANT
CEMENT BONE R 1X40 (Cement) ×2 IMPLANT
CHLORAPREP W/TINT 26 (MISCELLANEOUS) ×2 IMPLANT
COOLER POLAR GLACIER W/PUMP (MISCELLANEOUS) ×1 IMPLANT
CUFF TOURN SGL QUICK 24 (TOURNIQUET CUFF)
CUFF TOURN SGL QUICK 34 (TOURNIQUET CUFF)
CUFF TRNQT CYL 24X4X16.5-23 (TOURNIQUET CUFF) IMPLANT
CUFF TRNQT CYL 34X4.125X (TOURNIQUET CUFF) IMPLANT
DERMABOND ADVANCED .7 DNX12 (GAUZE/BANDAGES/DRESSINGS) ×1 IMPLANT
DRAPE 3/4 80X56 (DRAPES) ×1 IMPLANT
DRAPE INCISE IOBAN 66X60 STRL (DRAPES) ×1 IMPLANT
DRSG MEPILEX SACRM 8.7X9.8 (GAUZE/BANDAGES/DRESSINGS) ×1 IMPLANT
DRSG OPSITE POSTOP 4X10 (GAUZE/BANDAGES/DRESSINGS) IMPLANT
DRSG OPSITE POSTOP 4X8 (GAUZE/BANDAGES/DRESSINGS) IMPLANT
ELECT CAUTERY BLADE 6.4 (BLADE) ×1 IMPLANT
ELECT REM PT RETURN 9FT ADLT (ELECTROSURGICAL) ×1
ELECTRODE REM PT RTRN 9FT ADLT (ELECTROSURGICAL) ×1 IMPLANT
FEMUR CMT CR STD SZ 6 RT KNEE (Joint) ×1 IMPLANT
FEMUR CMTD CR STD SZ 6 RT KNEE (Joint) IMPLANT
GLOVE BIO SURGEON STRL SZ8 (GLOVE) ×1 IMPLANT
GLOVE BIOGEL PI IND STRL 8 (GLOVE) ×1 IMPLANT
GLOVE PI ORTHO PRO STRL 7.5 (GLOVE) ×2 IMPLANT
GLOVE PI ORTHO PRO STRL SZ8 (GLOVE) ×2 IMPLANT
GOWN STRL REUS W/ TWL LRG LVL3 (GOWN DISPOSABLE) ×1 IMPLANT
GOWN STRL REUS W/ TWL XL LVL3 (GOWN DISPOSABLE) ×2 IMPLANT
GOWN STRL REUS W/TWL LRG LVL3 (GOWN DISPOSABLE) ×1
GOWN STRL REUS W/TWL XL LVL3 (GOWN DISPOSABLE) ×2
HANDLE YANKAUER SUCT OPEN TIP (MISCELLANEOUS) ×1 IMPLANT
HDLS TROCR DRIL PIN KNEE 75 (PIN) ×5
HOLDER FOLEY CATH W/STRAP (MISCELLANEOUS) ×1 IMPLANT
HOOD PEEL AWAY T7 (MISCELLANEOUS) ×3 IMPLANT
INSERT TIB ASF PERS CD6-7 RT (Insert) IMPLANT
IV NS IRRIG 3000ML ARTHROMATIC (IV SOLUTION) ×1 IMPLANT
JET LAVAGE IRRISEPT WOUND (IRRIGATION / IRRIGATOR) ×1
KIT TURNOVER KIT A (KITS) ×1 IMPLANT
LAVAGE JET IRRISEPT WOUND (IRRIGATION / IRRIGATOR) IMPLANT
MANIFOLD NEPTUNE II (INSTRUMENTS) ×1 IMPLANT
MARKER SKIN DUAL TIP RULER LAB (MISCELLANEOUS) ×1 IMPLANT
MAT ABSORB  FLUID 56X50 GRAY (MISCELLANEOUS) ×1
MAT ABSORB FLUID 56X50 GRAY (MISCELLANEOUS) ×1 IMPLANT
NDL HYPO 21X1.5 SAFETY (NEEDLE) ×1 IMPLANT
NDL SAFETY ECLIP 18X1.5 (MISCELLANEOUS) ×1 IMPLANT
NEEDLE HYPO 21X1.5 SAFETY (NEEDLE) ×1 IMPLANT
NS IRRIG 500ML POUR BTL (IV SOLUTION) ×1 IMPLANT
PACK TOTAL KNEE (MISCELLANEOUS) ×1 IMPLANT
PAD WRAPON POLAR KNEE (MISCELLANEOUS) ×1 IMPLANT
PATELLA REAMING SYSTEM PATELLA REAMER BLADE WITH PILOT HOLE IMPLANT
PENCIL SMOKE EVACUATOR (MISCELLANEOUS) ×1 IMPLANT
PIN DRILL HDLS TROCAR 75 4PK (PIN) IMPLANT
PULSAVAC PLUS IRRIG FAN TIP (DISPOSABLE) ×1
SCREW FEMALE HEX FIX 25X2.5 (ORTHOPEDIC DISPOSABLE SUPPLIES) ×1 IMPLANT
SCREW HEX HEADED 3.5X27 DISP (ORTHOPEDIC DISPOSABLE SUPPLIES) IMPLANT
SLEEVE SCD COMPRESS KNEE MED (STOCKING) ×1 IMPLANT
SOLUTION IRRIG SURGIPHOR (IV SOLUTION) ×1 IMPLANT
STEM POLY PAT PLY 29M KNEE (Knees) IMPLANT
STEM TIB ST PERS 14+30 (Stem) IMPLANT
STEM TIBIA 5 DEG SZ D R KNEE (Knees) IMPLANT
SUT DVC 2 QUILL PDO  T11 36X36 (SUTURE) ×1
SUT DVC 2 QUILL PDO T11 36X36 (SUTURE) ×1 IMPLANT
SUT QUILL MONODERM 3-0 PS-2 (SUTURE) ×1 IMPLANT
SUT VIC AB 0 CT1 36 (SUTURE) ×1 IMPLANT
SUT VIC AB 2-0 CT2 27 (SUTURE) ×2 IMPLANT
SUT VICRYL 1-0 27IN ABS (SUTURE) ×1
SUTURE VICRYL 1-0 27IN ABS (SUTURE) ×1 IMPLANT
SYR 10ML LL (SYRINGE) ×1 IMPLANT
SYR 30ML LL (SYRINGE) ×2 IMPLANT
SYR 3ML LL SCALE MARK (SYRINGE) ×1 IMPLANT
TAPE CLOTH 3X10 WHT NS LF (GAUZE/BANDAGES/DRESSINGS) ×1 IMPLANT
TIBIA STEM 5 DEG SZ D R KNEE (Knees) ×1 IMPLANT
TIP FAN IRRIG PULSAVAC PLUS (DISPOSABLE) ×1 IMPLANT
TOWEL OR 17X26 4PK STRL BLUE (TOWEL DISPOSABLE) IMPLANT
TRAP FLUID SMOKE EVACUATOR (MISCELLANEOUS) ×1 IMPLANT
TRAY FOLEY SLVR 16FR LF STAT (SET/KITS/TRAYS/PACK) ×1 IMPLANT
WATER STERILE IRR 1000ML POUR (IV SOLUTION) ×1 IMPLANT
WRAPON POLAR PAD KNEE (MISCELLANEOUS) ×1

## 2022-07-21 NOTE — Transfer of Care (Signed)
Immediate Anesthesia Transfer of Care Note  Patient: April [name]  Procedure(s) Performed: TOTAL KNEE ARTHROPLASTY (Right: Knee)  Patient Location: PACU  Anesthesia Type:General  Level of Consciousness: awake, alert , and oriented  Airway & Oxygen Therapy: Patient Spontanous Breathing  Post-op Assessment: Report given to RN and Post -op Vital signs reviewed and stable  Post vital signs: stable  Last Vitals:  Vitals Value Taken Time  BP 147/66 07/21/22 0936  Temp    Pulse 63 07/21/22 0938  Resp 15 07/21/22 0938  SpO2 99 % 07/21/22 0938  Vitals shown include unvalidated device data.  Last Pain:  Vitals:   07/21/22 0618  TempSrc: Temporal  PainSc: 0-No pain         Complications: No notable events documented.

## 2022-07-21 NOTE — Evaluation (Signed)
Physical Therapy Evaluation Patient Details Name: April [name] MRN: [id number] DOB: Jan 19, 1946 Today's Date: 07/21/2022  History of Present Illness  Pt is a 77 y/o F admitted on 07/21/22 for scheduled R TKA. PMH: adhesive capsulitis, CAD, diverticulosis, HLD, HTN, MI, PE, urinary incontinence  Clinical Impression  Pt seen for PT evaluation with pt agreeable to tx. Provided pt with HEP handout & pt performed RLE exercises with PRN cuing for technique. Pt is able to complete bed mobility with supervision, STS with CGA & gait with RW & CGA fade to supervision. Pt tolerates session well. Anticipate pt will be able to practice stair negotiation tomorrow.   Recommendations for follow up therapy are one component of a multi-disciplinary discharge planning process, led by the attending physician.  Recommendations may be updated based on patient status, additional functional criteria and insurance authorization.  Follow Up Recommendations       Assistance Recommended at Discharge Intermittent Supervision/Assistance  Patient can return home with the following  A little help with walking and/or transfers;Assistance with cooking/housework;A little help with bathing/dressing/bathroom;Assist for transportation;Help with stairs or ramp for entrance    Equipment Recommendations Rolling walker (2 wheels)  Recommendations for Other Services       Functional Status Assessment Patient has had a recent decline in their functional status and demonstrates the ability to make significant improvements in function in a reasonable and predictable amount of time.     Precautions / Restrictions Precautions Precautions: Fall Restrictions Weight Bearing Restrictions: Yes RLE Weight Bearing: Weight bearing as tolerated      Mobility  Bed Mobility Overal bed mobility: Needs Assistance Bed Mobility: Supine to Sit     Supine to sit: Supervision, HOB elevated     General bed mobility comments: use of  bed rails PRN    Transfers Overall transfer level: Needs assistance Equipment used: Rolling walker (2 wheels) Transfers: Sit to/from Stand Sit to Stand: Min guard           General transfer comment: education re: hand placement during STS    Ambulation/Gait Ambulation/Gait assistance: Min guard, Supervision Gait Distance (Feet): 40 Feet (+ 40 ft) Assistive device: Rolling walker (2 wheels) Gait Pattern/deviations: Step-through pattern, Decreased step length - right, Decreased step length - left, Decreased dorsiflexion - right, Decreased stride length, Decreased weight shift to right Gait velocity: decreased     General Gait Details: Pt ambulates to bathroom & back, initially requiring CGA, fade to supervision. PT provides education re: standing in center of walker & more fluid gait pattern as well as forward vs downward gaze.  Stairs            Wheelchair Mobility    Modified Rankin (Stroke Patients Only)       Balance Overall balance assessment: Needs assistance Sitting-balance support: Feet supported Sitting balance-Leahy Scale: Good     Standing balance support: During functional activity, No upper extremity supported Standing balance-Leahy Scale: Fair Standing balance comment: close supervision static sitting to perform hand hygiene at sink                             Pertinent Vitals/Pain Pain Assessment Pain Assessment: 0-10 Pain Score: 4  Pain Location: R knee Pain Descriptors / Indicators: Discomfort Pain Intervention(s): Monitored during session, Repositioned, Ice applied (polar care donned at beginning & end of session.)    Home Living Family/patient expects to be discharged to:: Private residence Living Arrangements: Spouse/significant other (significant  other April [name])) Available Help at Discharge: Family Type of Home: House Home Access: Stairs to enter;Other (comment) (has a lift, doesn't have to negotiate stairs) Entrance  Stairs-Rails: Right;Left Entrance Stairs-Number of Steps: 6   Home Layout: One level 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      Prior Function Prior Level of Function : Independent/Modified Independent             Mobility Comments: Pt reports she's mod I with a "stick", denies falls       Hand Dominance        Extremity/Trunk Assessment   Upper Extremity Assessment Upper Extremity Assessment: Overall WFL for tasks assessed    Lower Extremity Assessment Lower Extremity Assessment: RLE deficits/detail RLE Deficits / Details: 3-/5 knee extension in sitting       Communication   Communication: No difficulties  Cognition Arousal/Alertness: Awake/alert Behavior During Therapy: WFL for tasks assessed/performed Overall Cognitive Status: Within Functional Limits for tasks assessed                                          General Comments General comments (skin integrity, edema, etc.): Pt with continent void, performed peri hygiene without assistance.    Exercises Total Joint Exercises Ankle Circles/Pumps: AROM, Supine, Right, 10 reps Quad Sets: Supine, AROM, Strengthening, Right, 10 reps Short Arc Quad: AROM, Supine, Strengthening, Right, 10 reps Heel Slides: AROM, Supine, Strengthening, Right, 10 reps Hip ABduction/ADduction: AROM, Supine, Strengthening, Right, 10 reps Straight Leg Raises: AROM, Supine, Strengthening, Right, 10 reps Long Arc Quad: AROM, Strengthening, Right, 10 reps, Seated Knee Flexion: AROM, Seated, Right, 10 reps Goniometric ROM: ~0 degrees extension, ~85 degrees knee flexion in sitting   Assessment/Plan    PT Assessment Patient needs continued PT services  PT Problem List Decreased strength;Pain;Decreased range of motion;Decreased activity tolerance;Decreased balance;Decreased mobility;Decreased safety awareness;Decreased knowledge of use of DME;Decreased knowledge of precautions       PT Treatment Interventions DME  instruction;Therapeutic exercise;Gait training;Balance training;Stair training;Neuromuscular re-education;Functional mobility training;Therapeutic activities;Patient/family education;Manual techniques;Modalities    PT Goals (Current goals can be found in the Care Plan section)  Acute Rehab PT Goals Patient Stated Goal: get better PT Goal Formulation: With patient Time For Goal Achievement: 08/04/22 Potential to Achieve Goals: Good    Frequency BID     Co-evaluation               AM-PAC PT "6 Clicks" Mobility  Outcome Measure Help needed turning from your back to your side while in a flat bed without using bedrails?: None Help needed moving from lying on your back to sitting on the side of a flat bed without using bedrails?: A Little Help needed moving to and from a bed to a chair (including a wheelchair)?: A Little Help needed standing up from a chair using your arms (e.g., wheelchair or bedside chair)?: A Little Help needed to walk in hospital room?: A Little Help needed climbing 3-5 steps with a railing? : A Little 6 Click Score: 19    End of Session   Activity Tolerance: Patient tolerated treatment well Patient left: in chair;with call bell/phone within reach;with family/visitor present (RLE polar care donned)   PT Visit Diagnosis: Pain;Other abnormalities of gait and mobility (R26.89);Difficulty in walking, not elsewhere classified (R26.2);Muscle weakness (generalized) (M62.81) Pain - Right/Left: Right Pain - part of body: Knee    Time: [id number] PT Time Calculation (  min) (ACUTE ONLY): 26 min   Charges:   PT Evaluation $PT Eval Low Complexity: 1 Low PT Treatments $Therapeutic Exercise: 8-22 mins        Lavone Nian, PT, DPT 07/21/22, 2:49 PM   Waunita Schooner 07/21/2022, 2:48 PM

## 2022-07-21 NOTE — Interval H&P Note (Signed)
Patient history and physical updated. Consent reviewed including risks, benefits, and alternatives to surgery. Patient agrees with above plan to proceed with right total knee arthroplasty  

## 2022-07-21 NOTE — Anesthesia Preprocedure Evaluation (Signed)
Anesthesia Evaluation  Patient identified by MRN, date of birth, ID band Patient awake    Reviewed: Allergy & Precautions, NPO status , Patient's Chart, lab work & pertinent test results  History of Anesthesia Complications Negative for: history of anesthetic complications  Airway Mallampati: IV  TM Distance: >3 FB Neck ROM: full    Dental no notable dental hx.    Pulmonary neg pulmonary ROS   Pulmonary exam normal        Cardiovascular hypertension, On Medications + CAD, + Past MI and + Cardiac Stents  Normal cardiovascular exam  ECHO 06/03/22 Summary   1. The left ventricle is normal in size with mildly increased wall  thickness.   2. The left ventricular systolic function is normal, LVEF is visually  estimated at 60-65%.    3. The aortic valve is trileaflet with mildly thickened leaflets with normal  excursion.   4. There is mild aortic regurgitation.    5. The right ventricle is normal in size, with normal systolic function.   EKG 06/2022: SB     Neuro/Psych negative neurological ROS  negative psych ROS   GI/Hepatic negative GI ROS, Neg liver ROS,,,  Endo/Other  negative endocrine ROS    Renal/GU      Musculoskeletal   Abdominal   Peds  Hematology negative hematology ROS (+) Hx of PE, not on AC   Anesthesia Other Findings Past Medical History: No date: Cardiac abnormality     Comment:  STENT No date: Cataract No date: Coronary artery disease No date: Diverticula of colon No date: Endometriosis No date: History of shingles 2017: Hx of blood clots     Comment:  [facility] No date: Kidney stone No date: Myocardial infarction [facility])     Comment:  2006 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, 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  [name], MD 2016: Pulmonary embolism ([place]) 2016: Pulmonary embolism ([place]) 1990: Vertigo     Comment:  "inner ear"  Past Surgical  History: No date: ABDOMINAL HYSTERECTOMY No date: APPENDECTOMY 04/30/2020: BREAST BIOPSY; Right     Comment:  11:00 5cmfn Qmarker-benign, 11:00 8 cmfn twisted X               marker-benign 2005: CARDIAC CATHETERIZATION 02/08/2019: CATARACT EXTRACTION W/PHACO; Left     Comment:  Procedure: CATARACT EXTRACTION PHACO AND INTRAOCULAR               LENS PLACEMENT (IOC) LEFT 00:40.0  20.6%  8.26;  Surgeon:              Birder Robson, MD;  Location: [facility];                Service: Ophthalmology;  Laterality: Left; 04/26/2019: CATARACT EXTRACTION W/PHACO; Right     Comment:  Procedure: CATARACT EXTRACTION PHACO AND INTRAOCULAR               LENS PLACEMENT (IOC) RIGHT 6.83 00:46.0;  Surgeon:               Birder Robson, MD;  Location[place];                Service: Ophthalmology;  Laterality: Right; No date: CHOLECYSTECTOMY 2017: COLONOSCOPY No date: FOOT SURGERY; Right 07/08/2022: IVC FILTER INSERTION; N/A     Comment:  Procedure: IVC FILTER INSERTION;  Surgeon: Delana Meyer,  Dolores Lory, MD;  Location: [facility];  Service:              Cardiovascular;  Laterality: N/A;  BMI    Body Mass Index: 29.65 kg/m      Reproductive/Obstetrics negative OB ROS                             Anesthesia Physical Anesthesia Plan  ASA: 2  Anesthesia Plan: Spinal   Post-op Pain Management: Toradol IV (intra-op)* and Ofirmev IV (intra-op)*   Induction: Intravenous  PONV Risk Score and Plan: Propofol infusion, TIVA and Treatment may vary due to age or medical condition  Airway Management Planned: Natural Airway and Nasal Cannula  Additional Equipment:   Intra-op Plan:   Post-operative Plan:   Informed Consent: I have reviewed the patients History and Physical, chart, labs and discussed the procedure including the risks, benefits and alternatives for the proposed anesthesia with the patient or authorized  representative who has indicated his/her understanding and acceptance.     Dental Advisory Given  Plan Discussed with: Anesthesiologist, CRNA and Surgeon  Anesthesia Plan Comments: (Patient consented for risks of anesthesia including but not limited to:  - adverse reactions to medications - risk of airway placement if required - damage to eyes, teeth, lips or other oral mucosa - nerve damage due to positioning  - sore throat or hoarseness - Damage to heart, brain, nerves, lungs, other parts of body or loss of life  Patient voiced understanding.)        Anesthesia Quick Evaluation

## 2022-07-21 NOTE — Op Note (Signed)
Patient Name: April [name]  [id number]  Pre-Operative Diagnosis: Right knee Osteoarthritis  Post-Operative Diagnosis: (same)  Procedure: Right Total Knee Arthroplasty  Components/Implants: Femur: Persona Size 6 CR   Tibia: Persona Size D w/ 14x2mm stem extension  Poly: 74mm MC  Patella: 29x77mm symmetric cemented  Femoral Valgus Cut Angle: 5 degrees  Distal Femoral Re-cut: none  Patella Resurfacing: yes   Date of Surgery: 07/21/2022  Surgeon: Steffanie Rainwater MD  Assistant: Dorise Hiss PA (present and scrubbed throughout the case, critical for assistance with exposure, retraction, instrumentation, and closure)   Anesthesiologist: Danne Baxter  Anesthesia: Spinal  Tourniquet Time: 54 min  EBL: 25cc  IVF: [id number]  Complications: None   Brief history: The patient is a 77 year old female with a history of osteoarthritis of the right knee with pain limiting their range of motion and activities of daily living, which has failed multiple attempts at conservative therapy.  The risks and benefits of total knee arthroplasty as definitive surgical treatment were discussed with the patient, who opted to proceed with the operation.  After outpatient medical clearance and optimization was completed the patient was admitted to [facility] for the procedure.  All preoperative films were reviewed and an appropriate surgical plan was made prior to surgery. Preoperative range of motion was 0 to 120. The patient was identified as having a varus alignment.   Description of procedure: The patient was brought to the operating room where laterality was confirmed by all those present to be the right side.   Spinal anesthesia was administered and the patient received an intravenous dose of antibiotics for surgical prophylaxis, TXA by IV was withheld due to history of PE to be applied topically at the end of the surgery instead.  Patient is positioned supine on the operating room  table with all bony prominences well-padded.  A well-padded tourniquet was applied to the ft/right thigh.  The knee was then prepped and draped in usual sterile fashion with multiple layers of adhesive and nonadhesive drapes.  All of those present in the operating room participated in a surgical timeout laterality and patient were confirmed.   An Esmarch was wrapped around the extremity and the leg was elevated and the knee flexed.  The tourniquet was inflated to a pressure of 275 mmHg. The Esmarch was removed and the leg was brought down to full extension.  The patella and tibial tubercle identified and outlined using a marking pen and a midline skin incision was made with a knife carried through the subcutaneous tissue down to the extensor retinaculum.  After exposure of the extensor mechanism the medial parapatellar arthrotomy was performed with a scalpel and electrocautery extending down medial and distal to the tibial tubercle taking care to avoid incising the patellar tendon.   A standard medial release was performed over the proximal tibia.  The knee was brought into extension in order to excise the fat pad taking care not to damage the patella tendon.  The superior soft tissue was removed from the anterior surface of the distal femur to visualize for the procedure.  The knee was then brought into flexion with the patella subluxed laterally and subluxing the tibia anteriorly.  The ACL was transected and removed with electrocautery and additional soft tissue was removed from the proximal surface of the tibia to fully expose. The PCL was found to be intact and was preserved.  An extramedullary tibial cutting guide was then applied to the leg with a spring-loaded ankle clamp placed around  the distal tibia just above the malleoli the angulation of the guide was adjusted to give some posterior slope in the tibial resection with an appropriate varus/valgus alignment.  The resection guide was then pinned to  the proximal tibia and the proximal tibial surface was resected with an oscillating saw.  Careful attention was paid to ensure the blade did not disrupt any of the soft tissues including any lateral or medial ligament.  Attention was then turned to the femur, with the knee slightly flexed a opening drill was used to enter the medullary canal of the femur.  After removing the drill marrow was suctioned out to decompress the distal femur.  An intramedullary femoral guide was then inserted into the drill hole and the alignment guide was seated firmly against the distal end of the medial femoral condyle.  The distal femoral cutting guide was then attached and pinned securely to the anterior surface of the femur and the intramedullary rod and alignment guide was removed.  Distal femur resection was then performed with an oscillating saw with retractors protecting medial and laterally.   The distal cutting block was then removed and the extension gap was checked with a spacer.  Extension gap was found to be appropriately sized to accommodate the spacer block.   The femoral sizing guide was then placed securely into the posterior condyles of the femur and the femoral size was measured and determined to be 6.  The size 6; 4-in-1 cutting guide was placed in position and secured with 2 pins.  The anterior posterior and chamfer resections were then performed with an oscillating saw.  Bony fragments and osteophytes were then removed.  Using a lamina spreader the posterior medial and lateral condyles were checked for additional osteophytes and posterior soft tissue remnants.  Any remaining meniscus was removed at this time.  Periarticular injection was performed in the meniscal rims and posterior capsule with aspiration performed to ensure no intravascular injection.   The tibia was then exposed and the tibial trial was pinned onto the plateau after confirming appropriate orientation and rotation.  Using the drill  bushing the tibia was prepared to the appropriate drill depth.  Tibial broach impactor was then driven through the punch guide using a mallet.  The femoral trial component was then inserted onto the femur. A trial tibial polyethylene bearing was then placed and the knee was reduced.  The knee achieved full extension with no hyperextension and was found to be balanced in flexion and extension with the trials in place.  The knee was then brought into full extension the patella was everted and held with 2 Kocher clamps.  The articular surface of the patella was then resected with an patella reamer and saw after careful measurement with a caliper.  The patella was then prepared with the drill guide and a trial patella was placed.  The knee was then taken through range of motion and it was found that the patella articulated appropriately with the trochlea and good patellofemoral motion without subluxation.    The correct final components for implantation were confirmed and opened by the circulator nurse.  The prepared surfaces of the patella femur and tibia were cleaned with pulsatile lavage to remove all blood fat and other material and then the surfaces were dried.  2 bags of cement were mixed under vacuum and the components were cemented into place.  Excess cement was removed with curettes and forceps. A trial polyethylene tibial component was placed and the knee  was brought into extension to allow the cement to set.  At this time the periarticular injection cocktail was placed in the soft tissues surrounding the knee.  After full curing of the cement the balance of the knee was checked again and the final polyethylene size was confirmed. The tibial component was irrigated and locking mechanism checked to ensure it was clear of debris. The real polyethylene tibial component was implanted and the knee was brought through a range of motion.   The knee was then irrigated with copious amount of normal saline via  pulsatile lavage to remove all loose bodies and other debris.  The knee was then irrigated with Irrisept wash and reirrigated with saline.  The tourniquet was then dropped and all bleeding vessels were identified and coagulated.  The arthrotomy was approximated with #1 Vicryl and closed with #2 Quill suture. The joint was then injected with TXA with care taken to ensure no vascular injection. The knee was brought into slight flexion and the subcutaneous tissues were closed with 0 Vicryl, 2-0 Vicryl and a running subcuticular 3-0 monoderm quil suture.  Skin was then glued with Dermabond.  A sterile adhesive dressing was then placed along with a sequential compression device to the calf, a Ted stocking, and a cryotherapy cuff.   Sponge, needle, and Lap counts were all correct at the end of the case.   The patient was transferred off of the operating room table to a hospital bed, good pulses were found distally on the operative side.  The patient was transferred to the recovery room in stable condition.

## 2022-07-21 NOTE — H&P (Signed)
Chief Complaint  Patient presents with  Pre-op Exam  Right TKA scheduled 07/21/22 with Dr. Royetta Asal April [name] is a 77 y.o. female who presents today for history and physical for right total knee arthroplasty with Dr. Karel Jarvis on 07/21/2022. Patient has complete loss of joint space in the medial compartment of the right knee with tricompartmental arthritic changes. Patient's pain is severe and is interfering with her quality life and activities daily living. Patient said pain for greater than 2 years with 6 months of severe increasing pain along the medial joint line of the right knee. Pain is 10 out of 10 and she describes a catching sensation with activity. She has had cortisone injections and gel injections as well as Pez anserine bursa injections with little relief. Patient had MRI showing advanced arthritic changes throughout the right knee. She has been using a knee sleeve with little to no relief. Patient has seen Dr. Karel Jarvis, discussed total knee arthroplasty and agreed and consented the procedure. Patient has a history of pulmonary embolism treated with Eliquis for 6 months in 2016 after having a viral infection but had no other inciting cause. IVC filter has been placed by vascular prior to total knee arthroplasty  The patient is a non-smoker with a BMI of 29.9 her last hemoglobin A1c this year was 6.3  Past Medical History: Past Medical History:  Diagnosis Date  Adhesive capsulitis  history of  Anemia  Bunion  Coronary artery disease  Diverticulosis 03/06/2014  Sigmoid, Descending, Transverse and the Ascending Colon  History of chicken pox  Hyperlipidemia  Hypertension  Menopausal syndrome  Myocardial infarction (CMS-HCC)  Pulmonary embolism (CMS-HCC) 03/2015  Urinary incontinence, mixed   Past Surgical History: Past Surgical History:  Procedure Laterality Date  EGD 02/15/1999  Chronic gastritits  coronary stenting 02/21/2005  one stent placed, followed by Dr. Mohammed Kindle [place]  COLONOSCOPY 03/06/2014  [place],TC,AC/Repeat 61yrs/PYO  COLONOSCOPY 06/14/2020  Diverticulosis/Otherwise normal colon/Repeat 50yrs/CTL  APPENDECTOMY  CHOLECYSTECTOMY  for gallstones  COLONOSCOPY 11/16/98; 03/07/04  Diverticulosis  HALLUX VALGUS CORRECTION Bilateral  HYSTERECTOMY  TAH/BSO   Past Family History: Family History  Problem Relation Age of Onset  Stroke Mother  Diabetes Father  Myocardial Infarction (Heart attack) Father  Asthma Other  Siblings   Medications: Current Outpatient Medications Ordered in Epic  Medication Sig Dispense Refill  acetaminophen (TYLENOL) 500 MG tablet Take 500 mg by mouth every 8 (eight) hours as needed  aspirin 81 MG EC tablet Take 81 mg by mouth.  atenoloL (TENORMIN) 25 MG tablet Take 1 tablet (25 mg total) by mouth once daily 90 tablet 3  atorvastatin (LIPITOR) 40 MG tablet TAKE 1 TABLET BY MOUTH AT BEDTIME 90 tablet 3  calcium citrate-vitamin D3 (CITRACAL+D) 315-200 mg-unit tablet Take 1 tablet by mouth 2 (two) times daily with meals.  cholecalciferol (VITAMIN D3) 2,000 unit capsule Take 2,000 Units by mouth once daily  EPINEPHrine (EPIPEN) 0.3 mg/0.3 mL auto-injector Inject 0.3 mLs (0.3 mg total) into the muscle as needed 1 pen! 0  tobramycin-dexAMETHasone (TOBRADEX) 0.3-0.1 % ophthalmic suspension  TURMERIC ORAL Take 1 capsule by mouth 2 (two) times daily   No current Epic-ordered facility-administered medications on file.   Allergies: Allergies  Allergen Reactions  Antihistamine [Clemastine-Phenylprop] Unknown  Ciprofloxacin Unknown  Other reaction(s): Other (See Comments) Patient cannot remember reaction  Dristan [Cpm-Phenyleph-Acetaminophen] Unknown  Ginger Diarrhea  Macrobid [Nitrofurantoin Monohyd/M-Cryst] Nausea  Pheniramine Unknown  Other reaction(s): Other (See Comments) "think crazy thoughts"  Other Anxiety  DRISTAN    Review of Systems:  A comprehensive 14 point ROS was performed, reviewed by  me today, and the pertinent orthopaedic findings are documented in the HPI.  Exam: BP 104/60  Ht 170.2 cm (5\' 7" )  Wt 85.4 kg (188 lb 3.2 oz)  BMI 29.48 kg/m  General:  Well developed, well nourished, no apparent distress, normal affect, antalgic gait with no antalgic component.   HEENT: Head normocephalic, atraumatic, PERRL.   Abdomen: Soft, non tender, non distended, Bowel sounds present.  Heart: Examination of the heart reveals regular, rate, and rhythm. There is no murmur noted on ascultation. There is a normal apical pulse.  Lungs: Lungs are clear to auscultation. There is no wheeze, rhonchi, or crackles. There is normal expansion of bilateral chest walls.   Comprehensive Knee Exam: Gait Non-antalgic and fluid  Alignment Neutral   Inspection Right Left  Skin Normal appearance with no obvious deformity. No ecchymosis or erythema. Normal appearance with no obvious deformity. No ecchymosis or erythema.  Soft Tissue No focal soft tissue swelling No focal soft tissue swelling  Quad Atrophy None None   Palpation  Right Left  Tenderness Medial joint line tenderness and Patellar tenderness to palpation No peripatellar, patellar tendon, quad tendon, medial/lateral joint line pain  Crepitus + patellofemoral and tibiofemoral crepitus No patellofemoral or tibiofemoral crepitus  Effusion None None   Range of Motion              Right        Left  Flexion 0-120    Normal AROM  Extension Full knee extension without hyperextension Full knee extension without hyperextension   Ligamentous Exam Right Left  Lachman Normal Normal  Valgus 0 Normal Normal  Valgus 30 Normal Normal  Varus 0 Normal Normal  Varus 30 Normal Normal  Anterior Drawer Normal Normal  Posterior Drawer Normal Normal   Meniscal Exam Right Left  Hyperflexion Test Positive Negative  Hyperextension Test Positive Negative  McMurray's Positive Negative   Neurovascular Right Left  Quadriceps Strength 5/5  5/5  Hamstring Strength 5/5 5/5  Hip Abductor Strength 4/5 4/5  Distal Motor Normal Normal  Distal Sensory Normal light touch sensation Normal light touch sensation  Distal Pulses Normal Normal    Imaging Studies: I reviewed PA flexion x-ray bilateral knee taken on 03/28/2022. There is significant medial joint space narrowing with osteophyte formation and near bone-on-bone articulation in the right knee. The left knee also has some medial joint space narrowing with sclerosis and small medial osteophyte formation.  I reviewed 4 view x-rays of the right knee taken on 01/03/2022 images reviewed by myself showing medial joint space narrowing with osteophyte formation, sclerosis, subchondral cyst formation as well as bone-on-bone articulation in the patellofemoral joint most severely laterally with osteophyte formation sclerosis bone-on-bone articulation. There are also several calcified loose bodies noted in the posterior knee on lateral x-ray.  MRI of the right knee performed on 04/12/2022 images and report reviewed by myself. There is full-thickness cartilage loss of the entire medial patellofemoral compartment with partial loss in the lateral patellofemoral compartment. The medial femorotibial compartment has full-thickness cartilage loss with subchondral marrow edema and osteophytes. The lateral compartment has cartilage irregularity with what appears to be some partial and even some full-thickness defects. There is some degenerative tearing most notably in the posterior horn of the medial meniscus with some meniscal extrusion. Significant tricompartmental arthritis with a medial meniscus tear.  Impression: Osteoarthritis of right knee, unspecified osteoarthritis type [M17.11] Osteoarthritis  of right knee, unspecified osteoarthritis type (primary encounter diagnosis)  Plan:  58. 77 year old female with advanced right knee degenerative arthritis with complete loss of joint space in the medial  compartment with subchondral marrow edema and large osteophytes present. Patient has failed all forms of conservative treatment with the right knee and her pain is interfering with her quality life and activities daily living. Risks, benefits, complications of a right total knee arthroplasty have been discussed with the patient. Patient has agreed and consented procedure with Dr. Karel Jarvis on 07/21/2022. Patient has seen vascular and IVC filter has been placed.  The hospitalization and post-operative care and rehabilitation were also discussed. The use of perioperative antibiotics and DVT prophylaxis were discussed. The risk, benefits and alternatives to a surgical intervention were discussed at length with the patient. The patient was also advised of risks related to the medical comorbidities and elevated body mass index (BMI). A lengthy discussion took place to review the most common complications including but not limited to: stiffness, loss of function, complex regional pain syndrome, deep vein thrombosis, pulmonary embolus, heart attack, stroke, infection, wound breakdown, numbness, damage to nerves, tendon,muscles, arteries or other blood vessels, death and other possible complications from anesthesia. The patient was told that we will take steps to minimize these risks by using sterile technique, antibiotics and DVT prophylaxis when appropriate and follow the patient postoperatively in the office setting to monitor progress. The possibility of recurrent pain, no improvement in pain and actual worsening of pain were also discussed with the patient.   This note was generated in part with voice recognition software and I apologize for any typographical errors that were not detected and corrected.

## 2022-07-22 ENCOUNTER — Encounter: Payer: Self-pay | Admitting: Orthopedic Surgery

## 2022-07-22 DIAGNOSIS — M1711 Unilateral primary osteoarthritis, right knee: Secondary | ICD-10-CM | POA: Diagnosis not present

## 2022-07-22 DIAGNOSIS — M129 Arthropathy, unspecified: Secondary | ICD-10-CM | POA: Diagnosis not present

## 2022-07-22 DIAGNOSIS — R531 Weakness: Secondary | ICD-10-CM | POA: Diagnosis not present

## 2022-07-22 LAB — BASIC METABOLIC PANEL
Anion gap: 4 — ABNORMAL LOW (ref 5–15)
BUN: 31 mg/dL — ABNORMAL HIGH (ref 8–23)
CO2: 22 mmol/L (ref 22–32)
Calcium: 8.4 mg/dL — ABNORMAL LOW (ref 8.9–10.3)
Chloride: 109 mmol/L (ref 98–111)
Creatinine, Ser: 1.09 mg/dL — ABNORMAL HIGH (ref 0.44–1.00)
GFR, Estimated: 52 mL/min — ABNORMAL LOW (ref >60)
Glucose, Bld: 143 mg/dL — ABNORMAL HIGH (ref 70–99)
Potassium: 4.3 mmol/L (ref 3.5–5.1)
Sodium: 135 mmol/L (ref 135–145)

## 2022-07-22 LAB — CBC
HCT: 33 % — ABNORMAL LOW (ref 36.0–46.0)
Hemoglobin: 10.9 g/dL — ABNORMAL LOW (ref 12.0–15.0)
MCH: 30.6 pg (ref 26.0–34.0)
MCHC: 33 g/dL (ref 30.0–36.0)
MCV: 92.7 fL (ref 80.0–100.0)
Platelets: 229 10*3/uL (ref 150–400)
RBC: 3.56 MIL/uL — ABNORMAL LOW (ref 3.87–5.11)
RDW: 12.3 % (ref 11.5–15.5)
WBC: 11.2 10*3/uL — ABNORMAL HIGH (ref 4.0–10.5)
nRBC: 0 % (ref 0.0–0.2)

## 2022-07-22 MED ORDER — ACETAMINOPHEN 500 MG PO TABS
ORAL_TABLET | ORAL | Status: AC
  Administered 2022-07-22: 1000 mg via ORAL
  Filled 2022-07-22: qty 2

## 2022-07-22 MED ORDER — ONDANSETRON HCL 4 MG PO TABS: 4.0000 mg | ORAL_TABLET | Freq: Four times a day (QID) | ORAL | 0 refills | Status: DC | PRN

## 2022-07-22 MED ORDER — ACETAMINOPHEN 500 MG PO TABS
ORAL_TABLET | ORAL | Status: AC
  Filled 2022-07-22: qty 2

## 2022-07-22 MED ORDER — CELECOXIB 100 MG PO CAPS: 100.0000 mg | ORAL_CAPSULE | Freq: Two times a day (BID) | ORAL | 0 refills | Status: AC

## 2022-07-22 MED ORDER — KETOROLAC TROMETHAMINE 15 MG/ML IJ SOLN
INTRAMUSCULAR | Status: AC
  Administered 2022-07-22: 7.5 mg via INTRAVENOUS
  Filled 2022-07-22: qty 1

## 2022-07-22 MED ORDER — ENOXAPARIN SODIUM 30 MG/0.3ML IJ SOSY
PREFILLED_SYRINGE | INTRAMUSCULAR | Status: AC
  Administered 2022-07-22: 30 mg via SUBCUTANEOUS
  Filled 2022-07-22: qty 0.3

## 2022-07-22 MED ORDER — TRAMADOL HCL 50 MG PO TABS: 50.0000 mg | ORAL_TABLET | Freq: Four times a day (QID) | ORAL | 0 refills | Status: DC | PRN

## 2022-07-22 MED ORDER — ENOXAPARIN SODIUM 40 MG/0.4ML IJ SOSY: 40.0000 mg | PREFILLED_SYRINGE | INTRAMUSCULAR | 0 refills | Status: DC

## 2022-07-22 MED ORDER — PANTOPRAZOLE SODIUM 40 MG PO TBEC
DELAYED_RELEASE_TABLET | ORAL | Status: AC
  Administered 2022-07-22: 40 mg via ORAL
  Filled 2022-07-22: qty 1

## 2022-07-22 MED ORDER — ACETAMINOPHEN 500 MG PO TABS: 1000.0000 mg | ORAL_TABLET | Freq: Three times a day (TID) | ORAL | 0 refills | Status: AC

## 2022-07-22 MED ORDER — ASPIRIN 81 MG PO TBEC: 81.0000 mg | DELAYED_RELEASE_TABLET | Freq: Two times a day (BID) | ORAL | 0 refills | Status: AC

## 2022-07-22 MED ORDER — DOCUSATE SODIUM 100 MG PO CAPS: 100.0000 mg | ORAL_CAPSULE | Freq: Two times a day (BID) | ORAL | 0 refills | Status: DC

## 2022-07-22 MED ORDER — ATENOLOL 50 MG PO TABS
ORAL_TABLET | ORAL | Status: AC
  Filled 2022-07-22: qty 1

## 2022-07-22 MED ORDER — DOCUSATE SODIUM 100 MG PO CAPS
ORAL_CAPSULE | ORAL | Status: AC
  Administered 2022-07-22: 100 mg via ORAL
  Filled 2022-07-22: qty 1

## 2022-07-22 NOTE — Discharge Instructions (Addendum)
Instructions after Total Knee Replacement   April [name] M.D.     Dept. of [facility]  [place], [place]  [address]  Phone: [phone]   Fax: [phone]    DIET: Drink plenty of non-alcoholic fluids. Resume your normal diet. Include foods high in fiber.  ACTIVITY:  You may use crutches or a walker with weight-bearing as tolerated, unless instructed otherwise. You may be weaned off of the walker or crutches by your Physical Therapist.  Do NOT place pillows under the knee. Anything placed under the knee could limit your ability to straighten the knee.   Continue doing gentle exercises. Exercising will reduce the pain and swelling, increase motion, and prevent muscle weakness.   Please continue to use the TED compression stockings for 2 weeks. You may remove the stockings at night, but should reapply them in the morning. Do not drive or operate any equipment until instructed.  WOUND CARE:  Continue to use the PolarCare or ice packs periodically to reduce pain and swelling. You may begin showering 3 days after surgery with honeycomb dressing. Remove honeycomb dressing 7 days after surgery and continue showering. Allow dermabond to fall off on its own.  MEDICATIONS: You may resume your regular medications. Please take the pain medication as prescribed on the medication. Do not take pain medication on an empty stomach. You have been given a prescription for a blood thinner (Lovenox or Coumadin). Please take the medication as instructed. (NOTE: After completing a 3 week course of Lovenox, take one 81 mg Enteric-coated aspirin twice a day for 2 additional weeks. This along with elevation will help reduce the possibility of phlebitis in your operated leg.) Do not drive or drink alcoholic beverages when taking pain medications.  POSTOPERATIVE CONSTIPATION PROTOCOL Constipation - defined medically as fewer than three stools per  week and severe constipation as less than one stool per week.  One of the most common issues patients have following surgery is constipation.  Even if you have a regular bowel pattern at home, your normal regimen is likely to be disrupted due to multiple reasons following surgery.  Combination of anesthesia, postoperative narcotics, change in appetite and fluid intake all can affect your bowels.  In order to avoid complications following surgery, here are some recommendations in order to help you during your recovery period.  Colace (docusate) - Pick up an over-the-counter form of Colace or another stool softener and take twice a day as long as you are requiring postoperative pain medications.  Take with a full glass of water daily.  If you experience loose stools or diarrhea, hold the colace until you stool forms back up.  If your symptoms do not get better within 1 week or if they get worse, check with your doctor.  Dulcolax (bisacodyl) - Pick up over-the-counter and take as directed by the product packaging as needed to assist with the movement of your bowels.  Take with a full glass of water.  Use this product as needed if not relieved by Colace only.   MiraLax (polyethylene glycol) - Pick up over-the-counter to have on hand.  MiraLax is a solution that will increase the amount of water in your bowels to assist with bowel movements.  Take as directed and can mix with a glass of water, juice, soda, coffee, or tea.  Take if you go more than two days without a movement. Do not use MiraLax more than once per day.  Call your doctor if you are still constipated or irregular after using this medication for 7 days in a row.  If you continue to have problems with postoperative constipation, please contact the office for further assistance and recommendations.  If you experience "the worst abdominal pain ever" or develop nausea or vomiting, please contact the office immediatly for further recommendations for  treatment.   CALL THE OFFICE FOR: Temperature above 101 degrees Excessive bleeding or drainage on the dressing. Excessive swelling, coldness, or paleness of the toes. Persistent nausea and vomiting.  FOLLOW-UP:  You should have an appointment to return to the office in 14 days after surgery. Arrangements have been made for continuation of Physical Therapy (either home therapy or outpatient therapy).   POLAR CARE INFORMATION  [email or web address].com/  How to use [place] Cold Therapy System?  YouTube   [email or web address].tn  OPERATING INSTRUCTIONS  Start the product With dry hands, connect the transformer to the electrical connection located on the top of the cooler. Next, plug the transformer into an appropriate electrical outlet. The unit will automatically start running at this point.  To stop the pump, disconnect electrical power.  Unplug to stop the product when not in use. Unplugging the Polar Care unit turns it off. Always unplug immediately after use. Never leave it plugged in while unattended. Remove pad.    FIRST ADD WATER TO FILL LINE, THEN ICE---Replace ice when existing ice is almost melted  1 Discuss Treatment with your [place] Practitioner and Use Only as Prescribed 2 Apply Insulation Barrier & Cold Therapy Pad 3 Check for Moisture 4 Inspect Skin Regularly  Tips and Trouble Shooting Usage Tips 1. Use cubed or chunked ice for optimal performance. 2. It is recommended to drain the Pad between uses. To drain the pad, hold the Pad upright with the hose pointed toward the ground. Depress the black plunger and allow water to drain out. 3. You may disconnect the Pad from the unit without removing the pad from the affected area by depressing the silver tabs on the hose coupling and gently pulling the hoses apart. The Pad and unit will seal itself and will not leak. Note: Some dripping during release is normal. 4. DO NOT RUN PUMP  WITHOUT WATER! The pump in this unit is designed to run with water. Running the unit without water will cause permanent damage to the pump. 5. Unplug unit before removing lid.  TROUBLESHOOTING GUIDE Pump not running, Water not flowing to the pad, Pad is not getting cold 1. Make sure the transformer is plugged into the wall outlet. 2. Confirm that the ice and water are filled to the indicated levels. 3. Make sure there are no kinks in the pad. 4. Gently pull on the blue tube to make sure the tube/pad junction is straight. 5. Remove the pad from the treatment site and ll it while the pad is lying at; then reapply. 6. Confirm that the pad couplings are securely attached to the unit. Listen for the double clicks (Figure 1) to confirm the pad couplings are securely attached.  Leaks    Note: Some condensation on the lines, controller, and pads is unavoidable, especially in warmer climates. 1. If using a Breg Polar Care Cold Therapy unit with a detachable Cold Therapy Pad, and a leak exists (other than condensation on the lines) disconnect the pad couplings. Make sure the silver tabs on the couplings are depressed before reconnecting the pad to the pump hose; then confirm  both sides of the coupling are properly clicked in. 2. If the coupling continues to leak or a leak is detected in the pad itself, stop using it and call [place] at [phone].  Cleaning After use, empty and dry the unit with a soft cloth. Warm water and mild detergent may be used occasionally to clean the pump and tubes.  WARNING: The [place] can be cold enough to cause serious injury, including full skin necrosis. Follow these Operating Instructions, and carefully read the Product Insert (see pouch on side of unit) and the Cold Therapy Pad Fitting Instructions (provided with each Cold Therapy Pad) prior to use.

## 2022-07-22 NOTE — Progress Notes (Signed)
Patient is not able to walk the distance required to go the bathroom, or he/she is unable to safely negotiate stairs required to access the bathroom.  A 3in1 BSC will alleviate this problem  

## 2022-07-22 NOTE — Progress Notes (Signed)
Physical Therapy Treatment Patient Details Name: April [name] MRN: [id number] DOB: 09-04-1945 Today's Date: 07/22/2022   History of Present Illness Pt is a 77 y/o F admitted on 07/21/22 for scheduled R TKA. PMH: adhesive capsulitis, CAD, diverticulosis, HLD, HTN, MI, PE, urinary incontinence    PT Comments    Pt seen for PT tx with pt agreeable. Pt is able to complete transfers with mod I, ambulation with RW & mod I, & negotiate stairs with B rails with distant supervision. Pt is making great progress with functional mobility with no c/o pain during session. Team notified of pt's CLOF & pt anticipating d/c today.    Recommendations for follow up therapy are one component of a multi-disciplinary discharge planning process, led by the attending physician.  Recommendations may be updated based on patient status, additional functional criteria and insurance authorization.  Follow Up Recommendations       Assistance Recommended at Discharge PRN  Patient can return home with the following Assistance with cooking/housework;Help with stairs or ramp for entrance;Assist for transportation   Equipment Recommendations  Rolling walker (2 wheels)    Recommendations for Other Services       Precautions / Restrictions Precautions Precautions: Fall Restrictions Weight Bearing Restrictions: Yes RLE Weight Bearing: Weight bearing as tolerated     Mobility  Bed Mobility               General bed mobility comments: not tested, pt received sitting EOB, left sitting in recliner    Transfers Overall transfer level: Needs assistance Equipment used: Rolling walker (2 wheels) Transfers: Sit to/from Stand Sit to Stand: Modified independent (Device/Increase time)                Ambulation/Gait Ambulation/Gait assistance: Modified independent (Device/Increase time) Gait Distance (Feet): 150 Feet (+ 150 ft) Assistive device: Rolling walker (2 wheels) Gait Pattern/deviations:  Step-through pattern, Decreased step length - right, Decreased weight shift to right Gait velocity: slightly decreased         Stairs Stairs: Yes Stairs assistance: Supervision Stair Management: Two rails, Step to pattern Number of Stairs: 4 General stair comments: PT provides education re: compensatory pattern & pt able to negotiate stairs with distance supervision. Pt feels confident with mobility task.   Wheelchair Mobility    Modified Rankin (Stroke Patients Only)       Balance Overall balance assessment: Needs assistance Sitting-balance support: Feet supported Sitting balance-Leahy Scale: Good     Standing balance support: During functional activity, No upper extremity supported Standing balance-Leahy Scale: Good                              Cognition Arousal/Alertness: Awake/alert Behavior During Therapy: WFL for tasks assessed/performed Overall Cognitive Status: Within Functional Limits for tasks assessed                                          Exercises      General Comments        Pertinent Vitals/Pain Pain Assessment Pain Assessment: Faces Faces Pain Scale: No hurt    Home Living                          Prior Function            PT Goals (current goals  can now be found in the care plan section) Acute Rehab PT Goals Patient Stated Goal: get better PT Goal Formulation: With patient Time For Goal Achievement: 08/04/22 Potential to Achieve Goals: Good Progress towards PT goals: Progressing toward goals    Frequency    BID      PT Plan Current plan remains appropriate    Co-evaluation              AM-PAC PT "6 Clicks" Mobility   Outcome Measure  Help needed turning from your back to your side while in a flat bed without using bedrails?: None Help needed moving from lying on your back to sitting on the side of a flat bed without using bedrails?: None Help needed moving to and from a  bed to a chair (including a wheelchair)?: None Help needed standing up from a chair using your arms (e.g., wheelchair or bedside chair)?: None Help needed to walk in hospital room?: None Help needed climbing 3-5 steps with a railing? : A Little 6 Click Score: 23    End of Session   Activity Tolerance: Patient tolerated treatment well Patient left: in chair;with family/visitor present;with call bell/phone within reach Nurse Communication: Mobility status PT Visit Diagnosis: Other abnormalities of gait and mobility (R26.89);Difficulty in walking, not elsewhere classified (R26.2);Muscle weakness (generalized) (M62.81)     Time: [id number] PT Time Calculation (min) (ACUTE ONLY): 11 min  Charges:  $Therapeutic Activity: 8-22 mins                     Lavone Nian, PT, DPT 07/22/22, 9:35 AM   Waunita Schooner 07/22/2022, 9:34 AM

## 2022-07-22 NOTE — Anesthesia Postprocedure Evaluation (Signed)
Anesthesia Post Note  Patient: April [name]  Procedure(s) Performed: TOTAL KNEE ARTHROPLASTY (Right: Knee)  Patient location during evaluation: Nursing Unit Anesthesia Type: Spinal Level of consciousness: oriented and awake and alert Pain management: pain level controlled Vital Signs Assessment: post-procedure vital signs reviewed and stable Respiratory status: spontaneous breathing and respiratory function stable Cardiovascular status: blood pressure returned to baseline and stable Postop Assessment: no headache, no backache, no apparent nausea or vomiting and patient able to bend at knees Anesthetic complications: no   There were no known notable events for this encounter.   Last Vitals:  Vitals:   07/22/22 0738 07/22/22 1018  BP: (!) 154/65   Pulse: (!) 58 (!) 59  Resp: 17   Temp: 36.8 C   SpO2:  98%    Last Pain:  Vitals:   07/22/22 0738  TempSrc: Temporal  PainSc: 0-No pain                 Tiegan Jambor Lorenza Chick

## 2022-07-22 NOTE — Discharge Summary (Addendum)
Physician Discharge Summary  Patient ID: April [name] MRN: [id number] DOB/AGE: November 08, 1945 77 y.o.  Admit date: 07/21/2022 Discharge date: 07/22/2022  Admission Diagnoses:  S/P TKR (total knee replacement) using cement, right [Z96.651]   Discharge Diagnoses: Patient Active Problem List   Diagnosis Date Noted   S/P TKR (total knee replacement) using cement, right 07/21/2022   HLD (hyperlipidemia) 01/11/2018   Microcalcification of right breast on mammogram 05/27/2017   Prothrombin gene mutation ([place]) 11/25/2015   Pulmonary embolism ([place]) 04/14/2015   Arthritis involving multiple sites 07/03/2014   Coronary artery disease 12/22/2013   Complete rupture of rotator cuff 07/21/2013   Disorder of bursae and tendons in shoulder region 07/21/2013   Enthesopathy of hip region 07/21/2013   Essential hypertension 07/21/2013   Plantar fascial fibromatosis 07/21/2013   Pure hypercholesterolemia 07/21/2013    Past Medical History:  Diagnosis Date   Cardiac abnormality    STENT   Cataract    Coronary artery disease    Diverticula of colon    Endometriosis    History of shingles    Hx of blood clots 2017   [facility]   Kidney stone    Myocardial infarction ([place])    2006   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 female who was admitted 07/21/2022 with a diagnosis of S/P TKR (total knee replacement) using cement, right and went to the operating room on 07/21/2022 and underwent the above named procedures.    Surgeries: Procedure(s): TOTAL KNEE ARTHROPLASTY on 07/21/2022 Patient tolerated the surgery well. Taken to PACU where she was stabilized and then transferred to the orthopedic  floor.  Started on Lovenox 30 mg q 12 hrs. TEDs and SCDs applied bilaterally. Heels elevated on bed. No evidence of DVT. Negative [place]. Physical therapy started on day #1 for gait training and transfer. OT started day #1 for ADL and assisted devices. Patient's IV was d/c on day #1. Patient was able to safely and independently complete all PT goals. PT recommending discharge to home.   On post op day #1 patient was stable and ready for discharge to home with HHPT.  Implants: Persona Size 6 CR   Tibia: Persona Size D w/ 14x20mm stem extension  Poly: 28mm MC  Patella: 29x50mm symmetric cemented   She was given perioperative antibiotics:  Anti-infectives (From admission, onward)    Start     Dose/Rate Route Frequency Ordered Stop   07/21/22 1330  ceFAZolin (ANCEF) IVPB 2g/100 mL premix        2 g 200 mL/hr over 30 Minutes Intravenous Every 6 hours 07/21/22 0955 07/21/22 1928   07/21/22 0615  ceFAZolin (ANCEF) IVPB 2g/100 mL premix        2 g 200 mL/hr over 30 Minutes Intravenous On call to O.R. 07/21/22 0601 07/21/22 0811   07/21/22 0610  ceFAZolin (ANCEF) 2-4 GM/100ML-% IVPB       Note to Pharmacy: Herby Abraham W: cabinet override      07/21/22 0610 07/21/22 1420     .  She was given sequential compression devices, early ambulation, and Lovenox TEDs  for DVT prophylaxis.  She benefited maximally from the hospital stay and  there were no complications.    Recent vital signs:  Vitals:   07/22/22 0310 07/22/22 0738  BP: (!) 153/58 (!) 154/65  Pulse: 61 (!) 58  Resp: 16 17  Temp: 97.6 F (36.4 C) 98.2 F (36.8 C)  SpO2: 97%     Recent laboratory studies:  Lab Results  Component Value Date   HGB 10.9 (L) 07/22/2022   HGB 12.4 07/11/2022   HGB 12.3 03/30/2022   Lab Results  Component Value Date   WBC 11.2 (H) 07/22/2022   PLT 229 07/22/2022   No results found for: "INR" Lab Results  Component Value Date   NA 135 07/22/2022   K 4.3 07/22/2022   CL 109 07/22/2022   CO2  22 07/22/2022   BUN 31 (H) 07/22/2022   CREATININE 1.09 (H) 07/22/2022   GLUCOSE 143 (H) 07/22/2022    Discharge Medications:   Allergies as of 07/22/2022       Reactions   Antihistamines, Chlorpheniramine-type Other (See Comments)   "think crazy thoughts"   Ciprofloxacin Other (See Comments)   Patient cannot remember reaction   Dristan Cold [chlorphen-pe-acetaminophen] Other (See Comments)   "think crazy thoughts"   Ginger Diarrhea   Nitrofurantoin Nausea Only        Medication List     TAKE these medications    acetaminophen 500 MG tablet Commonly known as: TYLENOL Take 2 tablets (1,000 mg total) by mouth every 8 (eight) hours. What changed:  medication strength how much to take when to take this reasons to take this   aspirin EC 81 MG tablet Take 81 mg by mouth. What changed: Another medication with the same name was added. Make sure you understand how and when to take each.   aspirin EC 81 MG tablet Take 1 tablet (81 mg total) by mouth 2 (two) times daily for 14 days. Swallow whole. Start 08/13/2022 after completion of Lovenox Start taking on: August 13, 2022 What changed: You were already taking a medication with the same name, and this prescription was added. Make sure you understand how and when to take each.   atenolol 25 MG tablet Commonly known as: TENORMIN Take 1 tablet by mouth daily.   atorvastatin 40 MG tablet Commonly known as: LIPITOR Take 40 mg by mouth daily.   calcium citrate-vitamin D 315-200 MG-UNIT tablet Commonly known as: CITRACAL+D Take 1 tablet by mouth 2 (two) times daily.   celecoxib 100 MG capsule Commonly known as: CeleBREX Take 1 capsule (100 mg total) by mouth 2 (two) times daily for 7 days.   docusate sodium 100 MG capsule Commonly known as: COLACE Take 1 capsule (100 mg total) by mouth 2 (two) times daily.   enoxaparin 40 MG/0.4ML injection Commonly known as: LOVENOX Inject 0.4 mLs (40 mg total) into the skin daily for  21 days. Start taking on: July 23, 2022   EPINEPHrine 0.3 mg/0.3 mL Soaj injection Commonly known as: EpiPen 2-Pak Inject 0.3 mLs (0.3 mg total) into the muscle as needed for anaphylaxis.   Frankincense Uplifting Oil Inhale into the lungs.   ondansetron 4 MG tablet Commonly known as: ZOFRAN Take 1 tablet (4 mg total) by mouth every 6 (six) hours as needed for nausea.   QC TUMERIC COMPLEX PO Take 1,000 mg by mouth daily at 2 PM.   traMADol 50 MG tablet Commonly known as: ULTRAM Take 1 tablet (50 mg total) by mouth every 6 (six) hours as needed for moderate pain.   Vitamin D3  50 MCG (2000 UT) Caps Generic drug: Cholecalciferol Take 1 capsule by mouth daily.               Durable Medical Equipment  (From admission, onward)           Start     Ordered   07/21/22 1450  For home use only DME Walker rolling  Once       Question Answer Comment  Walker: With 5 Inch Wheels   Patient needs a walker to treat with the following condition General weakness      07/21/22 1449            Diagnostic Studies: DG Knee Right Port  Result Date: 07/21/2022 CLINICAL DATA:  Status post right knee arthroplasty EXAM: PORTABLE RIGHT KNEE - 1-2 VIEW COMPARISON:  None Available. FINDINGS: There is right knee arthroplasty. Irregularity in the soft tissues along the anterior aspect may be related to recent surgery. IMPRESSION: Status post right knee arthroplasty. Electronically Signed   By: Elmer Picker M.D.   On: 07/21/2022 09:59   PERIPHERAL VASCULAR CATHETERIZATION  Result Date: 07/08/2022 See surgical note for result.   Disposition:  Discharge disposition: 06-Home-Health Care Svc          Follow-up Information     Duanne Guess, PA-C Follow up in 2 week(s).   Specialties: Orthopedic Surgery, Emergency Medicine Contact information: [place] [address] [phone]                  Signed: Feliberto Gottron 07/22/2022, 7:46 AM

## 2022-07-22 NOTE — TOC Progression Note (Signed)
Transition of Care [facility]) - Progression Note    Patient Details  Name: April [name] MRN: [id number] Date of Birth: 1945-07-02  Transition of Care [facility] CM/SW [place], RN Phone Number: 07/22/2022, 8:43 AM  Clinical Narrative:     The patient is set up prior to surgery by surgeons office for [facility] thru [place] She has a Standard walker but will need a rolling walker and a 3 in1, Kristen with [employer] made aware, [employer] will deliver to the bedside prior to DC  Expected Discharge Plan: [place] Barriers to Discharge: No Barriers Identified  Expected Discharge Plan and Services   Discharge Planning Services: CM Consult   Living arrangements for the past 2 months: Single Family Home Expected Discharge Date: 07/22/22               DME Arranged: Gilford Rile rolling, 3-N-1 DME Agency: [employer] Date DME Agency Contacted: 07/22/22 Time DME Agency Contacted: [phone] Representative spoke with at DME Agency: [place]: PT [place]: [place] Date [place]: 07/22/22 Time [place]: [phone] Representative spoke with at [place]: [place]   Social Determinants of Health ([place]) Interventions SDOH Screenings   Food Insecurity: No Food Insecurity (07/21/2022)  Housing: Low Risk  (07/21/2022)  Transportation Needs: No Transportation Needs (07/21/2022)  Utilities: Not At Risk (07/21/2022)  Tobacco Use: Low Risk  (07/21/2022)    Readmission Risk Interventions     No data to display

## 2022-07-22 NOTE — Progress Notes (Addendum)
   Subjective: 1 Day Post-Op Procedure(s) (LRB): TOTAL KNEE ARTHROPLASTY (Right) Patient reports pain as mild.   Patient is well, and has had no acute complaints or problems Denies any CP, SOB, ABD pain. We will continue therapy today.  Plan is to go Home after hospital stay.  Objective: Vital signs in last 24 hours: Temp:  [97 F (36.1 C)-98.4 F (36.9 C)] 97.6 F (36.4 C) (03/26 0310) Pulse Rate:  [56-66] 61 (03/26 0310) Resp:  [11-20] 16 (03/26 0310) BP: (140-170)/(53-77) 153/58 (03/26 0310) SpO2:  [95 %-100 %] 97 % (03/26 0310)  Intake/Output from previous day: 03/25 0701 - 03/26 0700 In: 1936.4 [P.O.:480; I.V.:1256.4; IV Piggyback:200] Out: 425 [Urine:400; Blood:25] Intake/Output this shift: No intake/output data recorded.  Recent Labs    07/22/22 0707  HGB 10.9*   Recent Labs    07/22/22 0707  WBC 11.2*  RBC 3.56*  HCT 33.0*  PLT 229   No results for input(s): "NA", "K", "CL", "CO2", "BUN", "CREATININE", "GLUCOSE", "CALCIUM" in the last 72 hours. No results for input(s): "LABPT", "INR" in the last 72 hours.  EXAM General - Patient is Alert, Appropriate, and Oriented Extremity - Neurovascular intact Sensation intact distally Intact pulses distally Dorsiflexion/Plantar flexion intact No cellulitis present Compartment soft Dressing - dressing C/D/I and no drainage Motor Function - intact, moving foot and toes well on exam.   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   Weight as of this encounter: 85.9 kg. Advance diet Up with therapy Pain well controlled VSS Labs stable, Hgb 10.9. CM to assist with discharge to home with HHPT pending safe compeltion of PT goals   Patient is not able to walk the distance required to go the bathroom, or he/she is unable to safely negotiate stairs required to access the bathroom.  A 3in1 BSC will alleviate this problem.       DVT Prophylaxis - Lovenox, TED hose, and SCDs Weight-Bearing as tolerated to right leg   T. Rachelle Hora, PA-C [place] 07/22/2022, 7:29 AM   Patient seen and examined, agree with above plan.  The patient is doing well status post right total knee arthroplasty, no concerns at this time.  Pain is controlled.  Discussed DVT prophylaxis, pain medication use, and safe transition to home.  All questions answered the patient agrees with above plan will go home after clears PT.   Steffanie Rainwater MD

## 2022-07-23 ENCOUNTER — Encounter: Payer: Self-pay | Admitting: Orthopedic Surgery

## 2022-07-28 ENCOUNTER — Encounter: Payer: Self-pay | Admitting: Orthopedic Surgery

## 2022-08-20 ENCOUNTER — Encounter: Payer: Self-pay | Admitting: Emergency Medicine

## 2022-08-20 ENCOUNTER — Emergency Department
Admission: EM | Admit: 2022-08-20 | Discharge: 2022-08-20 | Disposition: A | Discharge status: Home / Self Care | Payer: HMO | Attending: Emergency Medicine | Admitting: Emergency Medicine

## 2022-08-20 ENCOUNTER — Other Ambulatory Visit: Payer: Self-pay

## 2022-08-20 ENCOUNTER — Emergency Department: Payer: HMO

## 2022-08-20 DIAGNOSIS — J069 Acute upper respiratory infection, unspecified: Secondary | ICD-10-CM | POA: Insufficient documentation

## 2022-08-20 DIAGNOSIS — Z1152 Encounter for screening for COVID-19: Secondary | ICD-10-CM | POA: Insufficient documentation

## 2022-08-20 DIAGNOSIS — R059 Cough, unspecified: Secondary | ICD-10-CM | POA: Diagnosis present

## 2022-08-20 LAB — RESP PANEL BY RT-PCR (RSV, FLU A&B, COVID)  RVPGX2
Influenza A by PCR: NEGATIVE
Influenza B by PCR: NEGATIVE
Resp Syncytial Virus by PCR: NEGATIVE
SARS Coronavirus 2 by RT PCR: NEGATIVE

## 2022-08-20 MED ORDER — GUAIFENESIN-CODEINE 100-10 MG/5ML PO SOLN: 5.0000 mL | Freq: Four times a day (QID) | ORAL | 0 refills | Status: DC | PRN

## 2022-08-20 NOTE — ED Triage Notes (Signed)
Patient ambulatory to triage with steady gait, without difficulty or distress noted; pt reports since Sunday having nonprod cough and bodyaches; denies any other accomp symptoms

## 2022-08-20 NOTE — Discharge Instructions (Signed)
Follow-up with your primary care provider if any continued problems or concerns.  Increase fluids to stay hydrated.  Tylenol if needed for body aches or fever.  A prescription for guaifenesin with codeine was sent to the pharmacy for you to take every 6 hours as needed for cough.  This medication could cause drowsiness and increase your risk for falling.  Discontinue taking the Mucinex as some of the same medication is included in with the cough medication.

## 2022-08-20 NOTE — ED Provider Notes (Addendum)
[facility] Provider Note    Event Date/Time   First MD Initiated Contact with Patient 08/20/22 [phone]     (approximate)   History   Cough   HPI  April [name] is a 77 y.o. female   presents to the ED with complaint of nonproductive cough and bodyaches that started 3 days ago.  Patient states she has had a low-grade temp along with her cough.  She also reports that she has had some ear "drainage".  She has been taking Tessalon Perles without any relief of her cough.  She denies any nausea, vomiting or diarrhea.  No known sick contacts.      Physical Exam   Triage Vital Signs: ED Triage Vitals  Enc Vitals Group     BP 08/20/22 0620 (!) 164/85     Pulse Rate 08/20/22 0620 76     Resp 08/20/22 0620 18     Temp 08/20/22 0620 99.2 F (37.3 C)     Temp Source 08/20/22 0620 Oral     SpO2 08/20/22 0620 97 %     Weight 08/20/22 0625 185 lb (83.9 kg)     Height 08/20/22 0625  (1.702 m)     Head Circumference --      Peak Flow --      Pain Score 08/20/22 0625 7     Pain Loc --      Pain Edu? --      Excl. in GC? --     Most recent vital signs: Vitals:   08/20/22 0620  BP: (!) 164/85  Pulse: 76  Resp: 18  Temp: 99.2 F (37.3 C)  SpO2: 97%     General: Awake, no distress.  Patient is alert and talkative.  No shortness of breath noted and patient is able to speak in complete sentences. CV:  Good peripheral perfusion.  Heart regular rate and rhythm. Resp:  Normal effort.  Lungs are clear bilaterally.  Nonproductive cough noted during physical exam. Abd:  No distention.  Other:     ED Results / Procedures / Treatments   Labs (all labs ordered are listed, but only abnormal results are displayed) Labs Reviewed  RESP PANEL BY RT-PCR (RSV, FLU A&B, COVID)  [id number]     RADIOLOGY Chest x-ray images were reviewed and interpreted by myself independent of the radiologist with no infiltrate was noted.  Official radiology report is no  acute cardiopulmonary disease.    PROCEDURES:  Critical Care performed:   Procedures   MEDICATIONS ORDERED IN ED: Medications - No data to display   IMPRESSION / MDM / ASSESSMENT AND PLAN / ED COURSE  I reviewed the triage vital signs and the nursing notes.   Differential diagnosis includes, but is not limited to, COVID, influenza, RSV, upper respiratory infection, pneumonia, bronchospasms.  77 year old female presents to the ED with complaint of nonproductive cough and bodyaches for approximately 3 days.  Patient has been taking Tessalon Perles without any relief of her cough.  X-ray was negative and reassuring to the patient.  She was also made aware that her COVID, influenza and RSV swab was negative.  A prescription for Robitussin AC was sent to the pharmacy for her to take as needed for cough every 6 hours.  Patient is aware that this could cause drowsiness and increase her risk for falling.  She is to follow-up with her PCP if any continued problems and return to the emergency department if any severe worsening  of her symptoms.      Patient's presentation is most consistent with acute complicated illness / injury requiring diagnostic workup.  FINAL CLINICAL IMPRESSION(S) / ED DIAGNOSES   Final diagnoses:  Viral URI with cough     Rx / DC Orders   ED Discharge Orders          Ordered    guaiFENesin-codeine 100-10 MG/5ML syrup  Every 6 hours PRN        08/20/22 0723             Note:  This document was prepared using Dragon voice recognition software and may include unintentional dictation errors.   Tommi Rumps, PA-C 08/20/22 1408    Tommi Rumps, PA-C 08/20/22 1408    Georga Hacking, MD 08/20/22 [phone]

## 2022-08-27 ENCOUNTER — Other Ambulatory Visit (INDEPENDENT_AMBULATORY_CARE_PROVIDER_SITE_OTHER): Payer: Self-pay | Admitting: Vascular Surgery

## 2022-08-27 DIAGNOSIS — Z86718 Personal history of other venous thrombosis and embolism: Secondary | ICD-10-CM

## 2022-08-27 DIAGNOSIS — Z95828 Presence of other vascular implants and grafts: Secondary | ICD-10-CM

## 2022-08-27 DIAGNOSIS — Z86711 Personal history of pulmonary embolism: Secondary | ICD-10-CM

## 2022-08-31 NOTE — Progress Notes (Deleted)
MRN : [id number]  April [name] is a 77 y.o. (1946-02-11) female who presents with chief complaint of legs hurt and swell.  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  DVT was identified years ago and was treated with anticoagulation.  The presenting symptoms were pain and swelling in the lower extremity.  Procedure dated July 08, 2022: Placement of an option Elite IVC filter.  She is status post right total knee replacement July 21, 2022.  The patient has not been using compression therapy at this point.  No SOB or pleuritic chest pains.  No cough or hemoptysis.  No blood per rectum or blood in any sputum.  No excessive bruising per the patient.    No recent shortening of the patient's walking distance or new symptoms consistent with claudication.  No history of rest pain symptoms. No new ulcers or wounds of the lower extremities have occurred.  The patient denies amaurosis fugax or recent TIA symptoms. There are no recent neurological changes noted. No recent episodes of angina or shortness of breath documented.    No outpatient medications have been marked as taking for the 09/01/22 encounter (Appointment) with Gilda Crease, Latina Craver, MD.    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  Gerarda Fraction, MD   Pulmonary embolism ([facility]) 2016   Pulmonary embolism ([facility]) 2016   Vertigo 1990   "inner ear"    Past Surgical History:  Procedure Laterality Date   ABDOMINAL HYSTERECTOMY     APPENDECTOMY     BREAST BIOPSY Right 04/30/2020   11:00 5cmfn  Qmarker-benign, 11:00 8 cmfn twisted X marker-benign   CARDIAC CATHETERIZATION  2005   CATARACT EXTRACTION W/PHACO Left 02/08/2019   Procedure: CATARACT EXTRACTION PHACO AND INTRAOCULAR LENS PLACEMENT (IOC) LEFT 00:40.0  20.6%  8.26;  Surgeon: Galen Manila, MD;  Location: [facility];  Service: Ophthalmology;  Laterality: Left;   CATARACT EXTRACTION W/PHACO Right 04/26/2019   Procedure: CATARACT EXTRACTION PHACO AND INTRAOCULAR LENS PLACEMENT (IOC) RIGHT 6.83 00:46.0;  Surgeon: Galen Manila, MD;  Location: [facility];  Service: Ophthalmology;  Laterality: Right;   CHOLECYSTECTOMY     COLONOSCOPY  2017   FOOT SURGERY Right    IVC FILTER INSERTION N/A 07/08/2022   Procedure: IVC FILTER INSERTION;  Surgeon: Renford Dills, MD;  Location: [facility];  Service: Cardiovascular;  Laterality: N/A;   TOTAL KNEE ARTHROPLASTY Right 07/21/2022   Procedure: TOTAL KNEE ARTHROPLASTY;  Surgeon: Reinaldo Berber, MD;  Location: [facility] OR[facility];  Service: Orthopedics;  Laterality: Right;    Social History Social History   Tobacco Use   Smoking status: Never   Smokeless tobacco: Never  Vaping Use   Vaping Use: Never used  Substance Use Topics   Alcohol use: No   Drug use: No    Family History Family History  Problem Relation Age of Onset   Stroke Mother    CAD  Father    Colon cancer Other        paternal great grandfather   Breast cancer Neg Hx     Allergies  Allergen Reactions   Antihistamines, Chlorpheniramine-Type Other (See Comments)    "think crazy thoughts"   Ciprofloxacin Other (See Comments)    Patient cannot remember reaction   Dristan Cold [Chlorphen-Pe-Acetaminophen] Other (See Comments)    "think crazy thoughts"   Ginger Diarrhea   Nitrofurantoin Nausea Only     REVIEW OF SYSTEMS (Negative unless checked)  Constitutional: [] Weight loss  [] Fever  [] Chills Cardiac: [] Chest pain   [] Chest pressure   [] Palpitations   [] Shortness of breath  when laying flat   [] Shortness of breath with exertion. Vascular:  [] Pain in legs with walking   [x] Pain in legs at rest  [] History of DVT   [] Phlebitis   [x] Swelling in legs   [] Varicose veins   [] Non-healing ulcers Pulmonary:   [] Uses home oxygen   [] Productive cough   [] Hemoptysis   [] Wheeze  [] COPD   [] Asthma Neurologic:  [] Dizziness   [] Seizures   [] History of stroke   [] History of TIA  [] Aphasia   [] Vissual changes   [] Weakness or numbness in arm   [] Weakness or numbness in leg Musculoskeletal:   [] Joint swelling   [] Joint pain   [] Low back pain Hematologic:  [] Easy bruising  [] Easy bleeding   [] Hypercoagulable state   [] Anemic Gastrointestinal:  [] Diarrhea   [] Vomiting  [] Gastroesophageal reflux/heartburn   [] Difficulty swallowing. Genitourinary:  [] Chronic kidney disease   [] Difficult urination  [] Frequent urination   [] Blood in urine Skin:  [] Rashes   [] Ulcers  Psychological:  [] History of anxiety   []  History of major depression.  Physical Examination  There were no vitals filed for this visit. There is no height or weight on file to calculate BMI. Gen: WD/WN, NAD Head: [place]/AT, No temporalis wasting.  Ear/Nose/Throat: Hearing grossly intact, nares w/o erythema or drainage, pinna without lesions Eyes: PER, EOMI, sclera nonicteric.  Neck: Supple, no gross masses.  No JVD.  Pulmonary:  Good air movement, no audible wheezing, no use of accessory muscles.  Cardiac: RRR, precordium not hyperdynamic. Vascular:  scattered varicosities present bilaterally.  Moderate venous stasis changes to the legs bilaterally.  2+ soft pitting edema. CEAP [id number]   Vessel Right Left  Radial Palpable Palpable  Gastrointestinal: soft, non-distended. No guarding/no peritoneal signs.  Musculoskeletal: M/S 5/5 throughout.  No deformity.  Neurologic: CN 2-12 intact. Pain and light touch intact in extremities.  Symmetrical.  Speech is fluent. Motor exam as listed above. Psychiatric: Judgment intact, Mood &  affect appropriate for pt's clinical situation. Dermatologic: Venous rashes no ulcers noted.  No changes consistent with cellulitis. Lymph : No lichenification or skin changes of chronic lymphedema.  CBC Lab Results  Component Value Date   WBC 11.2 (H) 07/22/2022   HGB 10.9 (L) 07/22/2022   HCT 33.0 (L) 07/22/2022   MCV 92.7 07/22/2022   PLT 229 07/22/2022    BMET    Component Value Date/Time   NA 135 07/22/2022 0707   K 4.3 07/22/2022 0707   CL 109 07/22/2022 0707   CO2 22 07/22/2022 0707   GLUCOSE 143 (H) 07/22/2022 0707   BUN 31 (H) 07/22/2022 0707   CREATININE 1.09 (H) 07/22/2022 0707   CALCIUM 8.4 (L) 07/22/2022 0707   GFRNONAA 52 (L) 07/22/2022 0707   GFRAA >60 04/16/2015 0532   CrCl cannot be calculated (Patient's most recent lab result is older than the  maximum 21 days allowed.).  COAG No results found for: "INR", "PROTIME"  Radiology DG Chest 2 View  Result Date: 08/20/2022 CLINICAL DATA:  Cough. EXAM: CHEST - 2 VIEW COMPARISON:  04/14/2015 FINDINGS: The lungs are clear without focal pneumonia, edema, pneumothorax or pleural effusion. The cardiopericardial silhouette is within normal limits for size. Thoracolumbar scoliosis evident. IMPRESSION: No active cardiopulmonary disease. Electronically Signed   By: Kennith Center M.D.   On: 08/20/2022 06:56     Assessment/Plan There are no diagnoses linked to this encounter.   [name], MD  08/31/2022 1:53 PM

## 2022-09-01 ENCOUNTER — Encounter (INDEPENDENT_AMBULATORY_CARE_PROVIDER_SITE_OTHER): Payer: Self-pay

## 2022-09-01 ENCOUNTER — Ambulatory Visit (INDEPENDENT_AMBULATORY_CARE_PROVIDER_SITE_OTHER): Payer: HMO | Admitting: Vascular Surgery

## 2022-09-01 ENCOUNTER — Ambulatory Visit (INDEPENDENT_AMBULATORY_CARE_PROVIDER_SITE_OTHER): Payer: HMO

## 2022-09-01 DIAGNOSIS — I2699 Other pulmonary embolism without acute cor pulmonale: Secondary | ICD-10-CM

## 2022-09-01 DIAGNOSIS — I25119 Atherosclerotic heart disease of native coronary artery with unspecified angina pectoris: Secondary | ICD-10-CM

## 2022-09-01 DIAGNOSIS — Z95828 Presence of other vascular implants and grafts: Secondary | ICD-10-CM

## 2022-09-01 DIAGNOSIS — Z86711 Personal history of pulmonary embolism: Secondary | ICD-10-CM

## 2022-09-01 DIAGNOSIS — M129 Arthropathy, unspecified: Secondary | ICD-10-CM

## 2022-09-01 DIAGNOSIS — E782 Mixed hyperlipidemia: Secondary | ICD-10-CM

## 2022-09-01 DIAGNOSIS — Z86718 Personal history of other venous thrombosis and embolism: Secondary | ICD-10-CM | POA: Diagnosis not present

## 2022-09-08 ENCOUNTER — Telehealth (INDEPENDENT_AMBULATORY_CARE_PROVIDER_SITE_OTHER): Payer: Self-pay | Admitting: Vascular Surgery

## 2022-09-08 ENCOUNTER — Telehealth (INDEPENDENT_AMBULATORY_CARE_PROVIDER_SITE_OTHER): Payer: Self-pay

## 2022-09-08 NOTE — Telephone Encounter (Signed)
Patient is calling wanting an appt to get scheduled for the IVC filter removal.... patient only seen for DVT US only on  09/01/2022    Please advise

## 2022-09-08 NOTE — Telephone Encounter (Signed)
I had a message sent to voicemail from the patient regarding someone calling her about her filter.  I pulled the encounter form and it was a lab only appt.

## 2022-09-10 NOTE — Telephone Encounter (Signed)
We brought her in recently to have a bilateral DVT scan which was negative.  We can proceed with IVC filter removal

## 2022-09-10 NOTE — Telephone Encounter (Signed)
Spoke with the patient and she is scheduled with Dr. Gilda Crease on 09/23/22 with a 11:00 am arrival time to the [facility] for a IVC filter removal. Pre-procedure instructions were discussed and will be sent to Mychart and mailed.

## 2022-09-23 ENCOUNTER — Ambulatory Visit
Admission: RE | Admit: 2022-09-23 | Discharge: 2022-09-23 | Disposition: A | Discharge status: Home / Self Care | Payer: HMO | Attending: Vascular Surgery | Admitting: Vascular Surgery

## 2022-09-23 ENCOUNTER — Other Ambulatory Visit: Payer: Self-pay

## 2022-09-23 ENCOUNTER — Encounter
Admission: RE | Disposition: A | Discharge status: Home / Self Care | Payer: Self-pay | Source: Home / Self Care | Attending: Vascular Surgery

## 2022-09-23 ENCOUNTER — Encounter: Payer: Self-pay | Admitting: Vascular Surgery

## 2022-09-23 DIAGNOSIS — I2699 Other pulmonary embolism without acute cor pulmonale: Secondary | ICD-10-CM | POA: Diagnosis present

## 2022-09-23 DIAGNOSIS — Z7901 Long term (current) use of anticoagulants: Secondary | ICD-10-CM

## 2022-09-23 DIAGNOSIS — Z9889 Other specified postprocedural states: Secondary | ICD-10-CM | POA: Diagnosis not present

## 2022-09-23 DIAGNOSIS — I82409 Acute embolism and thrombosis of unspecified deep veins of unspecified lower extremity: Secondary | ICD-10-CM | POA: Diagnosis not present

## 2022-09-23 DIAGNOSIS — E782 Mixed hyperlipidemia: Secondary | ICD-10-CM | POA: Diagnosis not present

## 2022-09-23 HISTORY — PX: IVC FILTER REMOVAL: CATH118246

## 2022-09-23 HISTORY — DX: Malignant hyperthermia due to anesthesia, initial encounter: T88.3XXA

## 2022-09-23 SURGERY — IVC FILTER REMOVAL
Anesthesia: Moderate Sedation

## 2022-09-23 MED ORDER — MIDAZOLAM HCL 2 MG/2ML IJ SOLN
INTRAMUSCULAR | Status: AC
  Filled 2022-09-23: qty 2

## 2022-09-23 MED ORDER — MIDAZOLAM HCL 2 MG/2ML IJ SOLN
INTRAMUSCULAR | Status: DC | PRN
  Administered 2022-09-23: .5 mg via INTRAVENOUS
  Administered 2022-09-23 (×2): 1 mg via INTRAVENOUS

## 2022-09-23 MED ORDER — FENTANYL CITRATE PF 50 MCG/ML IJ SOSY
PREFILLED_SYRINGE | INTRAMUSCULAR | Status: AC
  Filled 2022-09-23: qty 1

## 2022-09-23 MED ORDER — HYDROMORPHONE HCL 1 MG/ML IJ SOLN: 1.0000 mg | Freq: Once | INTRAMUSCULAR | Status: DC | PRN

## 2022-09-23 MED ORDER — METHYLPREDNISOLONE SODIUM SUCC 125 MG IJ SOLR: 125.0000 mg | Freq: Once | INTRAMUSCULAR | Status: DC | PRN

## 2022-09-23 MED ORDER — CEFAZOLIN SODIUM-DEXTROSE 2-4 GM/100ML-% IV SOLN
INTRAVENOUS | Status: AC
  Filled 2022-09-23: qty 100

## 2022-09-23 MED ORDER — MIDAZOLAM HCL 2 MG/ML PO SYRP: 8.0000 mg | ORAL_SOLUTION | Freq: Once | ORAL | Status: DC | PRN

## 2022-09-23 MED ORDER — FENTANYL CITRATE (PF) 100 MCG/2ML IJ SOLN
INTRAMUSCULAR | Status: DC | PRN
  Administered 2022-09-23: 12.5 ug via INTRAVENOUS
  Administered 2022-09-23: 50 ug via INTRAVENOUS

## 2022-09-23 MED ORDER — SODIUM CHLORIDE 0.9 % IV SOLN: INTRAVENOUS | Status: DC

## 2022-09-23 MED ORDER — FAMOTIDINE 20 MG PO TABS: 40.0000 mg | ORAL_TABLET | Freq: Once | ORAL | Status: DC | PRN

## 2022-09-23 MED ORDER — DIPHENHYDRAMINE HCL 50 MG/ML IJ SOLN: 50.0000 mg | Freq: Once | INTRAMUSCULAR | Status: DC | PRN

## 2022-09-23 MED ORDER — CEFAZOLIN SODIUM-DEXTROSE 1-4 GM/50ML-% IV SOLN: 1.0000 g | INTRAVENOUS | Status: DC

## 2022-09-23 MED ORDER — CEFAZOLIN SODIUM-DEXTROSE 2-4 GM/100ML-% IV SOLN
2.0000 g | INTRAVENOUS | Status: DC
  Administered 2022-09-23: 2 g via INTRAVENOUS

## 2022-09-23 MED ORDER — ONDANSETRON HCL 4 MG/2ML IJ SOLN: 4.0000 mg | Freq: Four times a day (QID) | INTRAMUSCULAR | Status: DC | PRN

## 2022-09-23 SURGICAL SUPPLY — 8 items
COVER PROBE ULTRASOUND 5X96 (MISCELLANEOUS) IMPLANT
ENSNARE 9-15 (MISCELLANEOUS) IMPLANT
KIT SNARE RETRIEVAL (KITS) IMPLANT
NDL ENTRY 21GA 7CM ECHOTIP (NEEDLE) IMPLANT
NEEDLE ENTRY 21GA 7CM ECHOTIP (NEEDLE) ×1 IMPLANT
PACK ANGIOGRAPHY (CUSTOM PROCEDURE TRAY) ×1 IMPLANT
SET INTRO CAPELLA COAXIAL (SET/KITS/TRAYS/PACK) IMPLANT
WIRE GUIDERIGHT .035X150 (WIRE) IMPLANT

## 2022-09-23 NOTE — Interval H&P Note (Signed)
History and Physical Interval Note:  09/23/2022 11:42 AM  April [name]  has presented today for surgery, with the diagnosis of IVC Filter Removal    DVT.  The various methods of treatment have been discussed with the patient and family. After consideration of risks, benefits and other options for treatment, the patient has consented to  Procedure(s): IVC FILTER REMOVAL (N/A) as a surgical intervention.  The patient's history has been reviewed, patient examined, no change in status, stable for surgery.  I have reviewed the patient's chart and labs.  Questions were answered to the patient's satisfaction.     [name]

## 2022-09-23 NOTE — Discharge Instructions (Signed)
Inferior Vena Cava Filter Removal, Care After ?This sheet gives you information about how to care for yourself after your procedure. Your health care provider may also give you more specific instructions. If you have problems or questions, contact your health care provider. ?What can I expect after the procedure? ?After your procedure, it is common to have: ?Mild pain in the area where the filter was removed. ?Mild bruising in the area where the filter was removed. ? ?Follow these instructions at home: ?Removal  site care ?Follow instructions from your health care provider about how to take care of the site where a catheter was removed at your neck.. Make sure you: ?Wash your hands with soap and water before you change your bandage (dressing). If soap and water are not available, use hand sanitizer. ?Leave bandage on for 48hrs.. ?Check your removal site every day for signs of infection. Check for: ?More redness, swelling, or pain. ?More fluid or blood. ?Warmth. ?Pus or a bad smell. ?Keep the removal site clean and dry. ?You may shower tomorrow, do not scrub or rub over the dressing site. ?General instructions ?Take over-the-counter and prescription medicines only as told by your health care provider. ?Avoid heavy lifting or hard activities for 48 hours after the procedure or as told by your health care provider. ?Do not drive for 24 hours if you were given a a medicine to help you relax (sedative). ?Do not drive or use heavy machinery while taking prescription pain medicine. ?Do not go back to school or work until your health care provider approves. ?Keep all follow-up visits as told by your health care provider. This is important. ?Contact a health care provider if: ?You have more redness, swelling, or pain around your removal site. ?You have more fluid or blood coming from you removalr site. ?Your removal site feels warm to the touch. ?You have pus or a bad smell coming from your  removal site. ?You have a  fever. ?You are dizzy. ?You have nausea and vomiting. ?You develop a rash. ?Get help right away if: ?You develop chest pain, a cough, or difficulty breathing. ?You develop shortness of breath, feel faint, or pass out. ?You cough up blood. ?You have severe pain in your abdomen. ?You develop swelling and discoloration or pain in your legs. ?Your legs become pale and cold or blue. ?You develop weakness, difficulty moving your arms or legs, or balance problems. ?You develop problems with speech or vision. ?These symptoms may represent a serious problem that is an emergency. Do not wait to see if the symptoms will go away. Get medical help right away. Call your local emergency services (911 in the [place] Do not drive yourself to the hospital. ?Summary   ?

## 2022-09-23 NOTE — Op Note (Addendum)
  OPERATIVE NOTE   PRE-OPERATIVE DIAGNOSIS: DVT with PE  POST-OPERATIVE DIAGNOSIS: Same  PROCEDURE: Attempted retrieval of IVC Filter unsuccessful Inferior Vena Cavagram  SURGEON: Renford Dills, M.D.  ANESTHESIA:  Conscious sedation was administered under my direct supervision by the interventional radiology RN. IV Versed plus fentanyl were utilized. Continuous ECG, pulse oximetry and blood pressure was monitored throughout the entire procedure. Conscious sedation was for a total of 31 minutes and 5 seconds.  ESTIMATED BLOOD LOSS: Minimal cc  FINDING(S):inferior vena cava is widely patent filter is in place in good position.   SPECIMEN(S): None INDICATIONS:   April [name] is a 77 y.o. female who presents with DVT and PE. The patient has now tolerated anticoagulation for several months. Therefore, the IVC filter is recommended to be removed. The risks and benefits were reviewed with the patient all questions were answered and they agreed to proceed with IVC filter retrieval.    DESCRIPTION: After obtaining full informed written consent, the patient was brought back to the [facility] and placed in the supine position.  The patient received IV antibiotics prior to induction.  After obtaining adequate sedation, the patient was prepped and draped in the standard fashion and appropriate time out is called.     Ultrasound was placed in a sterile sleeve.The right neck was then imaged with ultrasound.   Jugular vein was identified it is echolucent and homogeneous indicating patency. 1% lidocaine is then infiltrated under ultrasound visualization and subsequently a Seldinger needle is inserted under real-time ultrasound guidance.  J-wire is then advanced into the inferior vena cava under fluoroscopic guidance. With the tip of the sheath at the confluence of the iliac veins inferior vena caval imaging is performed.  After review of the image the sheath is repositioned to  above the filter and the snares introduced.  Multiple snares are utilized however I am unable to engage the hook.  Subsequently I readvanced the sheath below the level of the filter and in a steep oblique almost true lateral contrast is utilized to demonstrate the filters hook is embedded in the endothelium.  Sheath is removed by pressures held the patient tolerated the procedure well and there were no immediate complications.  Interpretation: inferior vena cava is widely patent filter is in place with the hook embedded in the endothelium.  I will bring the patient back to the office to discuss advanced techniques for filter retrieval and/or leaving the filter in as a permanent IVC filter.    COMPLICATIONS: None  CONDITION: Almon Register, M.D.  [facility] Office: [phone]   09/23/2022, 1:27 PM

## 2022-09-23 NOTE — Progress Notes (Signed)
MRN : [id number]  April [name] is a 77 y.o. (02/19/1946) female who presents with chief complaint of legs hurt and swell.  History of Present Illness:   The patient presents today to [facility] for removal of her IVC filter.  IVC filter was placed July 08, 2022.  On July 21, 2022 she underwent right total knee arthroplasty.  She has done well and is now time to remove the filter as the potential for perioperative thrombosis is now minimal.  IVC filter was placed prior to her orthopedic surgery given her history of PE which was identified in December 2016 by CT angiogram of the chest and was treated with anticoagulation.  Duplex ultrasound of bilateral lower extremities dated 04/15/2015 was negative for DVT.  The presenting symptoms were shortness of breath and pleuritic chest pain.   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   The patient has not been using compression therapy at this point.   No recent episodes of SOB or pleuritic chest pains.  No cough or hemoptysis.   No blood per rectum or blood in any sputum.  No excessive bruising per the patient.    No recent shortening of the patient's walking distance or new symptoms consistent with claudication.  No history of rest pain symptoms. No new ulcers or wounds of the lower extremities have occurred.   The patient denies amaurosis fugax or recent TIA symptoms. There are no recent neurological changes noted. No recent episodes of angina or shortness of breath documented.   No outpatient medications have been marked as taking for the 09/23/22 encounter [facility] Encounter).    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  Gerarda Fraction, MD   Pulmonary embolism (HCC) 2016   Pulmonary embolism (HCC) 2016   Vertigo 1990   "inner ear"    Past Surgical History:  Procedure Laterality Date   ABDOMINAL HYSTERECTOMY     APPENDECTOMY     BREAST BIOPSY Right 04/30/2020   11:00 5cmfn Qmarker-benign, 11:00 8 cmfn twisted X marker-benign   CARDIAC CATHETERIZATION  2005   CATARACT EXTRACTION W/PHACO Left 02/08/2019   Procedure: CATARACT EXTRACTION PHACO AND INTRAOCULAR LENS PLACEMENT (IOC) LEFT 00:40.0  20.6%  8.26;  Surgeon: Galen Manila, MD;  Location: [facility];  Service: Ophthalmology;  Laterality: Left;   CATARACT EXTRACTION W/PHACO Right 04/26/2019   Procedure: CATARACT EXTRACTION PHACO AND INTRAOCULAR LENS PLACEMENT (IOC) RIGHT 6.83 00:46.0;  Surgeon: Galen Manila, MD;  Location: [facility];  Service: Ophthalmology;  Laterality: Right;   CHOLECYSTECTOMY     COLONOSCOPY  2017   FOOT SURGERY Right    IVC FILTER INSERTION N/A 07/08/2022   Procedure: IVC FILTER INSERTION;  Surgeon: Renford Dills, MD;  Location: [facility] INVASIVE CV LAB;  Service: Cardiovascular;  Laterality: N/A;   TOTAL KNEE ARTHROPLASTY Right 07/21/2022   Procedure: TOTAL KNEE ARTHROPLASTY;  Surgeon: Reinaldo Berber, MD;  Location: [facility] ORS;  Service: Orthopedics;  Laterality: Right;    Social History Social History   Tobacco Use   Smoking status: Never   Smokeless tobacco: Never  Vaping Use   Vaping Use: Never used  Substance Use Topics   Alcohol use: No   Drug use: No    Family History Family History  Problem Relation Age of Onset   Stroke Mother    CAD Father    Colon cancer Other        paternal great grandfather   Breast cancer Neg Hx     Allergies  Allergen Reactions   Antihistamines, Chlorpheniramine-Type Other (See Comments)    "think crazy thoughts"    Ciprofloxacin Other (See Comments)    Patient cannot remember reaction   Dristan Cold [Chlorphen-Pe-Acetaminophen] Other (See Comments)    "think crazy thoughts"   Ginger Diarrhea   Nitrofurantoin Nausea Only     REVIEW OF SYSTEMS (Negative unless checked)  Constitutional: [] Weight loss  [] Fever  [] Chills Cardiac: [] Chest pain   [] Chest pressure   [] Palpitations   [] Shortness of breath when laying flat   [] Shortness of breath with exertion. Vascular:  [] Pain in legs with walking   [x] Pain in legs at rest  [] History of DVT   [] Phlebitis   [x] Swelling in legs   [] Varicose veins   [] Non-healing ulcers Pulmonary:   [] Uses home oxygen   [] Productive cough   [] Hemoptysis   [] Wheeze  [] COPD   [] Asthma Neurologic:  [] Dizziness   [] Seizures   [] History of stroke   [] History of TIA  [] Aphasia   [] Vissual changes   [] Weakness or numbness in arm   [] Weakness or numbness in leg Musculoskeletal:   [] Joint swelling   [] Joint pain   [] Low back pain Hematologic:  [] Easy bruising  [] Easy bleeding   [] Hypercoagulable state   [] Anemic Gastrointestinal:  [] Diarrhea   [] Vomiting  [] Gastroesophageal reflux/heartburn   [] Difficulty swallowing. Genitourinary:  [] Chronic kidney disease   [] Difficult urination  [] Frequent urination   [] Blood in urine Skin:  [] Rashes   [] Ulcers  Psychological:  [] History of anxiety   []  History of major depression.  Physical Examination  There were no vitals filed for this visit. There is no height or weight on file to calculate BMI. Gen: WD/WN, NAD Head: [place]/AT, No temporalis wasting.  Ear/Nose/Throat: Hearing grossly intact, nares w/o erythema or drainage, pinna without lesions Eyes: PER, EOMI, sclera nonicteric.  Neck: Supple, no gross masses.  No JVD.  Pulmonary:  Good air movement, no audible wheezing, no use of accessory muscles.  Cardiac: RRR, precordium not hyperdynamic. Vascular:  scattered varicosities present bilaterally.  Moderate venous stasis changes to the legs  bilaterally.  2+ soft pitting edema. CEAP [id number]   Vessel Right Left  Radial Palpable Palpable  Gastrointestinal: soft, non-distended. No guarding/no peritoneal signs.  Musculoskeletal: M/S 5/5 throughout.  No deformity.  Neurologic: CN 2-12 intact. Pain and light touch intact in extremities.  Symmetrical.  Speech is fluent. Motor exam as listed above. Psychiatric: Judgment intact, Mood & affect appropriate for pt's clinical situation. Dermatologic: Venous rashes no ulcers noted.  No changes consistent with cellulitis. Lymph : No lichenification or skin changes of chronic lymphedema.  CBC Lab Results  Component Value Date   WBC 11.2 (H) 07/22/2022   HGB 10.9 (L) 07/22/2022   HCT 33.0 (L) 07/22/2022   MCV 92.7 07/22/2022   PLT 229 07/22/2022    BMET    Component Value Date/Time   NA 135 07/22/2022 0707   K 4.3 07/22/2022 0707  CL 109 07/22/2022 0707   CO2 22 07/22/2022 0707   GLUCOSE 143 (H) 07/22/2022 0707   BUN 31 (H) 07/22/2022 0707   CREATININE 1.09 (H) 07/22/2022 0707   CALCIUM 8.4 (L) 07/22/2022 0707   GFRNONAA 52 (L) 07/22/2022 0707   GFRAA >60 04/16/2015 0532   CrCl cannot be calculated (Patient's most recent lab result is older than the maximum 21 days allowed.).  COAG No results found for: "INR", "PROTIME"  Radiology VAS [place] LOWER EXTREMITY VENOUS (DVT)  Result Date: 09/03/2022  Lower Venous DVT Study Patient Name:  April [name]  Date of Exam:   09/01/2022 Medical Rec #: [id number]             Accession #:    [id number] Date of Birth: [birth year]              Patient Gender: F Patient Age:   1 years Exam Location:  [facility] Procedure:      VAS [place] LOWER EXTREMITY VENOUS (DVT) Referring Phys: [name] --------------------------------------------------------------------------------  Indications: S/p right knee surgery.  Risk Factors: Many years ago. Performing Technologist: Salvadore Farber RVT  Examination Guidelines: A complete evaluation  includes B-mode imaging, spectral Doppler, color Doppler, and power Doppler as needed of all accessible portions of each vessel. Bilateral testing is considered an integral part of a complete examination. Limited examinations for reoccurring indications may be performed as noted. The reflux portion of the exam is performed with the patient in reverse Trendelenburg.  +---------+---------------+---------+-----------+----------+--------------+ RIGHT    CompressibilityPhasicitySpontaneityPropertiesThrombus Aging +---------+---------------+---------+-----------+----------+--------------+ CFV      Full           Yes                                          +---------+---------------+---------+-----------+----------+--------------+ SFJ      Full           Yes                                          +---------+---------------+---------+-----------+----------+--------------+ FV Prox  Full           Yes                                          +---------+---------------+---------+-----------+----------+--------------+ FV Mid   Full           Yes                                          +---------+---------------+---------+-----------+----------+--------------+ FV DistalFull           Yes                                          +---------+---------------+---------+-----------+----------+--------------+ PFV      Full           Yes                                          +---------+---------------+---------+-----------+----------+--------------+  POP      Full           Yes                                          +---------+---------------+---------+-----------+----------+--------------+ PTV      Full           Yes                                          +---------+---------------+---------+-----------+----------+--------------+ PERO     Full           Yes                                           +---------+---------------+---------+-----------+----------+--------------+ Gastroc  Full           Yes                                          +---------+---------------+---------+-----------+----------+--------------+ GSV      Full           Yes                                          +---------+---------------+---------+-----------+----------+--------------+ SSV      Full           Yes                                          +---------+---------------+---------+-----------+----------+--------------+   +---------+---------------+---------+-----------+----------+--------------+ LEFT     CompressibilityPhasicitySpontaneityPropertiesThrombus Aging +---------+---------------+---------+-----------+----------+--------------+ CFV      Full           Yes                                          +---------+---------------+---------+-----------+----------+--------------+ SFJ      Full           Yes                                          +---------+---------------+---------+-----------+----------+--------------+ FV Prox  Full           Yes                                          +---------+---------------+---------+-----------+----------+--------------+ FV Mid   Full           Yes                                          +---------+---------------+---------+-----------+----------+--------------+  FV DistalFull           Yes                                          +---------+---------------+---------+-----------+----------+--------------+ PFV      Full           Yes                                          +---------+---------------+---------+-----------+----------+--------------+ POP      Full           Yes                                          +---------+---------------+---------+-----------+----------+--------------+ PTV      Full           Yes                                           +---------+---------------+---------+-----------+----------+--------------+ PERO     Full           Yes                                          +---------+---------------+---------+-----------+----------+--------------+ Gastroc  Full           Yes                                          +---------+---------------+---------+-----------+----------+--------------+ GSV      Full           Yes                                          +---------+---------------+---------+-----------+----------+--------------+ SSV      Full           Yes                                          +---------+---------------+---------+-----------+----------+--------------+     Summary: BILATERAL: - No evidence of deep vein thrombosis seen in the lower extremities, bilaterally. - No evidence of superficial venous thrombosis in the lower extremities, bilaterally. -No evidence of popliteal cyst, bilaterally.   *See table(s) above for measurements and observations. Electronically signed by [name] MD on 09/03/2022 at 10:14:01 AM.    Final      Assessment/Plan 1. Other pulmonary embolism without acute cor pulmonale, unspecified chronicity (HCC) The patient has done well with her knee surgery.  She is now beyond the point of increased risk for thrombosis and has done well with her physical therapy.  IVC filter is no longer indicated.  IVC filter will be removed.  The risks and benefits were reviewed including the  possibility that the filter cannot be retrieved.  All questions were answered.  Patient agrees to move forward with filter retrieval.   2. Mixed hyperlipidemia Continue statin as ordered and reviewed, no changes at this time   [name], MD  09/23/2022 9:00 AM

## 2022-09-23 NOTE — H&P (View-Only) (Signed)
       MRN : [id number]  April [name] is a 76 y.o. (08/10/1945) female who presents with chief complaint of legs hurt and swell.  History of Present Illness:   The patient presents today to [facility] for removal of her IVC filter.  IVC filter was placed July 08, 2022.  On July 21, 2022 she underwent right total knee arthroplasty.  She has done well and is now time to remove the filter as the potential for perioperative thrombosis is now minimal.  IVC filter was placed prior to her orthopedic surgery given her history of PE which was identified in December 2016 by CT angiogram of the chest and was treated with anticoagulation.  Duplex ultrasound of bilateral lower extremities dated 04/15/2015 was negative for DVT.  The presenting symptoms were shortness of breath and pleuritic chest pain.   Recent orthopedic evaluation at [facility] demonstrates her right knee is bone-on-bone and right total knee replacement has been recommended.   The patient has not been using compression therapy at this point.   No recent episodes of SOB or pleuritic chest pains.  No cough or hemoptysis.   No blood per rectum or blood in any sputum.  No excessive bruising per the patient.    No recent shortening of the patient's walking distance or new symptoms consistent with claudication.  No history of rest pain symptoms. No new ulcers or wounds of the lower extremities have occurred.   The patient denies amaurosis fugax or recent TIA symptoms. There are no recent neurological changes noted. No recent episodes of angina or shortness of breath documented.   No outpatient medications have been marked as taking for the 09/23/22 encounter (Hospital Encounter).    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  Timothy Finnegan, MD   Pulmonary embolism (HCC) 2016   Pulmonary embolism (HCC) 2016   Vertigo 1990   "inner ear"    Past Surgical History:  Procedure Laterality Date   ABDOMINAL HYSTERECTOMY     APPENDECTOMY     BREAST BIOPSY Right 04/30/2020   11:00 5cmfn Qmarker-benign, 11:00 8 cmfn twisted X marker-benign   CARDIAC CATHETERIZATION  2005   CATARACT EXTRACTION W/PHACO Left 02/08/2019   Procedure: CATARACT EXTRACTION PHACO AND INTRAOCULAR LENS PLACEMENT (IOC) LEFT 00:40.0  20.6%  8.26;  Surgeon: Porfilio, William, MD;  Location: [facility];  Service: Ophthalmology;  Laterality: Left;   CATARACT EXTRACTION W/PHACO Right 04/26/2019   Procedure: CATARACT EXTRACTION PHACO AND INTRAOCULAR LENS PLACEMENT (IOC) RIGHT 6.83 00:46.0;  Surgeon: Porfilio, William, MD;  Location: [facility];  Service: Ophthalmology;  Laterality: Right;   CHOLECYSTECTOMY     COLONOSCOPY  2017   FOOT SURGERY Right    IVC FILTER INSERTION N/A 07/08/2022   Procedure: IVC FILTER INSERTION;  Surgeon: Lanessa Shill G, MD;  Location: [facility];  Service: Cardiovascular;  Laterality: N/A;   TOTAL KNEE ARTHROPLASTY Right 07/21/2022   Procedure: TOTAL KNEE ARTHROPLASTY;  Surgeon: Aberman, Zachary, MD;  Location: [facility];  Service: Orthopedics;    Laterality: Right;    Social History Social History   Tobacco Use   Smoking status: Never   Smokeless tobacco: Never  Vaping Use   Vaping Use: Never used  Substance Use Topics   Alcohol use: No   Drug use: No    Family History Family History  Problem Relation Age of Onset   Stroke Mother    CAD Father    Colon cancer Other        paternal great grandfather   Breast cancer Neg Hx     Allergies  Allergen Reactions   Antihistamines, Chlorpheniramine-Type Other (See Comments)    "think crazy thoughts"    Ciprofloxacin Other (See Comments)    Patient cannot remember reaction   Dristan Cold [Chlorphen-Pe-Acetaminophen] Other (See Comments)    "think crazy thoughts"   Ginger Diarrhea   Nitrofurantoin Nausea Only     REVIEW OF SYSTEMS (Negative unless checked)  Constitutional: []Weight loss  []Fever  []Chills Cardiac: []Chest pain   []Chest pressure   []Palpitations   []Shortness of breath when laying flat   []Shortness of breath with exertion. Vascular:  []Pain in legs with walking   [x]Pain in legs at rest  []History of DVT   []Phlebitis   [x]Swelling in legs   []Varicose veins   []Non-healing ulcers Pulmonary:   []Uses home oxygen   []Productive cough   []Hemoptysis   []Wheeze  []COPD   []Asthma Neurologic:  []Dizziness   []Seizures   []History of stroke   []History of TIA  []Aphasia   []Vissual changes   []Weakness or numbness in arm   []Weakness or numbness in leg Musculoskeletal:   []Joint swelling   []Joint pain   []Low back pain Hematologic:  []Easy bruising  []Easy bleeding   []Hypercoagulable state   []Anemic Gastrointestinal:  []Diarrhea   []Vomiting  []Gastroesophageal reflux/heartburn   []Difficulty swallowing. Genitourinary:  []Chronic kidney disease   []Difficult urination  []Frequent urination   []Blood in urine Skin:  []Rashes   []Ulcers  Psychological:  []History of anxiety   [] History of major depression.  Physical Examination  There were no vitals filed for this visit. There is no height or weight on file to calculate BMI. Gen: WD/WN, NAD Head: [place]/AT, No temporalis wasting.  Ear/Nose/Throat: Hearing grossly intact, nares w/o erythema or drainage, pinna without lesions Eyes: PER, EOMI, sclera nonicteric.  Neck: Supple, no gross masses.  No JVD.  Pulmonary:  Good air movement, no audible wheezing, no use of accessory muscles.  Cardiac: RRR, precordium not hyperdynamic. Vascular:  scattered varicosities present bilaterally.  Moderate venous stasis changes to the legs  bilaterally.  2+ soft pitting edema. CEAP C4sEpAsPr   Vessel Right Left  Radial Palpable Palpable  Gastrointestinal: soft, non-distended. No guarding/no peritoneal signs.  Musculoskeletal: M/S 5/5 throughout.  No deformity.  Neurologic: CN 2-12 intact. Pain and light touch intact in extremities.  Symmetrical.  Speech is fluent. Motor exam as listed above. Psychiatric: Judgment intact, Mood & affect appropriate for pt's clinical situation. Dermatologic: Venous rashes no ulcers noted.  No changes consistent with cellulitis. Lymph : No lichenification or skin changes of chronic lymphedema.  CBC Lab Results  Component Value Date   WBC 11.2 (H) 07/22/2022   HGB 10.9 (L) 07/22/2022   HCT 33.0 (L) 07/22/2022   MCV 92.7 07/22/2022   PLT 229 07/22/2022    BMET    Component Value Date/Time   NA 135 07/22/2022 0707   K 4.3 07/22/2022 0707     CL 109 07/22/2022 0707   CO2 22 07/22/2022 0707   GLUCOSE 143 (H) 07/22/2022 0707   BUN 31 (H) 07/22/2022 0707   CREATININE 1.09 (H) 07/22/2022 0707   CALCIUM 8.4 (L) 07/22/2022 0707   GFRNONAA 52 (L) 07/22/2022 0707   GFRAA >60 04/16/2015 0532   CrCl cannot be calculated (Patient's most recent lab result is older than the maximum 21 days allowed.).  COAG No results found for: "INR", "PROTIME"  Radiology VAS US LOWER EXTREMITY VENOUS (DVT)  Result Date: 09/03/2022  Lower Venous DVT Study Patient Name:  April [name]  Date of Exam:   09/01/2022 Medical Rec #: [id number]             Accession #:    [id number] Date of Birth: 10/02/1945              Patient Gender: F Patient Age:   76 years Exam Location:  [facility] Procedure:      VAS US LOWER EXTREMITY VENOUS (DVT) Referring Phys: Harrel Ferrone --------------------------------------------------------------------------------  Indications: S/p right knee surgery.  Risk Factors: Many years ago. Performing Technologist: Terry Knight RVT  Examination Guidelines: A complete evaluation  includes B-mode imaging, spectral Doppler, color Doppler, and power Doppler as needed of all accessible portions of each vessel. Bilateral testing is considered an integral part of a complete examination. Limited examinations for reoccurring indications may be performed as noted. The reflux portion of the exam is performed with the patient in reverse Trendelenburg.  +---------+---------------+---------+-----------+----------+--------------+ RIGHT    CompressibilityPhasicitySpontaneityPropertiesThrombus Aging +---------+---------------+---------+-----------+----------+--------------+ CFV      Full           Yes                                          +---------+---------------+---------+-----------+----------+--------------+ SFJ      Full           Yes                                          +---------+---------------+---------+-----------+----------+--------------+ FV Prox  Full           Yes                                          +---------+---------------+---------+-----------+----------+--------------+ FV Mid   Full           Yes                                          +---------+---------------+---------+-----------+----------+--------------+ FV DistalFull           Yes                                          +---------+---------------+---------+-----------+----------+--------------+ PFV      Full           Yes                                          +---------+---------------+---------+-----------+----------+--------------+   POP      Full           Yes                                          +---------+---------------+---------+-----------+----------+--------------+ PTV      Full           Yes                                          +---------+---------------+---------+-----------+----------+--------------+ PERO     Full           Yes                                           +---------+---------------+---------+-----------+----------+--------------+ Gastroc  Full           Yes                                          +---------+---------------+---------+-----------+----------+--------------+ GSV      Full           Yes                                          +---------+---------------+---------+-----------+----------+--------------+ SSV      Full           Yes                                          +---------+---------------+---------+-----------+----------+--------------+   +---------+---------------+---------+-----------+----------+--------------+ LEFT     CompressibilityPhasicitySpontaneityPropertiesThrombus Aging +---------+---------------+---------+-----------+----------+--------------+ CFV      Full           Yes                                          +---------+---------------+---------+-----------+----------+--------------+ SFJ      Full           Yes                                          +---------+---------------+---------+-----------+----------+--------------+ FV Prox  Full           Yes                                          +---------+---------------+---------+-----------+----------+--------------+ FV Mid   Full           Yes                                          +---------+---------------+---------+-----------+----------+--------------+   FV DistalFull           Yes                                          +---------+---------------+---------+-----------+----------+--------------+ PFV      Full           Yes                                          +---------+---------------+---------+-----------+----------+--------------+ POP      Full           Yes                                          +---------+---------------+---------+-----------+----------+--------------+ PTV      Full           Yes                                           +---------+---------------+---------+-----------+----------+--------------+ PERO     Full           Yes                                          +---------+---------------+---------+-----------+----------+--------------+ Gastroc  Full           Yes                                          +---------+---------------+---------+-----------+----------+--------------+ GSV      Full           Yes                                          +---------+---------------+---------+-----------+----------+--------------+ SSV      Full           Yes                                          +---------+---------------+---------+-----------+----------+--------------+     Summary: BILATERAL: - No evidence of deep vein thrombosis seen in the lower extremities, bilaterally. - No evidence of superficial venous thrombosis in the lower extremities, bilaterally. -No evidence of popliteal cyst, bilaterally.   *See table(s) above for measurements and observations. Electronically signed by Trashawn Oquendo MD on 09/03/2022 at 10:14:01 AM.    Final      Assessment/Plan 1. Other pulmonary embolism without acute cor pulmonale, unspecified chronicity (HCC) The patient has done well with her knee surgery.  She is now beyond the point of increased risk for thrombosis and has done well with her physical therapy.  IVC filter is no longer indicated.  IVC filter will be removed.  The risks and benefits were reviewed including the   possibility that the filter cannot be retrieved.  All questions were answered.  Patient agrees to move forward with filter retrieval.   2. Mixed hyperlipidemia Continue statin as ordered and reviewed, no changes at this time   Placida Cambre, MD  09/23/2022 9:00 AM   

## 2022-09-24 ENCOUNTER — Encounter: Payer: Self-pay | Admitting: Vascular Surgery

## 2022-09-26 ENCOUNTER — Encounter: Payer: Self-pay | Admitting: Vascular Surgery

## 2022-10-16 ENCOUNTER — Encounter (INDEPENDENT_AMBULATORY_CARE_PROVIDER_SITE_OTHER): Payer: Self-pay | Admitting: Vascular Surgery

## 2022-10-16 ENCOUNTER — Ambulatory Visit (INDEPENDENT_AMBULATORY_CARE_PROVIDER_SITE_OTHER): Payer: HMO | Admitting: Vascular Surgery

## 2022-10-16 VITALS — BP 118/73 | HR 58 | Resp 18 | Ht 67.0 in | Wt 191.2 lb

## 2022-10-16 DIAGNOSIS — D6852 Prothrombin gene mutation: Secondary | ICD-10-CM

## 2022-10-16 DIAGNOSIS — Z86711 Personal history of pulmonary embolism: Secondary | ICD-10-CM

## 2022-10-16 DIAGNOSIS — I25119 Atherosclerotic heart disease of native coronary artery with unspecified angina pectoris: Secondary | ICD-10-CM

## 2022-10-16 DIAGNOSIS — E782 Mixed hyperlipidemia: Secondary | ICD-10-CM | POA: Diagnosis not present

## 2022-10-16 DIAGNOSIS — I10 Essential (primary) hypertension: Secondary | ICD-10-CM

## 2022-10-16 NOTE — Progress Notes (Signed)
MRN : [id number]  April [name] is a 77 y.o. (June 18, 1945) female who presents with chief complaint of legs hurt and swell.  History of Present Illness:   Patient returns to the office today for follow-up regarding her IVC filter.  Attempts at removing the IVC filter on May 28 were not successful, he is becoming more posteriorly.   IVC filter was placed July 08, 2022.  On July 21, 2022 she underwent right total knee arthroplasty.  She has done well and is now time to remove the filter as the potential for perioperative thrombosis is now minimal.  IVC filter was placed prior to her orthopedic surgery given her history of PE which was identified in December 2016 by CT angiogram of the chest and was treated with anticoagulation.  Duplex ultrasound of bilateral lower extremities dated 04/15/2015 was negative for DVT.  The presenting symptoms were shortness of breath and pleuritic chest pain.   Recent orthopedic evaluation at [facility] clinic demonstrates her right knee is bone-on-bone and right total knee replacement has been recommended.   The patient has not been using compression therapy at this point.   No recent episodes of SOB or pleuritic chest pains.  No cough or hemoptysis.   No blood per rectum or blood in any sputum.  No excessive bruising per the patient.    No recent shortening of the patient's walking distance or new symptoms consistent with claudication.  No history of rest pain symptoms. No new ulcers or wounds of the lower extremities have occurred.    Current Meds  Medication Sig   acetaminophen (TYLENOL) 500 MG tablet Take 2 tablets (1,000 mg total) by mouth every 8 (eight) hours. (Patient taking differently: Take 1,000 mg by mouth as needed.)   aspirin EC 81 MG tablet Take 81 mg by mouth.   atenolol (TENORMIN) 25 MG tablet Take 1 tablet by mouth daily.   atorvastatin (LIPITOR) 40 MG tablet Take 40 mg by mouth daily.   calcium citrate-vitamin D (CITRACAL+D)  315-200 MG-UNIT tablet Take 1 tablet by mouth 2 (two) times daily.   Cholecalciferol (VITAMIN D3) 2000 UNITS capsule Take 1 capsule by mouth daily.   EPINEPHrine (EPIPEN 2-PAK) 0.3 mg/0.3 mL IJ SOAJ injection Inject 0.3 mLs (0.3 mg total) into the muscle as needed for anaphylaxis.   Homeopathic Products (FRANKINCENSE UPLIFTING) OIL Inhale into the lungs.   Turmeric (QC TUMERIC COMPLEX PO) Take 1,000 mg by mouth daily at 2 PM.    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  Gerarda Fraction, MD   Pulmonary embolism (HCC) 2016   Pulmonary embolism (HCC) 2016   Vertigo 1990   "inner ear"    Past Surgical History:  Procedure Laterality Date   ABDOMINAL HYSTERECTOMY     APPENDECTOMY     BREAST BIOPSY Right 04/30/2020   11:00 5cmfn Qmarker-benign, 11:00 8 cmfn twisted X marker-benign   CARDIAC CATHETERIZATION  2005   CATARACT EXTRACTION W/PHACO Left 02/08/2019   Procedure: CATARACT EXTRACTION PHACO AND INTRAOCULAR  LENS PLACEMENT (IOC) LEFT 00:40.0  20.6%  8.26;  Surgeon: Galen Manila, MD;  Location: [facility];  Service: Ophthalmology;  Laterality: Left;   CATARACT EXTRACTION W/PHACO Right 04/26/2019   Procedure: CATARACT EXTRACTION PHACO AND INTRAOCULAR LENS PLACEMENT (IOC) RIGHT 6.83 00:46.0;  Surgeon: Galen Manila, MD;  Location: [facility];  Service: Ophthalmology;  Laterality: Right;   CHOLECYSTECTOMY     COLONOSCOPY  2017   FOOT SURGERY Right    IVC FILTER INSERTION N/A 07/08/2022   Procedure: IVC FILTER INSERTION;  Surgeon: Renford Dills, MD;  Location: [facility];  Service: Cardiovascular;  Laterality: N/A;    IVC FILTER REMOVAL N/A 09/23/2022   Procedure: IVC FILTER REMOVAL;  Surgeon: Renford Dills, MD;  Location: [facility];  Service: Cardiovascular;  Laterality: N/A;   TOTAL KNEE ARTHROPLASTY Right 07/21/2022   Procedure: TOTAL KNEE ARTHROPLASTY;  Surgeon: Reinaldo Berber, MD;  Location: [facility];  Service: Orthopedics;  Laterality: Right;    Social History Social History   Tobacco Use   Smoking status: Never   Smokeless tobacco: Never  Vaping Use   Vaping Use: Never used  Substance Use Topics   Alcohol use: No   Drug use: No    Family History Family History  Problem Relation Age of Onset   Stroke Mother    CAD Father    Colon cancer Other        paternal great grandfather   Breast cancer Neg Hx     Allergies  Allergen Reactions   Antihistamines, Chlorpheniramine-Type Other (See Comments)    "think crazy thoughts"   Ciprofloxacin Other (See Comments)    Patient cannot remember reaction   Dristan Cold [Chlorphen-Pe-Acetaminophen] Other (See Comments)    "think crazy thoughts"   Ginger Diarrhea   Nitrofurantoin Nausea Only   Celecoxib Rash     REVIEW OF SYSTEMS (Negative unless checked)  Constitutional: [] Weight loss  [] Fever  [] Chills Cardiac: [] Chest pain   [] Chest pressure   [] Palpitations   [] Shortness of breath when laying flat   [] Shortness of breath with exertion. Vascular:  [] Pain in legs with walking   [x] Pain in legs at rest  [] History of DVT   [] Phlebitis   [x] Swelling in legs   [] Varicose veins   [] Non-healing ulcers Pulmonary:   [] Uses home oxygen   [] Productive cough   [] Hemoptysis   [] Wheeze  [] COPD   [] Asthma Neurologic:  [] Dizziness   [] Seizures   [] History of stroke   [] History of TIA  [] Aphasia   [] Vissual changes   [] Weakness or numbness in arm   [] Weakness or numbness in leg Musculoskeletal:   [] Joint swelling   [] Joint pain   [] Low back pain Hematologic:  [] Easy bruising  [] Easy bleeding   [] Hypercoagulable state    [] Anemic Gastrointestinal:  [] Diarrhea   [] Vomiting  [] Gastroesophageal reflux/heartburn   [] Difficulty swallowing. Genitourinary:  [] Chronic kidney disease   [] Difficult urination  [] Frequent urination   [] Blood in urine Skin:  [] Rashes   [] Ulcers  Psychological:  [] History of anxiety   []  History of major depression.  Physical Examination  Vitals:   10/16/22 1341  BP: 118/73  Pulse: (!) 58  Resp: 18  Weight: 191 lb 3.2 oz (86.7 kg)  Height: 5\' 7"  (1.702 m)   Body mass index is 29.95 kg/m. Gen: WD/WN, NAD Head: /AT, No temporalis wasting.  Ear/Nose/Throat: Hearing grossly intact, nares w/o erythema or drainage, pinna without lesions Eyes: PER, EOMI, sclera nonicteric.  Neck: Supple,  no gross masses.  No JVD.  Pulmonary:  Good air movement, no audible wheezing, no use of accessory muscles.  Cardiac: RRR, precordium not hyperdynamic. Vascular:  scattered varicosities present bilaterally.  Moderate venous stasis changes to the legs bilaterally.  1-2+ soft pitting edema. CEAP C4sEpAsPr   Vessel Right Left  Radial Palpable Palpable  Gastrointestinal: soft, non-distended. No guarding/no peritoneal signs.  Musculoskeletal: M/S 5/5 throughout.  No deformity.  Neurologic: CN 2-12 intact. Pain and light touch intact in extremities.  Symmetrical.  Speech is fluent. Motor exam as listed above. Psychiatric: Judgment intact, Mood & affect appropriate for pt's clinical situation. Dermatologic: Venous rashes no ulcers noted.  No changes consistent with cellulitis. Lymph : No lichenification or skin changes of chronic lymphedema.  CBC Lab Results  Component Value Date   WBC 11.2 (H) 07/22/2022   HGB 10.9 (L) 07/22/2022   HCT 33.0 (L) 07/22/2022   MCV 92.7 07/22/2022   PLT 229 07/22/2022    BMET    Component Value Date/Time   NA 135 07/22/2022 0707   K 4.3 07/22/2022 0707   CL 109 07/22/2022 0707   CO2 22 07/22/2022 0707   GLUCOSE 143 (H) 07/22/2022 0707   BUN 31 (H) 07/22/2022  0707   CREATININE 1.09 (H) 07/22/2022 0707   CALCIUM 8.4 (L) 07/22/2022 0707   GFRNONAA 52 (L) 07/22/2022 0707   GFRAA >60 04/16/2015 0532   CrCl cannot be calculated (Patient's most recent lab result is older than the maximum 21 days allowed.).  COAG No results found for: "INR", "PROTIME"  Radiology PERIPHERAL VASCULAR CATHETERIZATION  Result Date: 09/23/2022 See surgical note for result.    Assessment/Plan 1. History of pulmonary embolus (PE) The patient has done well with her knee surgery.  She is now beyond the point of increased risk for thrombosis and has done well with her physical therapy.  IVC filter is no longer indicated.   IVC filter will be removed.  At the initial attempt she was found to have the hook embedded in the posterior wall of her cava.  Today we discussed advanced techniques for filter removal including use of forceps as well as wires and narrowing the filter below the hope to free it from the wall of the vein.  We also discussed access from the femoral approach.  We discussed the need for a larger IVs that you would have to working accesses simultaneously.  Patient also raised the question of general anesthesia.  The risks and benefits were reviewed including the possibility that the filter cannot be retrieved.  All questions were answered.  Patient agrees to move forward with filter retrieval.  2. Prothrombin gene mutation [facility]) She remains off of her Eliquis for her prothrombin gene mutation. Her PE in 2017 was considered prompted due to illness and dehydration. She continues to take her aspirin daily.   3. Mixed hyperlipidemia Continue statin as ordered and reviewed, no changes at this time  4. Coronary artery disease involving native coronary artery of native heart with angina pectoris (HCC) Continue cardiac and antihypertensive medications as already ordered and reviewed, no changes at this time.  Continue statin as ordered and reviewed, no changes at  this time  Nitrates PRN for chest pain  5. Essential hypertension Continue antihypertensive medications as already ordered, these medications have been reviewed and there are no changes at this time.    [name], MD  10/16/2022 2:30 PM

## 2022-10-22 ENCOUNTER — Other Ambulatory Visit: Payer: Self-pay

## 2022-10-22 ENCOUNTER — Inpatient Hospital Stay: Payer: HMO

## 2022-10-22 ENCOUNTER — Emergency Department: Payer: HMO

## 2022-10-22 ENCOUNTER — Inpatient Hospital Stay
Admission: EM | Admit: 2022-10-22 | Discharge: 2022-10-24 | DRG: 312 | Disposition: A | Discharge status: Home / Self Care | Payer: HMO | Attending: Osteopathic Medicine | Admitting: Osteopathic Medicine

## 2022-10-22 DIAGNOSIS — Z91018 Allergy to other foods: Secondary | ICD-10-CM | POA: Diagnosis not present

## 2022-10-22 DIAGNOSIS — Z96651 Presence of right artificial knee joint: Secondary | ICD-10-CM | POA: Diagnosis present

## 2022-10-22 DIAGNOSIS — Y92012 Bathroom of single-family (private) house as the place of occurrence of the external cause: Secondary | ICD-10-CM

## 2022-10-22 DIAGNOSIS — Z8249 Family history of ischemic heart disease and other diseases of the circulatory system: Secondary | ICD-10-CM

## 2022-10-22 DIAGNOSIS — Z90710 Acquired absence of both cervix and uterus: Secondary | ICD-10-CM

## 2022-10-22 DIAGNOSIS — D6852 Prothrombin gene mutation: Secondary | ICD-10-CM | POA: Diagnosis present

## 2022-10-22 DIAGNOSIS — I10 Essential (primary) hypertension: Secondary | ICD-10-CM | POA: Diagnosis present

## 2022-10-22 DIAGNOSIS — Z86711 Personal history of pulmonary embolism: Secondary | ICD-10-CM | POA: Diagnosis present

## 2022-10-22 DIAGNOSIS — Z881 Allergy status to other antibiotic agents status: Secondary | ICD-10-CM

## 2022-10-22 DIAGNOSIS — I252 Old myocardial infarction: Secondary | ICD-10-CM | POA: Diagnosis not present

## 2022-10-22 DIAGNOSIS — Z823 Family history of stroke: Secondary | ICD-10-CM | POA: Diagnosis not present

## 2022-10-22 DIAGNOSIS — Z7982 Long term (current) use of aspirin: Secondary | ICD-10-CM

## 2022-10-22 DIAGNOSIS — Z23 Encounter for immunization: Secondary | ICD-10-CM

## 2022-10-22 DIAGNOSIS — Z888 Allergy status to other drugs, medicaments and biological substances status: Secondary | ICD-10-CM

## 2022-10-22 DIAGNOSIS — Z9049 Acquired absence of other specified parts of digestive tract: Secondary | ICD-10-CM | POA: Diagnosis not present

## 2022-10-22 DIAGNOSIS — M19042 Primary osteoarthritis, left hand: Secondary | ICD-10-CM | POA: Diagnosis present

## 2022-10-22 DIAGNOSIS — S61412A Laceration without foreign body of left hand, initial encounter: Secondary | ICD-10-CM | POA: Diagnosis present

## 2022-10-22 DIAGNOSIS — Z955 Presence of coronary angioplasty implant and graft: Secondary | ICD-10-CM | POA: Diagnosis not present

## 2022-10-22 DIAGNOSIS — Z79899 Other long term (current) drug therapy: Secondary | ICD-10-CM | POA: Diagnosis not present

## 2022-10-22 DIAGNOSIS — Z800 Family history of malignant neoplasm of digestive organs: Secondary | ICD-10-CM | POA: Diagnosis not present

## 2022-10-22 DIAGNOSIS — E785 Hyperlipidemia, unspecified: Secondary | ICD-10-CM | POA: Diagnosis present

## 2022-10-22 DIAGNOSIS — I2510 Atherosclerotic heart disease of native coronary artery without angina pectoris: Secondary | ICD-10-CM | POA: Diagnosis present

## 2022-10-22 DIAGNOSIS — M79643 Pain in unspecified hand: Secondary | ICD-10-CM

## 2022-10-22 DIAGNOSIS — R569 Unspecified convulsions: Secondary | ICD-10-CM | POA: Diagnosis not present

## 2022-10-22 DIAGNOSIS — W1830XA Fall on same level, unspecified, initial encounter: Secondary | ICD-10-CM | POA: Diagnosis present

## 2022-10-22 DIAGNOSIS — R55 Syncope and collapse: Secondary | ICD-10-CM | POA: Diagnosis present

## 2022-10-22 DIAGNOSIS — S022XXA Fracture of nasal bones, initial encounter for closed fracture: Secondary | ICD-10-CM

## 2022-10-22 DIAGNOSIS — Z86718 Personal history of other venous thrombosis and embolism: Secondary | ICD-10-CM | POA: Diagnosis not present

## 2022-10-22 DIAGNOSIS — Z95828 Presence of other vascular implants and grafts: Secondary | ICD-10-CM

## 2022-10-22 LAB — CBC
HCT: 42.9 % (ref 36.0–46.0)
Hemoglobin: 13.4 g/dL (ref 12.0–15.0)
MCH: 29.7 pg (ref 26.0–34.0)
MCHC: 31.2 g/dL (ref 30.0–36.0)
MCV: 95.1 fL (ref 80.0–100.0)
Platelets: 272 10*3/uL (ref 150–400)
RBC: 4.51 MIL/uL (ref 3.87–5.11)
RDW: 12.5 % (ref 11.5–15.5)
WBC: 7.4 10*3/uL (ref 4.0–10.5)
nRBC: 0 % (ref 0.0–0.2)

## 2022-10-22 LAB — BASIC METABOLIC PANEL
Anion gap: 12 (ref 5–15)
BUN: 21 mg/dL (ref 8–23)
CO2: 18 mmol/L — ABNORMAL LOW (ref 22–32)
Calcium: 9.4 mg/dL (ref 8.9–10.3)
Chloride: 106 mmol/L (ref 98–111)
Creatinine, Ser: 0.89 mg/dL (ref 0.44–1.00)
GFR, Estimated: >60 mL/min (ref >60)
Glucose, Bld: 132 mg/dL — ABNORMAL HIGH (ref 70–99)
Potassium: 4.2 mmol/L (ref 3.5–5.1)
Sodium: 136 mmol/L (ref 135–145)

## 2022-10-22 LAB — TSH: TSH: 1.624 u[IU]/mL (ref 0.350–4.500)

## 2022-10-22 LAB — MAGNESIUM: Magnesium: 2 mg/dL (ref 1.7–2.4)

## 2022-10-22 LAB — TROPONIN I (HIGH SENSITIVITY)
Troponin I (High Sensitivity): 5 ng/L (ref <18)
Troponin I (High Sensitivity): 6 ng/L (ref <18)

## 2022-10-22 LAB — D-DIMER, QUANTITATIVE: D-Dimer, Quant: 2.35 ug/mL-FEU — ABNORMAL HIGH (ref 0.00–0.50)

## 2022-10-22 MED ORDER — ACETAMINOPHEN 325 MG PO TABS
650.0000 mg | ORAL_TABLET | Freq: Four times a day (QID) | ORAL | Status: DC | PRN
  Administered 2022-10-23 (×2): 650 mg via ORAL
  Filled 2022-10-22 (×2): qty 2

## 2022-10-22 MED ORDER — MORPHINE SULFATE (PF) 4 MG/ML IV SOLN
4.0000 mg | Freq: Once | INTRAVENOUS | Status: AC
  Administered 2022-10-22: 4 mg via INTRAVENOUS
  Filled 2022-10-22: qty 1

## 2022-10-22 MED ORDER — SODIUM CHLORIDE 0.9 % IV SOLN: INTRAVENOUS | Status: DC

## 2022-10-22 MED ORDER — SODIUM CHLORIDE 0.9% FLUSH
3.0000 mL | Freq: Two times a day (BID) | INTRAVENOUS | Status: DC
  Administered 2022-10-22 – 2022-10-24 (×4): 3 mL via INTRAVENOUS

## 2022-10-22 MED ORDER — ONDANSETRON HCL 4 MG/2ML IJ SOLN
4.0000 mg | Freq: Four times a day (QID) | INTRAMUSCULAR | Status: DC | PRN
  Administered 2022-10-22 – 2022-10-23 (×2): 4 mg via INTRAVENOUS
  Filled 2022-10-22 (×2): qty 2

## 2022-10-22 MED ORDER — LIDOCAINE HCL (PF) 1 % IJ SOLN
10.0000 mL | Freq: Once | INTRAMUSCULAR | Status: AC
  Administered 2022-10-22: 10 mL via INTRADERMAL
  Filled 2022-10-22: qty 10

## 2022-10-22 MED ORDER — ASPIRIN 81 MG PO TBEC
81.0000 mg | DELAYED_RELEASE_TABLET | Freq: Every day | ORAL | Status: DC
  Administered 2022-10-22 – 2022-10-24 (×3): 81 mg via ORAL
  Filled 2022-10-22 (×3): qty 1

## 2022-10-22 MED ORDER — TETANUS-DIPHTH-ACELL PERTUSSIS 5-2.5-18.5 LF-MCG/0.5 IM SUSY
0.5000 mL | PREFILLED_SYRINGE | Freq: Once | INTRAMUSCULAR | Status: AC
  Administered 2022-10-22: 0.5 mL via INTRAMUSCULAR
  Filled 2022-10-22: qty 0.5

## 2022-10-22 MED ORDER — ONDANSETRON HCL 4 MG PO TABS: 4.0000 mg | ORAL_TABLET | Freq: Four times a day (QID) | ORAL | Status: DC | PRN

## 2022-10-22 MED ORDER — FENTANYL CITRATE PF 50 MCG/ML IJ SOSY: 12.5000 ug | PREFILLED_SYRINGE | INTRAMUSCULAR | Status: DC | PRN

## 2022-10-22 MED ORDER — SODIUM CHLORIDE 0.9 % IV BOLUS
1000.0000 mL | Freq: Once | INTRAVENOUS | Status: AC
  Administered 2022-10-22: 1000 mL via INTRAVENOUS

## 2022-10-22 MED ORDER — ACETAMINOPHEN 650 MG RE SUPP: 650.0000 mg | Freq: Four times a day (QID) | RECTAL | Status: DC | PRN

## 2022-10-22 MED ORDER — HYDROCODONE-ACETAMINOPHEN 5-325 MG PO TABS: 1.0000 | ORAL_TABLET | ORAL | Status: DC | PRN

## 2022-10-22 MED ORDER — ATORVASTATIN CALCIUM 20 MG PO TABS
40.0000 mg | ORAL_TABLET | Freq: Every day | ORAL | Status: DC
  Administered 2022-10-22 – 2022-10-24 (×3): 40 mg via ORAL
  Filled 2022-10-22 (×3): qty 2

## 2022-10-22 MED ORDER — ENOXAPARIN SODIUM 40 MG/0.4ML IJ SOSY
40.0000 mg | PREFILLED_SYRINGE | INTRAMUSCULAR | Status: DC
  Administered 2022-10-22: 40 mg via SUBCUTANEOUS
  Filled 2022-10-22: qty 0.4

## 2022-10-22 NOTE — ED Provider Triage Note (Signed)
Emergency Medicine Provider Triage Evaluation Note  April [name] , a 77 y.o. female  was evaluated in triage.  Pt complains of loc, fall, fell on face, head and neck pain, left hand pain, right knee pain.  Review of Systems  Positive:  Negative:   Physical Exam  BP (!) 167/82 (BP Location: Right Arm)   Pulse 64   Temp 98 F (36.7 C) (Oral)   Resp 16   Ht 5\' 7"  (1.702 m)   Wt 85.3 kg   SpO2 100%   BMI 29.44 kg/m  Gen:   Awake, no distress   Resp:  Normal effort  MSK:   Moves extremities without difficulty , left hand in bandage, right knee swollen Other:  Swelling and abrasions noted to face  Medical Decision Making  Medically screening exam initiated at 9:43 AM.  Appropriate orders placed.  Juwana Thoreson was informed that the remainder of the evaluation will be completed by another provider, this initial triage assessment does not replace that evaluation, and the importance of remaining in the ED until their evaluation is complete.  Cts , EKG, labs ordered Tdap ordered Pt placed in ccollar   Faythe Ghee, PA-C 10/22/22 0945

## 2022-10-22 NOTE — ED Triage Notes (Signed)
Pt arrives via ems from home, pt felt lightheaded and nauseated in the bathroom. Pt hit her face on the floor. Pt has laceration to her lip, nosebleed, right knee pain, left hand pain, c-collar applied in triage

## 2022-10-22 NOTE — Procedures (Signed)
Eeg be done tomorrow

## 2022-10-22 NOTE — ED Notes (Signed)
Pt's husband approached this RN and states that the pt is feeling nauseated again, this RN grabbed an emesis bag and took it to the pt who was unresponsive with her head down, and her eyes were deviated to the right, pt pulled into triage and the first nurse notified, pt came too, was pale and alert and states that her nausea was gone at this time, pt able to stand and sit on the stretcher with assistance

## 2022-10-22 NOTE — ED Triage Notes (Signed)
First Nurse Note: Pt via [facility] from home. Pt c/o nausea for the past couple of day. States she was in the bathroom getting read. Pt has a laceration on her L lip, skin tear L hand, R knee and nose was bleeding. Denies blood thinners. Pt is A&Ox4 and NAD 143/73  138 CBG  97% on RA  67 NSR

## 2022-10-22 NOTE — H&P (Addendum)
History and Physical    April [name] MWN:[id number] DOB: [birth year] DOA: 10/22/2022  PCP: April Goodell, MD  Patient coming from: Home  Chief Complaint: Syncope  HPI: April [name] is a 77 y.o. female with medical history significant of CAD, hx MI, PE s/p IVC filter, HTN, HLD who presents after syncopal episode at home.  She was standing in the bathroom, just finished brushing her teeth when she felt sudden nausea.  She called out to her partner, but then passed out.  Partner was at patient's side, states that she had loss of consciousness for about 30 seconds.  When she woke up, she had no episodes of confusion or postictal signs.  EMS was called.  Patient states that she has been feeling relatively well.  She recently underwent right knee surgery for osteoarthritis.  In the emergency department, had another episode where she felt nausea, by the time RN returned with a bag, she was unresponsive again.  This episode also lasted about 30 seconds without postictal signs.  Patient complains of pain especially around her face, left hand as well as right hand.  Denies recent illnesses.  No vomiting.  ED Course: Labs overall unremarkable, troponin negative.  CT head, cervical spine, maxillofacial showed comminuted nasal fracture, without other acute or traumatic finding.  Right knee x-ray negative.  Left hand x-ray revealed severe osteoarthritis.  Review of Systems: As per HPI. Otherwise, all other review of systems reviewed and are negative.   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  Gerarda Fraction, MD   Pulmonary embolism (HCC) 2016   Pulmonary  embolism (HCC) 2016   Vertigo 1990   "inner ear"    Past Surgical History:  Procedure Laterality Date   ABDOMINAL HYSTERECTOMY     APPENDECTOMY     BREAST BIOPSY Right 04/30/2020   11:00 5cmfn Qmarker-benign, 11:00 8 cmfn twisted X marker-benign   CARDIAC CATHETERIZATION  2005   CATARACT EXTRACTION W/PHACO Left 02/08/2019   Procedure: CATARACT EXTRACTION PHACO AND INTRAOCULAR LENS PLACEMENT (IOC) LEFT 00:40.0  20.6%  8.26;  Surgeon: Galen Manila, MD;  Location: [facility];  Service: Ophthalmology;  Laterality: Left;   CATARACT EXTRACTION W/PHACO Right 04/26/2019   Procedure: CATARACT EXTRACTION PHACO AND INTRAOCULAR LENS PLACEMENT (IOC) RIGHT 6.83 00:46.0;  Surgeon: Galen Manila, MD;  Location: [facility];  Service: Ophthalmology;  Laterality: Right;   CHOLECYSTECTOMY     COLONOSCOPY  2017   FOOT SURGERY Right    IVC FILTER INSERTION N/A 07/08/2022   Procedure: IVC FILTER INSERTION;  Surgeon: Renford Dills, MD;  Location: [facility] INVASIVE CV [facility];  Service: Cardiovascular;  Laterality: N/A;   IVC FILTER REMOVAL N/A 09/23/2022   Procedure: IVC FILTER REMOVAL;  Surgeon: Renford Dills, MD;  Location: [facility] INVASIVE CV LAB;  Service: Cardiovascular;  Laterality: N/A;   TOTAL KNEE ARTHROPLASTY Right 07/21/2022   Procedure: TOTAL KNEE ARTHROPLASTY;  Surgeon: Reinaldo Berber, MD;  Location: [facility] ORS;  Service: Orthopedics;  Laterality: Right;     reports that she has  never smoked. She has never used smokeless tobacco. She reports that she does not drink alcohol and does not use drugs.  Allergies  Allergen Reactions   Antihistamines, Chlorpheniramine-Type Other (See Comments)    "think crazy thoughts"   Ciprofloxacin Other (See Comments)    Patient cannot remember reaction   Dristan Cold [Chlorphen-Pe-Acetaminophen] Other (See Comments)    "think crazy thoughts"   Ginger Diarrhea   Nitrofurantoin Nausea Only   Celecoxib Rash    Family History   Problem Relation Age of Onset   Stroke Mother    CAD Father    Colon cancer Other        paternal great grandfather   Breast cancer Neg Hx     Prior to Admission medications   Medication Sig Start Date End Date Taking? Authorizing Provider  acetaminophen (TYLENOL) 500 MG tablet Take 2 tablets (1,000 mg total) by mouth every 8 (eight) hours. Patient taking differently: Take 1,000 mg by mouth as needed. 07/22/22   Evon Slack, PA-C  aspirin EC 81 MG tablet Take 81 mg by mouth. 09/24/05   [provider]  atenolol (TENORMIN) 25 MG tablet Take 1 tablet by mouth daily. 09/22/14   [provider]  atorvastatin (LIPITOR) 40 MG tablet Take 40 mg by mouth daily.    [provider]  calcium citrate-vitamin D (CITRACAL+D) 315-200 MG-UNIT tablet Take 1 tablet by mouth 2 (two) times daily.    [provider]  Cholecalciferol (VITAMIN D3) 2000 UNITS capsule Take 1 capsule by mouth daily.    [provider]  EPINEPHrine (EPIPEN 2-PAK) 0.3 mg/0.3 mL IJ SOAJ injection Inject 0.3 mLs (0.3 mg total) into the muscle as needed for anaphylaxis. 10/31/19   Minna Antis, MD  Homeopathic Products (FRANKINCENSE UPLIFTING) OIL Inhale into the lungs.    [provider]  Turmeric (QC TUMERIC COMPLEX PO) Take 1,000 mg by mouth daily at 2 PM.    [provider]    Physical Exam: Vitals:   10/22/22 1200 10/22/22 1230 10/22/22 1300 10/22/22 1330  BP: (!) 184/64 (!) 178/67 (!) 179/63 (!) 182/65  Pulse: 67 68 71 68  Resp: 10 14 12 20   Temp:      TempSrc:      SpO2: 100% 100% 100% 100%  Weight:      Height:        Constitutional: NAD, calm Eyes: PERRL, lids and conjunctivae normal ENMT: +bruising and swelling of her face, nose  Respiratory: Clear to auscultation bilaterally, no wheezing, no crackles. Normal respiratory effort. No accessory muscle use. No conversational dyspnea  Cardiovascular: Regular rate and rhythm, no murmurs. No extremity  edema.  Abdomen: Soft, nondistended, nontender to palpation. Bowel sounds positive.  Neurologic: Alert and oriented, speech fluent, CN 2-12 grossly intact. No focal deficits.   Psychiatric: Normal judgment and insight. Normal mood and affect   Labs on Admission: I have personally reviewed following labs and imaging studies  CBC: Recent Labs  Lab 10/22/22 0945  WBC 7.4  HGB 13.4  HCT 42.9  MCV 95.1  PLT 272   Basic Metabolic Panel: Recent Labs  Lab 10/22/22 0945  NA 136  K 4.2  CL 106  CO2 18*  GLUCOSE 132*  BUN 21  CREATININE 0.89  CALCIUM 9.4   GFR: Estimated Creatinine Clearance: 59.4 mL/min (by C-G formula based on SCr of 0.89 mg/dL). Liver Function Tests: No results for input(s): "AST", "ALT", "ALKPHOS", "BILITOT", "PROT", "ALBUMIN" in the last 168 hours. No  results for input(s): "LIPASE", "AMYLASE" in the last 168 hours. No results for input(s): "AMMONIA" in the last 168 hours. Coagulation Profile: No results for input(s): "INR", "PROTIME" in the last 168 hours. Cardiac Enzymes: No results for input(s): "CKTOTAL", "CKMB", "CKMBINDEX", "TROPONINI" in the last 168 hours. BNP (last 3 results) No results for input(s): "PROBNP" in the last 8760 hours. HbA1C: No results for input(s): "HGBA1C" in the last 72 hours. CBG: No results for input(s): "GLUCAP" in the last 168 hours. Lipid Profile: No results for input(s): "CHOL", "HDL", "LDLCALC", "TRIG", "CHOLHDL", "LDLDIRECT" in the last 72 hours. Thyroid Function Tests: No results for input(s): "TSH", "T4TOTAL", "FREET4", "T3FREE", "THYROIDAB" in the last 72 hours. Anemia Panel: No results for input(s): "VITAMINB12", "FOLATE", "FERRITIN", "TIBC", "IRON", "RETICCTPCT" in the last 72 hours. Urine analysis:    Component Value Date/Time   COLORURINE YELLOW (A) 07/11/2022 0953   APPEARANCEUR CLEAR (A) 07/11/2022 0953   APPEARANCEUR Clear 12/19/2020 1518   LABSPEC 1.015 07/11/2022 0953   PHURINE 5.0 07/11/2022 0953    GLUCOSEU NEGATIVE 07/11/2022 0953   HGBUR NEGATIVE 07/11/2022 0953   BILIRUBINUR NEGATIVE 07/11/2022 0953   BILIRUBINUR Negative 12/19/2020 1518   KETONESUR NEGATIVE 07/11/2022 0953   PROTEINUR NEGATIVE 07/11/2022 0953   NITRITE NEGATIVE 07/11/2022 0953   LEUKOCYTESUR NEGATIVE 07/11/2022 0953   Sepsis Labs: !!!!!!!!!!!!!!!!!!!!!!!!!!!!!!!!!!!!!!!!!!!! @LABRCNTIP (procalcitonin:4,lacticidven:4) )No results found for this or any previous visit (from the past 240 hour(s)).   Radiological Exams on Admission: CT HEAD WO CONTRAST ( )  Result Date: 10/22/2022 CLINICAL DATA:  Larey Seat in the bathroom with trauma to the head, face and neck. EXAM: CT HEAD WITHOUT CONTRAST CT MAXILLOFACIAL WITHOUT CONTRAST CT CERVICAL SPINE WITHOUT CONTRAST TECHNIQUE: Multidetector CT imaging of the head, cervical spine, and maxillofacial structures were performed using the standard protocol without intravenous contrast. Multiplanar CT image reconstructions of the cervical spine and maxillofacial structures were also generated. RADIATION DOSE REDUCTION: This exam was performed according to the departmental dose-optimization program which includes automated exposure control, adjustment of the mA and/or kV according to patient size and/or use of iterative reconstruction technique. COMPARISON:  None Available. FINDINGS: CT HEAD FINDINGS Brain: Age related volume loss. 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 No sign of acute infarction, mass lesion, hemorrhage, hydrocephalus or extra-axial collection. Vascular: There is atherosclerotic calcification of the major vessels at the base of the brain. Skull: Negative Other: None CT MAXILLOFACIAL FINDINGS Osseous: Comminuted nasal fractures with slight displacement towards the left. Fracture of the nasal septum. Fracture of the nasal spine of the maxilla. No other facial fracture. Orbits: No intraorbital injury. Sinuses: Clear except for some layering fluid in the right  maxillary sinus, likely related to the nasal fractures. Insignificant retention cyst at the right maxillary sinus floor. Soft tissues: Soft tissue swelling in the region of the nose. CT CERVICAL SPINE FINDINGS Alignment: Straightening of the normal cervical lordosis. Mild curvature convex to the left. Skull base and vertebrae: No regional fracture. Soft tissues and spinal canal: No soft tissue neck injury seen. Extensive calcification of the left proximal vertebral artery incidentally noted. Disc levels: The foramen magnum is widely patent. There is ordinary mild osteoarthritis of the C1-2 articulation but no encroachment upon the neural structures. C2-3: Normal C3-4: Bilateral facet osteoarthritis.  No stenosis. C4-5: Facet osteoarthritis on the right. Mild spondylosis. No compressive stenosis. C5-6: Chronic disc degeneration with loss of disc height and endplate osteophytes. Mild bony foraminal narrowing. C6-7: Chronic disc degeneration with loss of disc height and endplate osteophytes. Mild bony  foraminal narrowing. C7-T1: Normal Upper chest: Negative Other: None IMPRESSION: HEAD CT: No acute or traumatic finding. Age related volume loss. 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 MAXILLOFACIAL CT: Comminuted nasal fractures with slight displacement towards the left. Fracture of the nasal septum. Fracture of the nasal spine of the maxilla. No other facial fracture. CERVICAL SPINE CT: No acute or traumatic finding. Chronic degenerative changes as outlined above. Electronically Signed   By: Paulina Fusi M.D.   On: 10/22/2022 11:17   CT Cervical Spine Wo Contrast  Result Date: 10/22/2022 CLINICAL DATA:  Larey Seat in the bathroom with trauma to the head, face and neck. EXAM: CT HEAD WITHOUT CONTRAST CT MAXILLOFACIAL WITHOUT CONTRAST CT CERVICAL SPINE WITHOUT CONTRAST TECHNIQUE: Multidetector CT imaging of the head, cervical spine, and maxillofacial structures were performed using the standard protocol  without intravenous contrast. Multiplanar CT image reconstructions of the cervical spine and maxillofacial structures were also generated. RADIATION DOSE REDUCTION: This exam was performed according to the departmental dose-optimization program which includes automated exposure control, adjustment of the mA and/or kV according to patient size and/or use of iterative reconstruction technique. COMPARISON:  None Available. FINDINGS: CT HEAD FINDINGS Brain: Age related volume loss. 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 No sign of acute infarction, mass lesion, hemorrhage, hydrocephalus or extra-axial collection. Vascular: There is atherosclerotic calcification of the major vessels at the base of the brain. Skull: Negative Other: None CT MAXILLOFACIAL FINDINGS Osseous: Comminuted nasal fractures with slight displacement towards the left. Fracture of the nasal septum. Fracture of the nasal spine of the maxilla. No other facial fracture. Orbits: No intraorbital injury. Sinuses: Clear except for some layering fluid in the right maxillary sinus, likely related to the nasal fractures. Insignificant retention cyst at the right maxillary sinus floor. Soft tissues: Soft tissue swelling in the region of the nose. CT CERVICAL SPINE FINDINGS Alignment: Straightening of the normal cervical lordosis. Mild curvature convex to the left. Skull base and vertebrae: No regional fracture. Soft tissues and spinal canal: No soft tissue neck injury seen. Extensive calcification of the left proximal vertebral artery incidentally noted. Disc levels: The foramen magnum is widely patent. There is ordinary mild osteoarthritis of the C1-2 articulation but no encroachment upon the neural structures. C2-3: Normal C3-4: Bilateral facet osteoarthritis.  No stenosis. C4-5: Facet osteoarthritis on the right. Mild spondylosis. No compressive stenosis. C5-6: Chronic disc degeneration with loss of disc height and endplate osteophytes. Mild  bony foraminal narrowing. C6-7: Chronic disc degeneration with loss of disc height and endplate osteophytes. Mild bony foraminal narrowing. C7-T1: Normal Upper chest: Negative Other: None IMPRESSION: HEAD CT: No acute or traumatic finding. Age related volume loss. 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 MAXILLOFACIAL CT: Comminuted nasal fractures with slight displacement towards the left. Fracture of the nasal septum. Fracture of the nasal spine of the maxilla. No other facial fracture. CERVICAL SPINE CT: No acute or traumatic finding. Chronic degenerative changes as outlined above. Electronically Signed   By: Paulina Fusi M.D.   On: 10/22/2022 11:17   CT Maxillofacial Wo Contrast  Result Date: 10/22/2022 CLINICAL DATA:  Larey Seat in the bathroom with trauma to the head, face and neck. EXAM: CT HEAD WITHOUT CONTRAST CT MAXILLOFACIAL WITHOUT CONTRAST CT CERVICAL SPINE WITHOUT CONTRAST TECHNIQUE: Multidetector CT imaging of the head, cervical spine, and maxillofacial structures were performed using the standard protocol without intravenous contrast. Multiplanar CT image reconstructions of the cervical spine and maxillofacial structures were also generated. RADIATION DOSE REDUCTION: This exam  was performed according to the departmental dose-optimization program which includes automated exposure control, adjustment of the mA and/or kV according to patient size and/or use of iterative reconstruction technique. COMPARISON:  None Available. FINDINGS: CT HEAD FINDINGS Brain: Age related volume loss. 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 No sign of acute infarction, mass lesion, hemorrhage, hydrocephalus or extra-axial collection. Vascular: There is atherosclerotic calcification of the major vessels at the base of the brain. Skull: Negative Other: None CT MAXILLOFACIAL FINDINGS Osseous: Comminuted nasal fractures with slight displacement towards the left. Fracture of the nasal septum.  Fracture of the nasal spine of the maxilla. No other facial fracture. Orbits: No intraorbital injury. Sinuses: Clear except for some layering fluid in the right maxillary sinus, likely related to the nasal fractures. Insignificant retention cyst at the right maxillary sinus floor. Soft tissues: Soft tissue swelling in the region of the nose. CT CERVICAL SPINE FINDINGS Alignment: Straightening of the normal cervical lordosis. Mild curvature convex to the left. Skull base and vertebrae: No regional fracture. Soft tissues and spinal canal: No soft tissue neck injury seen. Extensive calcification of the left proximal vertebral artery incidentally noted. Disc levels: The foramen magnum is widely patent. There is ordinary mild osteoarthritis of the C1-2 articulation but no encroachment upon the neural structures. C2-3: Normal C3-4: Bilateral facet osteoarthritis.  No stenosis. C4-5: Facet osteoarthritis on the right. Mild spondylosis. No compressive stenosis. C5-6: Chronic disc degeneration with loss of disc height and endplate osteophytes. Mild bony foraminal narrowing. C6-7: Chronic disc degeneration with loss of disc height and endplate osteophytes. Mild bony foraminal narrowing. C7-T1: Normal Upper chest: Negative Other: None IMPRESSION: HEAD CT: No acute or traumatic finding. Age related volume loss. 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 MAXILLOFACIAL CT: Comminuted nasal fractures with slight displacement towards the left. Fracture of the nasal septum. Fracture of the nasal spine of the maxilla. No other facial fracture. CERVICAL SPINE CT: No acute or traumatic finding. Chronic degenerative changes as outlined above. Electronically Signed   By: Paulina Fusi M.D.   On: 10/22/2022 11:17   DG Knee Complete 4 Views Right  Result Date: 10/22/2022 CLINICAL DATA:  Right knee pain after fall today. EXAM: RIGHT KNEE - COMPLETE 4+ VIEW COMPARISON:  July 21, 2022. FINDINGS: Status post right total knee  arthroplasty. No fracture or dislocation is noted. Vascular calcifications are noted. IMPRESSION: No acute abnormality seen. Electronically Signed   By: Lupita Raider M.D.   On: 10/22/2022 10:40   DG Hand Complete Left  Result Date: 10/22/2022 CLINICAL DATA:  Left hand pain after fall today. EXAM: LEFT HAND - COMPLETE 3+ VIEW COMPARISON:  None Available. FINDINGS: There is no evidence of fracture or dislocation. Severe degenerative changes seen involving the first carpometacarpal joint as well as the fourth and fifth distal interphalangeal joints. Soft tissues are unremarkable. IMPRESSION: Severe osteoarthritis of multiple joints as described above. No acute abnormality seen. Electronically Signed   By: Lupita Raider M.D.   On: 10/22/2022 10:39    EKG: Independently reviewed. NSR with T wave inv in lead III  Assessment/Plan Principal Problem:   Syncope Active Problems:   History of pulmonary embolus (PE)   Prothrombin gene mutation (HCC)   Coronary artery disease   Essential hypertension   HLD (hyperlipidemia)   Syncope -Telemetry -Echo pending -D-dimer pending (initially I thought IVC filter was removed in May, but it was actually unsuccessful removal, and she still has filter in place)  -Consult Cardiology  Hx PE with prothrombin gene mutation -Provoked PE diagnosed in 03/2015 --> off Eliquis  -S/p IVC filter 07/08/22 --> attempted removal 09/23/22 unsuccessful   HLD -Lipitor    CAD -Aspirin  Nasal fracture -Discussed with Dr. Willeen Cass (ENT). No acute needs, follow up in office in 1 week. Info placed in AVS. Can ice prn for swelling   Right hand pain -Xray ordered     DVT prophylaxis: Lovenox Code Status: Full, confirmed with patient at time of admission  Family Communication: Partner as well as niece at bedside  Disposition Plan: Home Consults called: ENT and cardiology consult placed on epic    Severity of Illness: The appropriate patient status for this  patient is INPATIENT. Inpatient status is judged to be reasonable and necessary in order to provide the required intensity of service to ensure the patient's safety. The patient's presenting symptoms, physical exam findings, and initial radiographic and laboratory data in the context of their chronic comorbidities is felt to place them at high risk for further clinical deterioration. Furthermore, it is not anticipated that the patient will be medically stable for discharge from the hospital within 2 midnights of admission.   * I certify that at the point of admission it is my clinical judgment that the patient will require inpatient hospital care spanning beyond 2 midnights from the point of admission due to high intensity of service, high risk for further deterioration and high frequency of surveillance required.Noralee Stain, DO [facility] 10/22/2022, 4:03 PM   Available via Epic secure chat 7am-7pm After these hours, please refer to coverage provider listed on [email or web address]

## 2022-10-22 NOTE — ED Provider Notes (Signed)
[facility] Provider Note    Event Date/Time   First MD Initiated Contact with Patient 10/22/22 1109     (approximate)   History   Chief Complaint: Loss of Consciousness, Facial Injury, Fall, Knee Injury, Hand Injury, and Neck Injury   HPI  April [name] is a 77 y.o. female with a history of CAD, pulmonary embolism who comes ED due to syncope.  She was in her usual state of health, brushing her teeth in her bathroom when she suddenly felt nauseated and passed out.  She fell forward hitting her face on the floor.  Denies chest pain or shortness of breath.  She was brought to the ED where shortly after arrival in the waiting room, she had another episode of syncope in a chair.  Denies any preceding symptoms.  Currently complains of pain in her head and mouth from trauma.  Unsure last tetanus shot     Physical Exam   Triage Vital Signs: ED Triage Vitals  Enc Vitals Group     BP 10/22/22 0940 (!) 167/82     Pulse Rate 10/22/22 0940 64     Resp 10/22/22 0940 16     Temp 10/22/22 0940 98 F (36.7 C)     Temp Source 10/22/22 0940 Oral     SpO2 10/22/22 0940 100 %     Weight 10/22/22 0941 188 lb (85.3 kg)     Height 10/22/22 0941 5\' 7"  (1.702 m)     Head Circumference --      Peak Flow --      Pain Score 10/22/22 0941 8     Pain Loc --      Pain Edu? --      Excl. in GC? --     Most recent vital signs: Vitals:   10/22/22 1300 10/22/22 1330  BP: (!) 179/63 (!) 182/65  Pulse: 71 68  Resp: 12 20  Temp:    SpO2: 100% 100%    General: Awake, no distress.  CV:  Good peripheral perfusion.  Regular rate and rhythm Resp:  Normal effort.  Clear to auscultation bilaterally Abd:  No distention.  Soft nontender Other:  There is a 3 cm laceration on the right lip extending through the vermilion border.  There is superficial tissue damage along the mucosal surface of the inner upper lip.  There is a laceration over the left hand at the snuffbox.  There  is tenderness swelling and ecchymosis in this area as well.   ED Results / Procedures / Treatments   Labs (all labs ordered are listed, but only abnormal results are displayed) Labs Reviewed  BASIC METABOLIC PANEL - Abnormal; Notable for the following components:      Result Value   CO2 18 (*)    Glucose, Bld 132 (*)    All other components within normal limits  CBC  URINALYSIS, ROUTINE W REFLEX MICROSCOPIC  MAGNESIUM  TSH  D-DIMER, QUANTITATIVE  TROPONIN I (HIGH SENSITIVITY)  TROPONIN I (HIGH SENSITIVITY)     EKG   RADIOLOGY CT head interpreted by me, negative for acute hemorrhage.  Radiology report reviewed  CT maxillofacial reveals comminuted nasal bone fractures. CT cervical spine unremarkable  X-ray left hand and right knee negative for fracture   PROCEDURES:  .Marland KitchenLaceration Repair  Date/Time: 10/22/2022 3:41 PM  Performed by: Sharman Cheek, MD Authorized by: Sharman Cheek, MD   Consent:    Consent obtained:  Verbal   Consent given by:  Patient   Risks discussed:  Infection, poor wound healing, poor cosmetic result and pain Universal protocol:    Patient identity confirmed:  Verbally with patient Anesthesia:    Anesthesia method:  Local infiltration   Local anesthetic:  Lidocaine 1% w/o epi Laceration details:    Location:  Lip   Lip location:  Upper exterior lip   Length (cm):  3 Pre-procedure details:    Preparation:  Patient was prepped and draped in usual sterile fashion and imaging obtained to evaluate for foreign bodies Exploration:    Imaging outcome: foreign body not noted     Wound exploration: entire depth of wound visualized     Wound extent: fascia not violated, no foreign body, no signs of injury, no underlying fracture and no vascular damage     Contaminated: no   Treatment:    Area cleansed with:  Povidone-iodine   Amount of cleaning:  Extensive   Irrigation solution:  Sterile saline   Debridement:  None Skin repair:     Repair method:  Sutures   Suture size:  4-0   Suture material:  Fast-absorbing gut   Suture technique:  Simple interrupted   Number of sutures:  2 Approximation:    Approximation:  Close   Vermilion border well-aligned: yes   Repair type:    Repair type:  Intermediate Post-procedure details:    Dressing:  Open (no dressing)   Procedure completion:  Tolerated well, no immediate complications Comments:        Marland KitchenMarland KitchenLaceration Repair  Date/Time: 10/22/2022 3:42 PM  Performed by: Sharman Cheek, MD Authorized by: Sharman Cheek, MD   Consent:    Consent obtained:  Verbal   Consent given by:  Patient   Risks discussed:  Infection, pain, poor wound healing and poor cosmetic result   Alternatives discussed:  No treatment Universal protocol:    Patient identity confirmed:  Verbally with patient Anesthesia:    Anesthesia method:  Local infiltration   Local anesthetic:  Lidocaine 1% w/o epi Laceration details:    Location:  Hand   Hand location:  L wrist   Length (cm):  2 Pre-procedure details:    Preparation:  Patient was prepped and draped in usual sterile fashion and imaging obtained to evaluate for foreign bodies Exploration:    Limited defect created (wound extended): no     Imaging obtained: x-ray     Imaging outcome: foreign body not noted     Wound exploration: wound explored through full range of motion and entire depth of wound visualized     Wound extent: fascia not violated, no foreign body, no signs of injury, no nerve damage, no tendon damage, no underlying fracture and no vascular damage     Contaminated: no   Treatment:    Area cleansed with:  Povidone-iodine   Amount of cleaning:  Extensive   Irrigation solution:  Sterile saline   Visualized foreign bodies/material removed: no     Undermining:  Minimal   Layers/structures repaired:  Deep subcutaneous Deep subcutaneous:    Suture size:  4-0   Suture material:  Monocryl   Suture technique:  Simple  interrupted   Number of sutures:  1 Skin repair:    Repair method:  Sutures   Suture size:  4-0   Suture material:  Plain gut   Suture technique:  Running   Number of sutures:  6 Approximation:    Approximation:  Close Repair type:    Repair type:  Complex Post-procedure  details:    Dressing:  Sterile dressing   Procedure completion:  Tolerated well, no immediate complications    MEDICATIONS ORDERED IN ED: Medications  Tdap (BOOSTRIX) injection 0.5 mL (0.5 mLs Intramuscular Given 10/22/22 1144)  lidocaine (PF) (XYLOCAINE) 1 % injection 10 mL (10 mLs Intradermal Given by Other 10/22/22 1155)  morphine (PF) 4 MG/ML injection 4 mg (4 mg Intravenous Given 10/22/22 1514)  sodium chloride 0.9 % bolus 1,000 mL (1,000 mLs Intravenous New Bag/Given 10/22/22 1513)     IMPRESSION / MDM / ASSESSMENT AND PLAN / ED COURSE  I reviewed the triage vital signs and the nursing notes.  DDx: Intracranial hemorrhage, skull fracture, C-spine fracture, facial fracture, left hand or wrist fracture, anemia, electrolyte abnormality, non-STEMI, paroxysmal arrhythmia  Patient's presentation is most consistent with acute presentation with potential threat to life or bodily function.  Patient presents after syncope at home resulting in head and facial trauma.  Had recurrent syncope here in the ED.  She is hypertensive on arrival but otherwise vitals are normal, no specific acute cardiopulmonary or neurologic symptoms apart from sequelae of trauma.  Wounds were cleaned and repaired, tetanus updated.  Initial labs unremarkable.  Has nasal bone fractures but otherwise trauma workup is negative.  Case discussed with hospitalist for further evaluation of syncope.       FINAL CLINICAL IMPRESSION(S) / ED DIAGNOSES   Final diagnoses:  Syncope and collapse  Closed fracture of nasal bone, initial encounter  Tenderness of anatomical snuffbox     Rx / DC Orders   ED Discharge Orders     None         Note:  This document was prepared using Dragon voice recognition software and may include unintentional dictation errors.   Sharman Cheek, MD 10/22/22 [phone]

## 2022-10-23 ENCOUNTER — Inpatient Hospital Stay: Payer: HMO

## 2022-10-23 ENCOUNTER — Other Ambulatory Visit: Payer: HMO

## 2022-10-23 ENCOUNTER — Ambulatory Visit: Payer: HMO

## 2022-10-23 ENCOUNTER — Encounter: Payer: Self-pay | Admitting: Internal Medicine

## 2022-10-23 DIAGNOSIS — R569 Unspecified convulsions: Secondary | ICD-10-CM

## 2022-10-23 DIAGNOSIS — R55 Syncope and collapse: Secondary | ICD-10-CM | POA: Diagnosis not present

## 2022-10-23 LAB — CBC
HCT: 32.8 % — ABNORMAL LOW (ref 36.0–46.0)
Hemoglobin: 10.3 g/dL — ABNORMAL LOW (ref 12.0–15.0)
MCH: 29.9 pg (ref 26.0–34.0)
MCHC: 31.4 g/dL (ref 30.0–36.0)
MCV: 95.3 fL (ref 80.0–100.0)
Platelets: 222 10*3/uL (ref 150–400)
RBC: 3.44 MIL/uL — ABNORMAL LOW (ref 3.87–5.11)
RDW: 12.8 % (ref 11.5–15.5)
WBC: 5.8 10*3/uL (ref 4.0–10.5)
nRBC: 0 % (ref 0.0–0.2)

## 2022-10-23 LAB — COMPREHENSIVE METABOLIC PANEL
ALT: 15 U/L (ref 0–44)
AST: 18 U/L (ref 15–41)
Albumin: 3.5 g/dL (ref 3.5–5.0)
Alkaline Phosphatase: 71 U/L (ref 38–126)
Anion gap: 7 (ref 5–15)
BUN: 18 mg/dL (ref 8–23)
CO2: 24 mmol/L (ref 22–32)
Calcium: 8.2 mg/dL — ABNORMAL LOW (ref 8.9–10.3)
Chloride: 108 mmol/L (ref 98–111)
Creatinine, Ser: 0.9 mg/dL (ref 0.44–1.00)
GFR, Estimated: >60 mL/min (ref >60)
Glucose, Bld: 128 mg/dL — ABNORMAL HIGH (ref 70–99)
Potassium: 3.7 mmol/L (ref 3.5–5.1)
Sodium: 139 mmol/L (ref 135–145)
Total Bilirubin: 1.2 mg/dL (ref 0.3–1.2)
Total Protein: 6.3 g/dL — ABNORMAL LOW (ref 6.5–8.1)

## 2022-10-23 MED ORDER — ATENOLOL 25 MG PO TABS
25.0000 mg | ORAL_TABLET | Freq: Every day | ORAL | Status: DC
  Administered 2022-10-23 – 2022-10-24 (×2): 25 mg via ORAL
  Filled 2022-10-23 (×2): qty 1

## 2022-10-23 MED ORDER — ENOXAPARIN SODIUM 60 MG/0.6ML IJ SOSY
0.5000 mg/kg | PREFILLED_SYRINGE | INTRAMUSCULAR | Status: DC
  Administered 2022-10-23: 45 mg via SUBCUTANEOUS
  Filled 2022-10-23: qty 0.6

## 2022-10-23 MED ORDER — IOHEXOL 350 MG/ML SOLN
75.0000 mL | Freq: Once | INTRAVENOUS | Status: AC | PRN
  Administered 2022-10-23: 75 mL via INTRAVENOUS

## 2022-10-23 NOTE — Procedures (Signed)
Patient Name: April [name]  MRN: [id number]  Epilepsy Attending: Charlsie Quest  Referring Physician/Provider: Noralee Stain, DO  Date: 10/23/2022 Duration: 32.44 mins  Patient history: 76yo f with syncope. EEG to evaluate for seizure  Level of alertness: Awake, asleep  AEDs during EEG study: None  Technical aspects: This EEG study was done with scalp electrodes positioned according to the 10-20 International system of electrode placement. Electrical activity was reviewed with band pass filter of 1-70Hz , sensitivity of 7 uV/mm, display speed of 67mm/sec with a 60Hz  notched filter applied as appropriate. EEG data were recorded continuously and digitally stored.  Video monitoring was available and reviewed as appropriate.  Description: The posterior dominant rhythm consists of 8 Hz activity of moderate voltage (25-35 uV) seen predominantly in posterior head regions, symmetric and reactive to eye opening and eye closing. Sleep was characterized by vertex waves, sleep spindles (12 to 14 Hz), maximal frontocentral region. Hyperventilation and photic stimulation were not performed.     IMPRESSION: This study is within normal limits. No seizures or epileptiform discharges were seen throughout the recording.  A normal interictal EEG does not exclude the diagnosis of epilepsy.  Domenique Southers Annabelle Harman

## 2022-10-23 NOTE — Consult Note (Signed)
Cardiology Consultation   Patient ID: April [name] MRN: [id number]; DOB: [birth year]  Admit date: 10/22/2022 Date of Consult: 10/23/2022  PCP:  Marina Goodell, MD   [facility] Providers Cardiologist: [facility] cardiology  Patient Profile:   April [name] is a 77 y.o. female with a hx of CAD status post DES to left circumflex in 2006 for non-STEMI, hypertension, hyperlipidemia, prior provoked PE, status post IVC filter with unsuccessful removal, prothrombin gene mutation who is being seen 10/23/2022 for the evaluation of syncope at the request of Dr. Lyn Hollingshead.  History of Present Illness:   April [name] patient follows with [facility] cardiology.  She has a history of PCI to left circumflex in 2006 for non-STEMI.  Symptoms at that time, mild arm pain associated with stress (husband was having an MI himself at that time).  Left heart cath also showed 20% P RCA, 40% ostial D1 disease.  In 2012 she had recurrent symptoms and underwent exercise SPECT MPI 09/2010 that was normal..  Patient has a history of provoked PE related to severe illness and dehydration in 2017.  She was previously on Eliquis.  Echo February 2024 for shortness of breath showed normal LVEF 60 to 65%, mild AI.  Patient saw vascular surgery in March 2024 for preoperative evaluation for orthopedic surgeon.  Was felt IVC filter was strongly indicated prior to high risk orthopedic surgery, especially given history of PE/DVT.  Patient underwent total right knee arthroplasty on July 21, 2022.  Attempts at removal of IVC on May 28 were not successful.  She presented to [facility] ED on 10/22/2022 for syncope. The patient was brushing her teeth when she turned around and fell. She said she felt sick to her stomach before falling. No dizziness or lightheadedness. NO chest pain, SOB, palpitations. She called to her partner. The patient fell on her face, sustaining injuries. She was out for about 30 seconds. The patient was  brought to the ER for further work-up. She reports she had a similar fall a couple weeks ago. She does not use a cane or walker at home. She can perform all ADLs.   In the ER, blood pressure was 167/82, pulse rate 64, respiratory rate 16, afebrile, 100% O2.  Initial Labs unremarkable. Ddimer 2.35. Chest CTA showed no PE, coronary artery and aortic calcification. CT of the head was negative. EKG shod NSR tWI III. CT of the spine and face were negative.  X-ray of the hand was negative.In the ER she had recurrent syncope, normal vitals and telemetry. She was given IVF and admitted for further work-up.  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  Gerarda Fraction, MD   Pulmonary embolism (HCC) 2016   Pulmonary embolism (HCC) 2016   Vertigo 1990   "inner ear"    Past Surgical History:  Procedure Laterality Date   ABDOMINAL HYSTERECTOMY     APPENDECTOMY     BREAST BIOPSY Right 04/30/2020   11:00 5cmfn Qmarker-benign, 11:00 8 cmfn twisted X marker-benign   CARDIAC CATHETERIZATION  2005  CATARACT EXTRACTION W/PHACO Left 02/08/2019   Procedure: CATARACT EXTRACTION PHACO AND INTRAOCULAR LENS PLACEMENT (IOC) LEFT 00:40.0  20.6%  8.26;  Surgeon: Galen Manila, MD;  Location: [facility];  Service: Ophthalmology;  Laterality: Left;   CATARACT EXTRACTION W/PHACO Right 04/26/2019   Procedure: CATARACT EXTRACTION PHACO AND INTRAOCULAR LENS PLACEMENT (IOC) RIGHT 6.83 00:46.0;  Surgeon: Galen Manila, MD;  Location: [facility];  Service: Ophthalmology;  Laterality: Right;   CHOLECYSTECTOMY     COLONOSCOPY  2017   FOOT SURGERY Right    IVC FILTER  INSERTION N/A 07/08/2022   Procedure: IVC FILTER INSERTION;  Surgeon: Renford Dills, MD;  Location: [facility] INVASIVE CV LAB;  Service: Cardiovascular;  Laterality: N/A;   IVC FILTER REMOVAL N/A 09/23/2022   Procedure: IVC FILTER REMOVAL;  Surgeon: Renford Dills, MD;  Location: [facility] INVASIVE CV LAB;  Service: Cardiovascular;  Laterality: N/A;   TOTAL KNEE ARTHROPLASTY Right 07/21/2022   Procedure: TOTAL KNEE ARTHROPLASTY;  Surgeon: Reinaldo Berber, MD;  Location: [facility] ORS;  Service: Orthopedics;  Laterality: Right;     Home Medications:  Prior to Admission medications   Medication Sig Start Date End Date Taking? Authorizing Provider  acetaminophen (TYLENOL) 500 MG tablet Take 2 tablets (1,000 mg total) by mouth every 8 (eight) hours. 07/22/22  Yes Evon Slack, PA-C  aspirin EC 81 MG tablet Take 81 mg by mouth. 09/24/05  Yes [provider]  atenolol (TENORMIN) 25 MG tablet Take 1 tablet by mouth daily. 09/22/14  Yes [provider]  atorvastatin (LIPITOR) 40 MG tablet Take 40 mg by mouth daily.   Yes [provider]  calcium citrate-vitamin D (CITRACAL+D) 315-200 MG-UNIT tablet Take 1 tablet by mouth 2 (two) times daily.   Yes [provider]  Cholecalciferol (VITAMIN D3) 2000 UNITS capsule Take 1 capsule by mouth daily.   Yes [provider]  EPINEPHrine (EPIPEN 2-PAK) 0.3 mg/0.3 mL IJ SOAJ injection Inject 0.3 mLs (0.3 mg total) into the muscle as needed for anaphylaxis. 10/31/19  Yes Minna Antis, MD  Turmeric (QC TUMERIC COMPLEX PO) Take 1,000 mg by mouth daily at 2 PM.   Yes [provider]  Homeopathic Products (FRANKINCENSE UPLIFTING) OIL Inhale into the lungs.    [provider]    Inpatient Medications: Scheduled Meds:  aspirin EC  81 mg Oral Daily   atorvastatin  40 mg Oral Daily   enoxaparin (LOVENOX) injection  40 mg Subcutaneous Q24H   sodium chloride flush  3 mL Intravenous Q12H   Continuous  Infusions:  sodium chloride 100 mL/hr at 10/22/22 1745   PRN Meds: acetaminophen **OR** acetaminophen, fentaNYL (SUBLIMAZE) injection, HYDROcodone-acetaminophen, ondansetron **OR** ondansetron (ZOFRAN) IV  Allergies:    Allergies  Allergen Reactions   Antihistamines, Chlorpheniramine-Type Other (See Comments)    "think crazy thoughts"   Ciprofloxacin Other (See Comments)    Patient cannot remember reaction   Dristan Cold [Chlorphen-Pe-Acetaminophen] Other (See Comments)    "think crazy thoughts"   Ginger Diarrhea   Nitrofurantoin Nausea Only   Celecoxib Rash    Social History:   Social History   Socioeconomic History   Marital status: Widowed    Spouse name: Not on file   Number of children: Not on file   Years of education: Not on file   Highest education level: Not on file  Occupational History   Not on file  Tobacco Use   Smoking status: Never   Smokeless tobacco: Never  Vaping Use  Vaping Use: Never used  Substance and Sexual Activity   Alcohol use: No   Drug use: No   Sexual activity: Yes    Birth control/protection: None  Other Topics Concern   Not on file  Social History Narrative   Not on file   Social Determinants of Health   Financial Resource Strain: Not on file  Food Insecurity: No Food Insecurity (10/23/2022)   Hunger Vital Sign    Worried About Running Out of Food in the Last Year: Never true    Ran Out of Food in the Last Year: Never true  Transportation Needs: No Transportation Needs (10/23/2022)   PRAPARE - Administrator, Civil Service (Medical): No    Lack of Transportation (Non-Medical): No  Physical Activity: Not on file  Stress: Not on file  Social Connections: Not on file  Intimate Partner Violence: Not At Risk (10/23/2022)   Humiliation, Afraid, Rape, and Kick questionnaire    Fear of Current or Ex-Partner: No    Emotionally Abused: No    Physically Abused: No    Sexually Abused: No    Family History:    Family  History  Problem Relation Age of Onset   Stroke Mother    CAD Father    Colon cancer Other        paternal great grandfather   Breast cancer Neg Hx      ROS:  Please see the history of present illness.   All other ROS reviewed and negative.     Physical Exam/Data:   Vitals:   10/22/22 1937 10/22/22 2230 10/22/22 2323 10/23/22 0315  BP:  107/63  (!) 156/60  Pulse:  66  69  Resp:  13  18  Temp: 98.1 F (36.7 C)  98 F (36.7 C) 98 F (36.7 C)  TempSrc: Oral  Oral Oral  SpO2:  99%  94%  Weight:    89.1 kg  Height:    5\' 7"  (1.702 m)   No intake or output data in the 24 hours ending 10/23/22 0758    10/23/2022    3:15 AM 10/22/2022    9:41 AM 10/16/2022    1:41 PM  Last 3 Weights  Weight (lbs) 196 lb 6.9 oz 188 lb 191 lb 3.2 oz  Weight (kg) 89.1 kg 85.276 kg 86.728 kg     Body mass index is 30.77 kg/m.  General:  Well nourished, well developed, in no acute distress HEENT: normal Neck: no JVD Vascular: No carotid bruits; Distal pulses 2+ bilaterally Cardiac:  normal S1, S2; RRR; +murmur  Lungs:  clear to auscultation bilaterally, no wheezing, rhonchi or rales  Abd: soft, nontender, no hepatomegaly  Ext: no edema Musculoskeletal:  No deformities, BUE and BLE strength normal and equal Skin: warm and dry  Neuro:  CNs 2-12 intact, no focal abnormalities noted Psych:  Normal affect   EKG:  The EKG was personally reviewed and demonstrates:  NSR 63bpm, TWI III Telemetry:  Telemetry was personally reviewed and demonstrates:  \NSR 60  Relevant CV Studies:  Echo 05/2022  Summary   1. The left ventricle is normal in size with mildly increased wall  thickness.   2. The left ventricular systolic function is normal, LVEF is visually  estimated at 60-65%.    3. The aortic valve is trileaflet with mildly thickened leaflets with normal  excursion.   4. There is mild aortic regurgitation.    5. The right ventricle is normal in size, with  normal systolic function.    Interventions / Surgery: LHC 10/06 - 90% dLCx, 20% pRCA, 40% ostial D1, anterolateral and mid inferior hypokinesis, EF 45-50%. Cypher DES 2.5 by 13 mm to LCx.  Imaging: EKG today - Sinus bradycardia, poor R wave progression Exercise SPECT 6/12: Exercised 6:19, 7.4 METS, Peak HR 146. Stopped for dyspnea, no ischemic EKG changes. Normal myocardial perfusion study. EF 60% post stress.   Laboratory Data:  High Sensitivity Troponin:   Recent Labs  Lab 10/22/22 0945 10/22/22 1143  TROPONINIHS 5 6     Chemistry Recent Labs  Lab 10/22/22 0945 10/22/22 1513 10/23/22 0405  NA 136  --  139  K 4.2  --  3.7  CL 106  --  108  CO2 18*  --  24  GLUCOSE 132*  --  128*  BUN 21  --  18  CREATININE 0.89  --  0.90  CALCIUM 9.4  --  8.2*  MG  --  2.0  --   GFRNONAA >60  --  >60  ANIONGAP 12  --  7    Recent Labs  Lab 10/23/22 0405  PROT 6.3*  ALBUMIN 3.5  AST 18  ALT 15  ALKPHOS 71  BILITOT 1.2   Lipids No results for input(s): "CHOL", "TRIG", "HDL", "LABVLDL", "LDLCALC", "CHOLHDL" in the last 168 hours.  Hematology Recent Labs  Lab 10/22/22 0945 10/23/22 0405  WBC 7.4 5.8  RBC 4.51 3.44*  HGB 13.4 10.3*  HCT 42.9 32.8*  MCV 95.1 95.3  MCH 29.7 29.9  MCHC 31.2 31.4  RDW 12.5 12.8  PLT 272 222   Thyroid  Recent Labs  Lab 10/22/22 1513  TSH 1.624    BNPNo results for input(s): "BNP", "PROBNP" in the last 168 hours.  DDimer  Recent Labs  Lab 10/22/22 1513  DDIMER 2.35*     Radiology/Studies:  CT Angio Chest Pulmonary Embolism (PE) W or WO Contrast  Result Date: 10/23/2022 CLINICAL DATA:  77 year old female status post recent lower extremity surgery. Syncope, fall, abnormal D-dimer. History of pulmonary emboli in 2016. EXAM: CT ANGIOGRAPHY CHEST WITH CONTRAST TECHNIQUE: Multidetector CT imaging of the chest was performed using the standard protocol during bolus administration of intravenous contrast. Multiplanar CT image reconstructions and MIPs were obtained  to evaluate the vascular anatomy. RADIATION DOSE REDUCTION: This exam was performed according to the departmental dose-optimization program which includes automated exposure control, adjustment of the mA and/or kV according to patient size and/or use of iterative reconstruction technique. CONTRAST:  75mL OMNIPAQUE IOHEXOL 350 MG/ML SOLN COMPARISON:  Cervical spine CT yesterday. Chest CTA 04/14/2015. CT Abdomen and Pelvis 12/08/2020. FINDINGS: Cardiovascular: Excellent contrast bolus timing in the pulmonary arterial tree. Mild respiratory motion at the lung bases. The 2016 pulmonary artery thrombus appears resolved and no pulmonary artery filling defect is identified. Calcified aortic atherosclerosis. Mild pectus excavatum. Heart size is stable since 2016. No pericardial effusion. Calcified coronary artery atherosclerosis. Mediastinum/Nodes: Negative. No mediastinal mass or lymphadenopathy. Lungs/Pleura: Mildly lower lung volumes compared to 2016. Major airways remain patent. Symmetric dependent mostly sub solid lung opacity compatible with atelectasis. Mild pulmonary mosaic attenuation otherwise, probably gas trapping. No pleural effusion or consolidation. Upper Abdomen: Chronic cholecystectomy. Visible early postcontrast appearance of the liver, spleen, pancreas, adrenal glands, and kidneys within normal limits. Nondilated bowel in the upper abdomen with no free air or free fluid identified. Musculoskeletal: No acute osseous abnormality identified. Review of the MIP images confirms the above findings. IMPRESSION: 1. Negative for acute  pulmonary embolus. 2. Pulmonary atelectasis and gas trapping. 3. Calcified coronary artery and Aortic Atherosclerosis (ICD10-I70.0). Electronically Signed   By: Odessa Fleming M.D.   On: 10/23/2022 07:01   DG Hand Complete Right  Result Date: 10/22/2022 CLINICAL DATA:  Larey Seat this morning, right hand pain EXAM: RIGHT HAND - COMPLETE 3+ VIEW COMPARISON:  None Available. FINDINGS: Frontal,  oblique, and lateral views of the right hand are obtained. No acute fracture, subluxation, or dislocation. There is diffuse osteoarthritis, most pronounced at the first carpometacarpal joint. Soft tissues are unremarkable. IMPRESSION: 1. No acute fracture. 2. Diffuse osteoarthritis greatest at the first carpometacarpal joint. Electronically Signed   By: Sharlet Salina M.D.   On: 10/22/2022 16:09   CT HEAD WO CONTRAST ( )  Result Date: 10/22/2022 CLINICAL DATA:  Larey Seat in the bathroom with trauma to the head, face and neck. EXAM: CT HEAD WITHOUT CONTRAST CT MAXILLOFACIAL WITHOUT CONTRAST CT CERVICAL SPINE WITHOUT CONTRAST TECHNIQUE: Multidetector CT imaging of the head, cervical spine, and maxillofacial structures were performed using the standard protocol without intravenous contrast. Multiplanar CT image reconstructions of the cervical spine and maxillofacial structures were also generated. RADIATION DOSE REDUCTION: This exam was performed according to the departmental dose-optimization program which includes automated exposure control, adjustment of the mA and/or kV according to patient size and/or use of iterative reconstruction technique. COMPARISON:  None Available. FINDINGS: CT HEAD FINDINGS Brain: Age related volume loss. 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 No sign of acute infarction, mass lesion, hemorrhage, hydrocephalus or extra-axial collection. Vascular: There is atherosclerotic calcification of the major vessels at the base of the brain. Skull: Negative Other: None CT MAXILLOFACIAL FINDINGS Osseous: Comminuted nasal fractures with slight displacement towards the left. Fracture of the nasal septum. Fracture of the nasal spine of the maxilla. No other facial fracture. Orbits: No intraorbital injury. Sinuses: Clear except for some layering fluid in the right maxillary sinus, likely related to the nasal fractures. Insignificant retention cyst at the right maxillary sinus floor.  Soft tissues: Soft tissue swelling in the region of the nose. CT CERVICAL SPINE FINDINGS Alignment: Straightening of the normal cervical lordosis. Mild curvature convex to the left. Skull base and vertebrae: No regional fracture. Soft tissues and spinal canal: No soft tissue neck injury seen. Extensive calcification of the left proximal vertebral artery incidentally noted. Disc levels: The foramen magnum is widely patent. There is ordinary mild osteoarthritis of the C1-2 articulation but no encroachment upon the neural structures. C2-3: Normal C3-4: Bilateral facet osteoarthritis.  No stenosis. C4-5: Facet osteoarthritis on the right. Mild spondylosis. No compressive stenosis. C5-6: Chronic disc degeneration with loss of disc height and endplate osteophytes. Mild bony foraminal narrowing. C6-7: Chronic disc degeneration with loss of disc height and endplate osteophytes. Mild bony foraminal narrowing. C7-T1: Normal Upper chest: Negative Other: None IMPRESSION: HEAD CT: No acute or traumatic finding. Age related volume loss. 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 MAXILLOFACIAL CT: Comminuted nasal fractures with slight displacement towards the left. Fracture of the nasal septum. Fracture of the nasal spine of the maxilla. No other facial fracture. CERVICAL SPINE CT: No acute or traumatic finding. Chronic degenerative changes as outlined above. Electronically Signed   By: Paulina Fusi M.D.   On: 10/22/2022 11:17   CT Cervical Spine Wo Contrast  Result Date: 10/22/2022 CLINICAL DATA:  Larey Seat in the bathroom with trauma to the head, face and neck. EXAM: CT HEAD WITHOUT CONTRAST CT MAXILLOFACIAL WITHOUT CONTRAST CT CERVICAL SPINE WITHOUT CONTRAST TECHNIQUE: Multidetector  CT imaging of the head, cervical spine, and maxillofacial structures were performed using the standard protocol without intravenous contrast. Multiplanar CT image reconstructions of the cervical spine and maxillofacial structures were  also generated. RADIATION DOSE REDUCTION: This exam was performed according to the departmental dose-optimization program which includes automated exposure control, adjustment of the mA and/or kV according to patient size and/or use of iterative reconstruction technique. COMPARISON:  None Available. FINDINGS: CT HEAD FINDINGS Brain: Age related volume loss. 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 No sign of acute infarction, mass lesion, hemorrhage, hydrocephalus or extra-axial collection. Vascular: There is atherosclerotic calcification of the major vessels at the base of the brain. Skull: Negative Other: None CT MAXILLOFACIAL FINDINGS Osseous: Comminuted nasal fractures with slight displacement towards the left. Fracture of the nasal septum. Fracture of the nasal spine of the maxilla. No other facial fracture. Orbits: No intraorbital injury. Sinuses: Clear except for some layering fluid in the right maxillary sinus, likely related to the nasal fractures. Insignificant retention cyst at the right maxillary sinus floor. Soft tissues: Soft tissue swelling in the region of the nose. CT CERVICAL SPINE FINDINGS Alignment: Straightening of the normal cervical lordosis. Mild curvature convex to the left. Skull base and vertebrae: No regional fracture. Soft tissues and spinal canal: No soft tissue neck injury seen. Extensive calcification of the left proximal vertebral artery incidentally noted. Disc levels: The foramen magnum is widely patent. There is ordinary mild osteoarthritis of the C1-2 articulation but no encroachment upon the neural structures. C2-3: Normal C3-4: Bilateral facet osteoarthritis.  No stenosis. C4-5: Facet osteoarthritis on the right. Mild spondylosis. No compressive stenosis. C5-6: Chronic disc degeneration with loss of disc height and endplate osteophytes. Mild bony foraminal narrowing. C6-7: Chronic disc degeneration with loss of disc height and endplate osteophytes. Mild bony  foraminal narrowing. C7-T1: Normal Upper chest: Negative Other: None IMPRESSION: HEAD CT: No acute or traumatic finding. Age related volume loss. 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 MAXILLOFACIAL CT: Comminuted nasal fractures with slight displacement towards the left. Fracture of the nasal septum. Fracture of the nasal spine of the maxilla. No other facial fracture. CERVICAL SPINE CT: No acute or traumatic finding. Chronic degenerative changes as outlined above. Electronically Signed   By: Paulina Fusi M.D.   On: 10/22/2022 11:17   CT Maxillofacial Wo Contrast  Result Date: 10/22/2022 CLINICAL DATA:  Larey Seat in the bathroom with trauma to the head, face and neck. EXAM: CT HEAD WITHOUT CONTRAST CT MAXILLOFACIAL WITHOUT CONTRAST CT CERVICAL SPINE WITHOUT CONTRAST TECHNIQUE: Multidetector CT imaging of the head, cervical spine, and maxillofacial structures were performed using the standard protocol without intravenous contrast. Multiplanar CT image reconstructions of the cervical spine and maxillofacial structures were also generated. RADIATION DOSE REDUCTION: This exam was performed according to the departmental dose-optimization program which includes automated exposure control, adjustment of the mA and/or kV according to patient size and/or use of iterative reconstruction technique. COMPARISON:  None Available. FINDINGS: CT HEAD FINDINGS Brain: Age related volume loss. 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 No sign of acute infarction, mass lesion, hemorrhage, hydrocephalus or extra-axial collection. Vascular: There is atherosclerotic calcification of the major vessels at the base of the brain. Skull: Negative Other: None CT MAXILLOFACIAL FINDINGS Osseous: Comminuted nasal fractures with slight displacement towards the left. Fracture of the nasal septum. Fracture of the nasal spine of the maxilla. No other facial fracture. Orbits: No intraorbital injury. Sinuses: Clear except  for some layering fluid in the right maxillary  sinus, likely related to the nasal fractures. Insignificant retention cyst at the right maxillary sinus floor. Soft tissues: Soft tissue swelling in the region of the nose. CT CERVICAL SPINE FINDINGS Alignment: Straightening of the normal cervical lordosis. Mild curvature convex to the left. Skull base and vertebrae: No regional fracture. Soft tissues and spinal canal: No soft tissue neck injury seen. Extensive calcification of the left proximal vertebral artery incidentally noted. Disc levels: The foramen magnum is widely patent. There is ordinary mild osteoarthritis of the C1-2 articulation but no encroachment upon the neural structures. C2-3: Normal C3-4: Bilateral facet osteoarthritis.  No stenosis. C4-5: Facet osteoarthritis on the right. Mild spondylosis. No compressive stenosis. C5-6: Chronic disc degeneration with loss of disc height and endplate osteophytes. Mild bony foraminal narrowing. C6-7: Chronic disc degeneration with loss of disc height and endplate osteophytes. Mild bony foraminal narrowing. C7-T1: Normal Upper chest: Negative Other: None IMPRESSION: HEAD CT: No acute or traumatic finding. Age related volume loss. 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 MAXILLOFACIAL CT: Comminuted nasal fractures with slight displacement towards the left. Fracture of the nasal septum. Fracture of the nasal spine of the maxilla. No other facial fracture. CERVICAL SPINE CT: No acute or traumatic finding. Chronic degenerative changes as outlined above. Electronically Signed   By: Paulina Fusi M.D.   On: 10/22/2022 11:17   DG Knee Complete 4 Views Right  Result Date: 10/22/2022 CLINICAL DATA:  Right knee pain after fall today. EXAM: RIGHT KNEE - COMPLETE 4+ VIEW COMPARISON:  July 21, 2022. FINDINGS: Status post right total knee arthroplasty. No fracture or dislocation is noted. Vascular calcifications are noted. IMPRESSION: No acute abnormality seen.  Electronically Signed   By: Lupita Raider M.D.   On: 10/22/2022 10:40   DG Hand Complete Left  Result Date: 10/22/2022 CLINICAL DATA:  Left hand pain after fall today. EXAM: LEFT HAND - COMPLETE 3+ VIEW COMPARISON:  None Available. FINDINGS: There is no evidence of fracture or dislocation. Severe degenerative changes seen involving the first carpometacarpal joint as well as the fourth and fifth distal interphalangeal joints. Soft tissues are unremarkable. IMPRESSION: Severe osteoarthritis of multiple joints as described above. No acute abnormality seen. Electronically Signed   By: Lupita Raider M.D.   On: 10/22/2022 10:39     Assessment and Plan:   Syncope - patient with fall from standing with prodrome of nausea. She was out for 30 seconds. She sustained facial and wrist issues - she was hypertensive on arrival - she reports good oral intake - EKG with NSR with TWI III. 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

## 2022-10-23 NOTE — Progress Notes (Signed)
Eeg done 

## 2022-10-23 NOTE — Hospital Course (Addendum)
April [name] is a 77 y.o. female with medical history significant of CAD, hx MI, PE s/p IVC filter, HTN, HLD who presents after syncopal episode at home.  She was standing in the bathroom, just finished brushing her teeth when she felt sudden nausea.  She called out to her partner, but then passed out.  Partner was at patient's side, states that she had loss of consciousness for about 30 seconds.  When she woke up, she had no episodes of confusion or postictal signs.  EMS was called.  Patient states that she has been feeling relatively well.  She recently underwent right knee surgery for osteoarthritis.  In the emergency department, had another episode where she felt nausea, by the time RN returned with a bag, she was unresponsive again.  This episode also lasted about 30 seconds without postictal signs.   06/26: in ED, Labs overall unremarkable, troponin negative. CT head, cervical spine, maxillofacial showed comminuted nasal fracture, without other acute or traumatic finding. Right knee x-ray negative. Left hand x-ray revealed severe osteoarthritis. Admitted to hospitalist service, telemetry, Echo, cardiology consult, plan EEG. CTA chest no PE. ENT consult - follow up in 1 week outpatient.  06/27: EEG no concerns. Per cardiology - Echo and if telemetry does not show evidence of arrhythmia, would arrange 2-week outpatient monitor.  06/28: Echo EF 65-70%, normal LV fxn, normal RV, no significant valvular disease. US carotid ***  Consultants:  Cardiology  ENT  Procedures: none      ASSESSMENT & PLAN:   Principal Problem:   Syncope Active Problems:   History of pulmonary embolus (PE)   Prothrombin gene mutation (HCC)   Coronary artery disease   Essential hypertension   HLD (hyperlipidemia)  Syncope History c/w vasovagal but uncertain cause/trigger  Telemetry, EEG, Echo, Neuroimaging - no concerns Carotid US pending   Hx PE with prothrombin gene mutation -Provoked PE diagnosed  in 03/2015 --> off Eliquis  S/p IVC filter 07/08/22 --> attempted removal 09/23/22 unsuccessful, follow w/ vascular surgery    HLD Lipitor     CAD Aspirin   Nasal fracture per Dr. Willeen Cass (ENT), follow up in office in 1 week. Info placed in AVS. Can ice prn for swelling    Right hand pain Xray no fx  Pain control / ice as needed    DVT prophylaxis: lovenox  Pertinent IV fluids/nutrition: stopped continuous IV fluids   Central lines / invasive devices: none  Code Status: FULL CODE  ACP documentation reviewed: none on file   Current Admission Status: inpatient   TOC needs / Dispo plan: TBD Barriers to discharge / significant pending items: echo

## 2022-10-23 NOTE — Care Management Important Message (Signed)
Important Message  Patient Details  Name: April [name] MRN: [id number] Date of Birth: 06/17/1945   Medicare Important Message Given:  N/A - LOS <3 / Initial given by admissions     Johnell Comings 10/23/2022, 7:42 PM

## 2022-10-24 ENCOUNTER — Telehealth: Payer: Self-pay | Admitting: Medical

## 2022-10-24 ENCOUNTER — Inpatient Hospital Stay (HOSPITAL_COMMUNITY)
Admit: 2022-10-24 | Discharge: 2022-10-24 | Disposition: A | Discharge status: Home / Self Care | Payer: HMO | Attending: Internal Medicine | Admitting: Internal Medicine

## 2022-10-24 ENCOUNTER — Other Ambulatory Visit: Payer: Self-pay

## 2022-10-24 ENCOUNTER — Inpatient Hospital Stay: Payer: HMO | Attending: Medical

## 2022-10-24 DIAGNOSIS — I10 Essential (primary) hypertension: Secondary | ICD-10-CM | POA: Diagnosis not present

## 2022-10-24 DIAGNOSIS — R55 Syncope and collapse: Secondary | ICD-10-CM

## 2022-10-24 LAB — ECHOCARDIOGRAM COMPLETE
AR max vel: 2.6 cm2
AV Area VTI: 2.99 cm2
AV Area mean vel: 2.68 cm2
AV Mean grad: 5 mmHg
AV Peak grad: 9.1 mmHg
Ao pk vel: 1.51 m/s
Area-P 1/2: 3.83 cm2
Height: 67 in
MV VTI: 3.38 cm2
S' Lateral: 2.4 cm
Weight: 3142.88 oz

## 2022-10-24 LAB — CBC
HCT: 31.7 % — ABNORMAL LOW (ref 36.0–46.0)
Hemoglobin: 10.4 g/dL — ABNORMAL LOW (ref 12.0–15.0)
MCH: 30.3 pg (ref 26.0–34.0)
MCHC: 32.8 g/dL (ref 30.0–36.0)
MCV: 92.4 fL (ref 80.0–100.0)
Platelets: 206 10*3/uL (ref 150–400)
RBC: 3.43 MIL/uL — ABNORMAL LOW (ref 3.87–5.11)
RDW: 12.5 % (ref 11.5–15.5)
WBC: 4.7 10*3/uL (ref 4.0–10.5)
nRBC: 0 % (ref 0.0–0.2)

## 2022-10-24 LAB — BASIC METABOLIC PANEL
Anion gap: 6 (ref 5–15)
BUN: 17 mg/dL (ref 8–23)
CO2: 24 mmol/L (ref 22–32)
Calcium: 8.7 mg/dL — ABNORMAL LOW (ref 8.9–10.3)
Chloride: 109 mmol/L (ref 98–111)
Creatinine, Ser: 0.85 mg/dL (ref 0.44–1.00)
GFR, Estimated: >60 mL/min (ref >60)
Glucose, Bld: 98 mg/dL (ref 70–99)
Potassium: 3.6 mmol/L (ref 3.5–5.1)
Sodium: 139 mmol/L (ref 135–145)

## 2022-10-24 NOTE — Discharge Summary (Signed)
Physician Discharge Summary   Patient: April [name] MRN: [id number]  DOB: [birth year]   Admit:     Date of Admission: 10/22/2022 Admitted from: home   Discharge: Date of discharge: 10/24/22 Disposition: Home Condition at discharge: good  CODE STATUS: FULL CODE     Discharge Physician: Sunnie Nielsen, DO [facility]     PCP: Marina Goodell, MD  Recommendations for Outpatient Follow-up:  Follow up with PCP Marina Goodell, MD in 1-2 weeks Follow-up with cardiology to review cardiac monitor Please obtain labs/tests: Continuous cardiac monitoring to be arranged by [facility] cardiology Please follow up on the following pending results: cardiac monitor  PCP AND OTHER OUTPATIENT PROVIDERS: SEE BELOW FOR SPECIFIC DISCHARGE INSTRUCTIONS PRINTED FOR PATIENT IN ADDITION TO GENERIC AVS PATIENT INFO         Discharge Diagnoses: Principal Problem:   Syncope Active Problems:   History of pulmonary embolus (PE)   Prothrombin gene mutation (HCC)   Coronary artery disease   Essential hypertension   HLD (hyperlipidemia)       Hospital Course: April [name] is a 77 y.o. female with medical history significant of CAD, hx MI, PE s/p IVC filter, HTN, HLD who presents after syncopal episode at home.  She was standing in the bathroom, just finished brushing her teeth when she felt sudden nausea.  She called out to her partner, but then passed out.  Partner was at patient's side, states that she had loss of consciousness for about 30 seconds.  When she woke up, she had no episodes of confusion or postictal signs.  EMS was called.  Patient states that she has been feeling relatively well.  She recently underwent right knee surgery for osteoarthritis.  In the emergency department, had another episode where she felt nausea, by the time RN returned with a bag, she was unresponsive again.  This episode also lasted about 30 seconds without postictal signs.   06/26:  in ED, Labs overall unremarkable, troponin negative. CT head, cervical spine, maxillofacial showed comminuted nasal fracture, without other acute or traumatic finding. Right knee x-ray negative. Left hand x-ray revealed severe osteoarthritis. Admitted to hospitalist service, telemetry, Echo, cardiology consult, plan EEG. CTA chest no PE. ENT consult - follow up in 1 week outpatient.  06/27: EEG no concerns. Per cardiology - Echo and if telemetry does not show evidence of arrhythmia, would arrange 2-week outpatient monitor.  06/28: Echo EF 65-70%, normal LV fxn, normal RV, no significant valvular disease. US carotid no significant stenosis.  Discharged to follow with cardiac monitor/cardiology  Consultants:  Cardiology  ENT  Procedures: none      ASSESSMENT & PLAN:  Syncope History c/w vasovagal but uncertain cause/trigger  Telemetry, EEG, Echo, carotid US, neuroimaging - no concerns Follow cardiac monitor   Hx PE with prothrombin gene mutation -Provoked PE diagnosed in 03/2015 --> off Eliquis  S/p IVC filter 07/08/22 --> attempted removal 09/23/22 unsuccessful follow w/ vascular surgery -I confirmed with their office that they will follow up with a call to the patient   HLD Lipitor     CAD Aspirin   Nasal fracture per Dr. Willeen Cass (ENT), follow up in office in 1 week. Info placed in AVS. Can ice prn for swelling    Right hand pain Xray no fx  Pain control / ice as needed            Discharge Instructions  Allergies as of 10/24/2022  Reactions   Antihistamines, Chlorpheniramine-type Other (See Comments)   "think crazy thoughts"   Ciprofloxacin Other (See Comments)   Patient cannot remember reaction   Dristan Cold [chlorphen-pe-acetaminophen] Other (See Comments)   "think crazy thoughts"   Ginger Diarrhea   Nitrofurantoin Nausea Only   Celecoxib Rash        Medication List     TAKE these medications    acetaminophen 500 MG tablet Commonly known  as: TYLENOL Take 2 tablets (1,000 mg total) by mouth every 8 (eight) hours.   aspirin EC 81 MG tablet Take 81 mg by mouth.   atenolol 25 MG tablet Commonly known as: TENORMIN Take 1 tablet by mouth daily.   atorvastatin 40 MG tablet Commonly known as: LIPITOR Take 40 mg by mouth daily.   calcium citrate-vitamin D 315-200 MG-UNIT tablet Commonly known as: CITRACAL+D Take 1 tablet by mouth 2 (two) times daily.   EPINEPHrine 0.3 mg/0.3 mL Soaj injection Commonly known as: EpiPen 2-Pak Inject 0.3 mLs (0.3 mg total) into the muscle as needed for anaphylaxis.   Frankincense Uplifting Oil Inhale into the lungs.   QC TUMERIC COMPLEX PO Take 1,000 mg by mouth daily at 2 PM.   Vitamin D3 50 MCG (2000 UT) capsule Take 1 capsule by mouth daily.         Follow-up Information     Geanie Logan, MD. Schedule an appointment as soon as possible for a visit in 1 week(s).   Specialty: Otolaryngology Why: Follow up regarding nasal fracture Contact information: [address] [place] [address] [phone]         [facility]. Call.   Specialty: Cardiology Why: confirm heart monitor and confirm follow up appointment to review heart monitor results Contact information: [address] [id number] [place] [address] [phone]                Allergies  Allergen Reactions   Antihistamines, Chlorpheniramine-Type Other (See Comments)    "think crazy thoughts"   Ciprofloxacin Other (See Comments)    Patient cannot remember reaction   Dristan Cold [Chlorphen-Pe-Acetaminophen] Other (See Comments)    "think crazy thoughts"   Ginger Diarrhea   Nitrofurantoin Nausea Only   Celecoxib Rash     Subjective: Further episodes, no palpitations, no dizziness, no CP/SOB.  Patient has no concerns and is asking about discharge,   Discharge Exam: BP (!) 171/75 (BP Location: Right Arm)   Pulse  (!) 58   Temp 97.8 F (36.6 C) (Oral)   Resp 17   Ht 5\' 7"  (1.702 m)   Wt 89.1 kg   SpO2 99%   BMI 30.77 kg/m  General: Pt is alert, awake, not in acute distress Cardiovascular: RRR, S1/S2 +, no rubs, no gallops Respiratory: CTA bilaterally, no wheezing, no rhonchi Abdominal: Soft, NT, ND, bowel sounds + Extremities: no edema, no cyanosis     The results of significant diagnostics from this hospitalization (including imaging, microbiology, ancillary and laboratory) are listed below for reference.     Microbiology: No results found for this or any previous visit (from the past 240 hour(s)).   Labs: BNP (last 3 results) No results for input(s): "BNP" in the last 8760 hours. Basic Metabolic Panel: Recent Labs  Lab 10/22/22 0945 10/22/22 1513 10/23/22 0405 10/24/22 0825  NA 136  --  139 139  K 4.2  --  3.7 3.6  CL 106  --  108 109  CO2 18*  --  24 24  GLUCOSE 132*  --  128* 98  BUN 21  --  18 17  CREATININE 0.89  --  0.90 0.85  CALCIUM 9.4  --  8.2* 8.7*  MG  --  2.0  --   --    Liver Function Tests: Recent Labs  Lab 10/23/22 0405  AST 18  ALT 15  ALKPHOS 71  BILITOT 1.2  PROT 6.3*  ALBUMIN 3.5   No results for input(s): "LIPASE", "AMYLASE" in the last 168 hours. No results for input(s): "AMMONIA" in the last 168 hours. CBC: Recent Labs  Lab 10/22/22 0945 10/23/22 0405 10/24/22 0825  WBC 7.4 5.8 4.7  HGB 13.4 10.3* 10.4*  HCT 42.9 32.8* 31.7*  MCV 95.1 95.3 92.4  PLT 272 222 206   Cardiac Enzymes: No results for input(s): "CKTOTAL", "CKMB", "CKMBINDEX", "TROPONINI" in the last 168 hours. BNP: Invalid input(s): "POCBNP" CBG: No results for input(s): "GLUCAP" in the last 168 hours. D-Dimer Recent Labs    10/22/22 1513  DDIMER 2.35*   Hgb A1c No results for input(s): "HGBA1C" in the last 72 hours. Lipid Profile No results for input(s): "CHOL", "HDL", "LDLCALC", "TRIG", "CHOLHDL", "LDLDIRECT" in the last 72 hours. Thyroid function  studies Recent Labs    10/22/22 1513  TSH 1.624   Anemia work up No results for input(s): "VITAMINB12", "FOLATE", "FERRITIN", "TIBC", "IRON", "RETICCTPCT" in the last 72 hours. Urinalysis    Component Value Date/Time   COLORURINE YELLOW (A) 07/11/2022 0953   APPEARANCEUR CLEAR (A) 07/11/2022 0953   APPEARANCEUR Clear 12/19/2020 1518   LABSPEC 1.015 07/11/2022 0953   PHURINE 5.0 07/11/2022 0953   GLUCOSEU NEGATIVE 07/11/2022 0953   HGBUR NEGATIVE 07/11/2022 0953   BILIRUBINUR NEGATIVE 07/11/2022 0953   BILIRUBINUR Negative 12/19/2020 1518   KETONESUR NEGATIVE 07/11/2022 0953   PROTEINUR NEGATIVE 07/11/2022 0953   NITRITE NEGATIVE 07/11/2022 0953   LEUKOCYTESUR NEGATIVE 07/11/2022 0953   Sepsis Labs Recent Labs  Lab 10/22/22 0945 10/23/22 0405 10/24/22 0825  WBC 7.4 5.8 4.7   Microbiology No results found for this or any previous visit (from the past 240 hour(s)). Imaging ECHOCARDIOGRAM COMPLETE  Result Date: 10/24/2022    ECHOCARDIOGRAM REPORT   Patient Name:   ANISTASIA SILVERA [place] Date of Exam: 10/24/2022 Medical Rec #:  [id number]            Height:       67.0 in Accession #:    [id number]           Weight:       196.4 lb Date of Birth:  08/09/1945             BSA:          2.007 m Patient Age:    76 years             BP:           164/58 mmHg Patient Gender: F                    HR:           67 bpm. Exam Location:  [facility] Procedure: 2D Echo, Cardiac Doppler, Color Doppler and Strain Analysis Indications:     Syncpe  History:         Patient has no prior history of Echocardiogram examinations.                  Previous Myocardial Infarction; Signs/Symptoms:Syncope.  Sonographer:  Mikki Harbor Referring Phys:  [id number] JENNIFER CHOI Diagnosing Phys: Yvonne Kendall MD IMPRESSIONS  1. Left ventricular ejection fraction, by estimation, is 65 to 70%. The left ventricle has normal function. The left ventricle has no regional wall motion abnormalities. Left ventricular diastolic  parameters were normal. The average left ventricular global longitudinal strain is -18.4 %. The global longitudinal strain is normal.  2. Right ventricular systolic function is normal. The right ventricular size is normal. Tricuspid regurgitation signal is inadequate for assessing PA pressure.  3. The mitral valve is normal in structure. Trivial mitral valve regurgitation. No evidence of mitral stenosis.  4. The aortic valve is tricuspid. Aortic valve regurgitation is mild. No aortic stenosis is present.  5. Pulmonic valve regurgitation is moderate. FINDINGS  Left Ventricle: Left ventricular ejection fraction, by estimation, is 65 to 70%. The left ventricle has normal function. The left ventricle has no regional wall motion abnormalities. The average left ventricular global longitudinal strain is -18.4 %. The global longitudinal strain is normal. The left ventricular internal cavity size was normal in size. There is borderline left ventricular hypertrophy. Left ventricular diastolic parameters were normal. Right Ventricle: The right ventricular size is normal. No increase in right ventricular wall thickness. Right ventricular systolic function is normal. Tricuspid regurgitation signal is inadequate for assessing PA pressure. Left Atrium: Left atrial size was normal in size. Right Atrium: Right atrial size was normal in size. Pericardium: There is no evidence of pericardial effusion. Mitral Valve: The mitral valve is normal in structure. Trivial mitral valve regurgitation. No evidence of mitral valve stenosis. MV peak gradient, 6.2 mmHg. The mean mitral valve gradient is 2.0 mmHg. Tricuspid Valve: The tricuspid valve is normal in structure. Tricuspid valve regurgitation is trivial. Aortic Valve: The aortic valve is tricuspid. Aortic valve regurgitation is mild. No aortic stenosis is present. Aortic valve mean gradient measures 5.0 mmHg. Aortic valve peak gradient measures 9.1 mmHg. Aortic valve area, by VTI measures  2.99 cm. Pulmonic Valve: The pulmonic valve was normal in structure. Pulmonic valve regurgitation is moderate. Aorta: The aortic root and ascending aorta are structurally normal, with no evidence of dilitation. Venous: The inferior vena cava was not well visualized. IAS/Shunts: The interatrial septum was not well visualized.  LEFT VENTRICLE PLAX 2D LVIDd:         4.70 cm   Diastology LVIDs:         2.40 cm   LV e' medial:    9.03 cm/s LV PW:         1.03 cm   LV E/e' medial:  13.8 LV IVS:        1.00 cm   LV e' lateral:   11.30 cm/s LVOT diam:     2.00 cm   LV E/e' lateral: 11.1 LV SV:         118 LV SV Index:   59        2D Longitudinal Strain LVOT Area:     3.14 cm  2D Strain GLS Avg:     -18.4 %  RIGHT VENTRICLE RV Basal diam:  2.95 cm RV Mid diam:    2.80 cm RV S prime:     14.10 cm/s TAPSE (M-mode): 2.3 cm LEFT ATRIUM             Index        RIGHT ATRIUM           Index LA diam:        3.50 cm 1.74  cm/m   RA Area:     14.50 cm LA Vol (A2C):   63.8 ml 31.79 ml/m  RA Volume:   31.70 ml  15.80 ml/m LA Vol (A4C):   58.3 ml 29.05 ml/m LA Biplane Vol: 63.7 ml 31.74 ml/m  AORTIC VALVE                     PULMONIC VALVE AV Area (Vmax):    2.60 cm      PV Vmax:       1.00 m/s AV Area (Vmean):   2.68 cm      PV Peak grad:  4.0 mmHg AV Area (VTI):     2.99 cm AV Vmax:           151.00 cm/s AV Vmean:          110.000 cm/s AV VTI:            0.395 m AV Peak Grad:      9.1 mmHg AV Mean Grad:      5.0 mmHg LVOT Vmax:         125.00 cm/s LVOT Vmean:        93.700 cm/s LVOT VTI:          0.376 m LVOT/AV VTI ratio: 0.95  AORTA Ao Root diam: 2.90 cm Ao Asc diam:  2.70 cm MITRAL VALVE MV Area (PHT): 3.83 cm     SHUNTS MV Area VTI:   3.38 cm     Systemic VTI:  0.38 m MV Peak grad:  6.2 mmHg     Systemic Diam: 2.00 cm MV Mean grad:  2.0 mmHg MV Vmax:       1.24 m/s MV Vmean:      68.5 cm/s MV Decel Time: 198 msec MV E velocity: 125.00 cm/s MV A velocity: 105.00 cm/s MV E/A ratio:  1.19 Cristal Deer End MD Electronically  signed by Yvonne Kendall MD Signature Date/Time: 10/24/2022/10:12:40 AM    Final       Time coordinating discharge: over 30 minutes  SIGNED:  Sunnie Nielsen DO [facility]

## 2022-10-24 NOTE — Progress Notes (Signed)
*  PRELIMINARY RESULTS* Echocardiogram 2D Echocardiogram has been performed.  Carolyne Fiscal 10/24/2022, 9:50 AM

## 2022-10-24 NOTE — Telephone Encounter (Signed)
Pt is returning call.  

## 2022-10-24 NOTE — Progress Notes (Signed)
Furth, Cadence H, PA-C  P C[facility] Scheduling; P Cv [facility] Triage Patient needs a 14 day heart monitor for syncope. Also needs 2-4 weeks follow-up in the cardiology office  ZIO heart monitor ordered

## 2022-10-24 NOTE — Telephone Encounter (Signed)
-----   Message from Lenor Derrick, RN sent at 10/24/2022  2:25 PM EDT ----- Regarding: RE: cardaic monitor Monitor ordered ----- Message ----- From: Marianne Sofia, PA-C Sent: 10/24/2022   1:55 PM EDT To: C[facility] Triage; Cv Div [facility] Scheduling Subject: cardaic monitor                                Patient needs a 14 day heart monitor for syncope. Also needs 2-4 weeks follow-up in the cardiology office.

## 2022-10-24 NOTE — Progress Notes (Signed)
Rounding Note    Patient Name: April [name] Date of Encounter: 10/24/2022  [facility] Cardiologist: [facility]   Subjective   The patient is overall feeling better. She has been up in the room with help. No dizziness or lightheadedness. No chest pain. Tele is unremarkable. Echo pending. AM labs pending.  Inpatient Medications    Scheduled Meds:  aspirin EC  81 mg Oral Daily   atenolol  25 mg Oral Daily   atorvastatin  40 mg Oral Daily   enoxaparin (LOVENOX) injection  0.5 mg/kg Subcutaneous Q24H   sodium chloride flush  3 mL Intravenous Q12H   Continuous Infusions:  PRN Meds: acetaminophen **OR** acetaminophen, fentaNYL (SUBLIMAZE) injection, HYDROcodone-acetaminophen, ondansetron **OR** ondansetron (ZOFRAN) IV   Vital Signs    Vitals:   10/23/22 0815 10/23/22 2000 10/24/22 0451 10/24/22 0742  BP: (!) 152/65 (!) 144/70 (!) 156/60 (!) 164/58  Pulse: 64 (!) 59 (!) 58 (!) 55  Resp: 18 18 18 16   Temp: 98.4 F (36.9 C) 98.2 F (36.8 C)  97.9 F (36.6 C)  TempSrc: Oral Oral    SpO2: 97% 95% 99% 95%  Weight:      Height:        Intake/Output Summary (Last 24 hours) at 10/24/2022 0745 Last data filed at 10/23/2022 1838 Gross per 24 hour  Intake 1498.01 ml  Output --  Net 1498.01 ml      10/23/2022    3:15 AM 10/22/2022    9:41 AM 10/16/2022    1:41 PM  Last 3 Weights  Weight (lbs) 196 lb 6.9 oz 188 lb 191 lb 3.2 oz  Weight (kg) 89.1 kg 85.276 kg 86.728 kg      Telemetry    SB HR 50-60s, brief SVT - Personally Reviewed  ECG    No new - Personally Reviewed  Physical Exam   GEN: No acute distress.   Neck: No JVD Cardiac: RRR, no murmurs, rubs, or gallops.  Respiratory: Clear to auscultation bilaterally. GI: Soft, nontender, non-distended  MS: No edema; No deformity. Neuro:  Nonfocal  Psych: Normal affect   Labs    High Sensitivity Troponin:   Recent Labs  Lab 10/22/22 0945 10/22/22 1143  TROPONINIHS 5 6      Chemistry Recent Labs  Lab 10/22/22 0945 10/22/22 1513 10/23/22 0405  NA 136  --  139  K 4.2  --  3.7  CL 106  --  108  CO2 18*  --  24  GLUCOSE 132*  --  128*  BUN 21  --  18  CREATININE 0.89  --  0.90  CALCIUM 9.4  --  8.2*  MG  --  2.0  --   PROT  --   --  6.3*  ALBUMIN  --   --  3.5  AST  --   --  18  ALT  --   --  15  ALKPHOS  --   --  71  BILITOT  --   --  1.2  GFRNONAA >60  --  >60  ANIONGAP 12  --  7    Lipids No results for input(s): "CHOL", "TRIG", "HDL", "LABVLDL", "LDLCALC", "CHOLHDL" in the last 168 hours.  Hematology Recent Labs  Lab 10/22/22 0945 10/23/22 0405  WBC 7.4 5.8  RBC 4.51 3.44*  HGB 13.4 10.3*  HCT 42.9 32.8*  MCV 95.1 95.3  MCH 29.7 29.9  MCHC 31.2 31.4  RDW 12.5 12.8  PLT 272 222  Thyroid  Recent Labs  Lab 10/22/22 1513  TSH 1.624    BNPNo results for input(s): "BNP", "PROBNP" in the last 168 hours.  DDimer  Recent Labs  Lab 10/22/22 1513  DDIMER 2.35*     Radiology    EEG adult  Result Date: 10/23/2022 Charlsie Quest, MD     10/23/2022  1:14 PM Patient Name: Iretta Burgueno MRN: [id number] Epilepsy Attending: Charlsie Quest Referring Physician/Provider: Noralee Stain, DO Date: 10/23/2022 Duration: 32.44 mins Patient history: 77yo f with syncope. EEG to evaluate for seizure Level of alertness: Awake, asleep AEDs during EEG study: None Technical aspects: This EEG study was done with scalp electrodes positioned according to the 10-20 International system of electrode placement. Electrical activity was reviewed with band pass filter of 1-70Hz , sensitivity of 7 uV/mm, display speed of 56mm/sec with a 60Hz  notched filter applied as appropriate. EEG data were recorded continuously and digitally stored.  Video monitoring was available and reviewed as appropriate. Description: The posterior dominant rhythm consists of 8 Hz activity of moderate voltage (25-35 uV) seen predominantly in posterior head regions, symmetric and  reactive to eye opening and eye closing. Sleep was characterized by vertex waves, sleep spindles (12 to 14 Hz), maximal frontocentral region. Hyperventilation and photic stimulation were not performed.   IMPRESSION: This study is within normal limits. No seizures or epileptiform discharges were seen throughout the recording. A normal interictal EEG does not exclude the diagnosis of epilepsy. Priyanka Annabelle Harman   US Venous Img Lower Bilateral (DVT)  Result Date: 10/23/2022 CLINICAL DATA:  Elevated D-dimer. History of pulmonary embolism. Recent right knee surgery (07/21/2022). On anticoagulation. Evaluate for DVT. EXAM: BILATERAL LOWER EXTREMITY VENOUS DOPPLER ULTRASOUND TECHNIQUE: Gray-scale sonography with graded compression, as well as color Doppler and duplex ultrasound were performed to evaluate the lower extremity deep venous systems from the level of the common femoral vein and including the common femoral, femoral, profunda femoral, popliteal and calf veins including the posterior tibial, peroneal and gastrocnemius veins when visible. The superficial great saphenous vein was also interrogated. Spectral Doppler was utilized to evaluate flow at rest and with distal augmentation maneuvers in the common femoral, femoral and popliteal veins. COMPARISON:  None Available. FINDINGS: RIGHT LOWER EXTREMITY Common Femoral Vein: No evidence of thrombus. Normal compressibility, respiratory phasicity and response to augmentation. Saphenofemoral Junction: No evidence of thrombus. Normal compressibility and flow on color Doppler imaging. Profunda Femoral Vein: No evidence of thrombus. Normal compressibility and flow on color Doppler imaging. Femoral Vein: No evidence of thrombus. Normal compressibility, respiratory phasicity and response to augmentation. Popliteal Vein: No evidence of thrombus. Normal compressibility, respiratory phasicity and response to augmentation. Calf Veins: No evidence of thrombus. Normal  compressibility and flow on color Doppler imaging. Superficial Great Saphenous Vein: No evidence of thrombus. Normal compressibility. Other Findings:  None. LEFT LOWER EXTREMITY Common Femoral Vein: No evidence of thrombus. Normal compressibility, respiratory phasicity and response to augmentation. Saphenofemoral Junction: No evidence of thrombus. Normal compressibility and flow on color Doppler imaging. Profunda Femoral Vein: No evidence of thrombus. Normal compressibility and flow on color Doppler imaging. Femoral Vein: No evidence of thrombus. Normal compressibility, respiratory phasicity and response to augmentation. Popliteal Vein: No evidence of thrombus. Normal compressibility, respiratory phasicity and response to augmentation. Calf Veins: No evidence of thrombus. Normal compressibility and flow on color Doppler imaging. Superficial Great Saphenous Vein: No evidence of thrombus. Normal compressibility. Other Findings:  None. IMPRESSION: No evidence of acute or chronic DVT within either lower  extremity. Electronically Signed   By: Simonne Come M.D.   On: 10/23/2022 12:46   CT Angio Chest Pulmonary Embolism (PE) W or WO Contrast  Result Date: 10/23/2022 CLINICAL DATA:  77 year old female status post recent lower extremity surgery. Syncope, fall, abnormal D-dimer. History of pulmonary emboli in 2016. EXAM: CT ANGIOGRAPHY CHEST WITH CONTRAST TECHNIQUE: Multidetector CT imaging of the chest was performed using the standard protocol during bolus administration of intravenous contrast. Multiplanar CT image reconstructions and MIPs were obtained to evaluate the vascular anatomy. RADIATION DOSE REDUCTION: This exam was performed according to the departmental dose-optimization program which includes automated exposure control, adjustment of the mA and/or kV according to patient size and/or use of iterative reconstruction technique. CONTRAST:  75mL OMNIPAQUE IOHEXOL 350 MG/ML SOLN COMPARISON:  Cervical spine CT  yesterday. Chest CTA 04/14/2015. CT Abdomen and Pelvis 12/08/2020. FINDINGS: Cardiovascular: Excellent contrast bolus timing in the pulmonary arterial tree. Mild respiratory motion at the lung bases. The 2016 pulmonary artery thrombus appears resolved and no pulmonary artery filling defect is identified. Calcified aortic atherosclerosis. Mild pectus excavatum. Heart size is stable since 2016. No pericardial effusion. Calcified coronary artery atherosclerosis. Mediastinum/Nodes: Negative. No mediastinal mass or lymphadenopathy. Lungs/Pleura: Mildly lower lung volumes compared to 2016. Major airways remain patent. Symmetric dependent mostly sub solid lung opacity compatible with atelectasis. Mild pulmonary mosaic attenuation otherwise, probably gas trapping. No pleural effusion or consolidation. Upper Abdomen: Chronic cholecystectomy. Visible early postcontrast appearance of the liver, spleen, pancreas, adrenal glands, and kidneys within normal limits. Nondilated bowel in the upper abdomen with no free air or free fluid identified. Musculoskeletal: No acute osseous abnormality identified. Review of the MIP images confirms the above findings. IMPRESSION: 1. Negative for acute pulmonary embolus. 2. Pulmonary atelectasis and gas trapping. 3. Calcified coronary artery and Aortic Atherosclerosis (ICD10-I70.0). Electronically Signed   By: Odessa Fleming M.D.   On: 10/23/2022 07:01   DG Hand Complete Right  Result Date: 10/22/2022 CLINICAL DATA:  Larey Seat this morning, right hand pain EXAM: RIGHT HAND - COMPLETE 3+ VIEW COMPARISON:  None Available. FINDINGS: Frontal, oblique, and lateral views of the right hand are obtained. No acute fracture, subluxation, or dislocation. There is diffuse osteoarthritis, most pronounced at the first carpometacarpal joint. Soft tissues are unremarkable. IMPRESSION: 1. No acute fracture. 2. Diffuse osteoarthritis greatest at the first carpometacarpal joint. Electronically Signed   By: Sharlet Salina  M.D.   On: 10/22/2022 16:09   CT HEAD WO CONTRAST ( )  Result Date: 10/22/2022 CLINICAL DATA:  Larey Seat in the bathroom with trauma to the head, face and neck. EXAM: CT HEAD WITHOUT CONTRAST CT MAXILLOFACIAL WITHOUT CONTRAST CT CERVICAL SPINE WITHOUT CONTRAST TECHNIQUE: Multidetector CT imaging of the head, cervical spine, and maxillofacial structures were performed using the standard protocol without intravenous contrast. Multiplanar CT image reconstructions of the cervical spine and maxillofacial structures were also generated. RADIATION DOSE REDUCTION: This exam was performed according to the departmental dose-optimization program which includes automated exposure control, adjustment of the mA and/or kV according to patient size and/or use of iterative reconstruction technique. COMPARISON:  None Available. FINDINGS: CT HEAD FINDINGS Brain: Age related volume loss. 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 No sign of acute infarction, mass lesion, hemorrhage, hydrocephalus or extra-axial collection. Vascular: There is atherosclerotic calcification of the major vessels at the base of the brain. Skull: Negative Other: None CT MAXILLOFACIAL FINDINGS Osseous: Comminuted nasal fractures with slight displacement towards the left. Fracture of the nasal septum. Fracture of the nasal spine  of the maxilla. No other facial fracture. Orbits: No intraorbital injury. Sinuses: Clear except for some layering fluid in the right maxillary sinus, likely related to the nasal fractures. Insignificant retention cyst at the right maxillary sinus floor. Soft tissues: Soft tissue swelling in the region of the nose. CT CERVICAL SPINE FINDINGS Alignment: Straightening of the normal cervical lordosis. Mild curvature convex to the left. Skull base and vertebrae: No regional fracture. Soft tissues and spinal canal: No soft tissue neck injury seen. Extensive calcification of the left proximal vertebral artery incidentally  noted. Disc levels: The foramen magnum is widely patent. There is ordinary mild osteoarthritis of the C1-2 articulation but no encroachment upon the neural structures. C2-3: Normal C3-4: Bilateral facet osteoarthritis.  No stenosis. C4-5: Facet osteoarthritis on the right. Mild spondylosis. No compressive stenosis. C5-6: Chronic disc degeneration with loss of disc height and endplate osteophytes. Mild bony foraminal narrowing. C6-7: Chronic disc degeneration with loss of disc height and endplate osteophytes. Mild bony foraminal narrowing. C7-T1: Normal Upper chest: Negative Other: None IMPRESSION: HEAD CT: No acute or traumatic finding. Age related volume loss. 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 MAXILLOFACIAL CT: Comminuted nasal fractures with slight displacement towards the left. Fracture of the nasal septum. Fracture of the nasal spine of the maxilla. No other facial fracture. CERVICAL SPINE CT: No acute or traumatic finding. Chronic degenerative changes as outlined above. Electronically Signed   By: Paulina Fusi M.D.   On: 10/22/2022 11:17   CT Cervical Spine Wo Contrast  Result Date: 10/22/2022 CLINICAL DATA:  Larey Seat in the bathroom with trauma to the head, face and neck. EXAM: CT HEAD WITHOUT CONTRAST CT MAXILLOFACIAL WITHOUT CONTRAST CT CERVICAL SPINE WITHOUT CONTRAST TECHNIQUE: Multidetector CT imaging of the head, cervical spine, and maxillofacial structures were performed using the standard protocol without intravenous contrast. Multiplanar CT image reconstructions of the cervical spine and maxillofacial structures were also generated. RADIATION DOSE REDUCTION: This exam was performed according to the departmental dose-optimization program which includes automated exposure control, adjustment of the mA and/or kV according to patient size and/or use of iterative reconstruction technique. COMPARISON:  None Available. FINDINGS: CT HEAD FINDINGS Brain: Age related volume loss. Old right  occipital cortical and subcortical infarction. No sign of acute infarction, mass lesion, hemorrhage, hydrocephalus or extra-axial collection. Vascular: There is atherosclerotic calcification of the major vessels at the base of the brain. Skull: Negative Other: None CT MAXILLOFACIAL FINDINGS Osseous: Comminuted nasal fractures with slight displacement towards the left. Fracture of the nasal septum. Fracture of the nasal spine of the maxilla. No other facial fracture. Orbits: No intraorbital injury. Sinuses: Clear except for some layering fluid in the right maxillary sinus, likely related to the nasal fractures. Insignificant retention cyst at the right maxillary sinus floor. Soft tissues: Soft tissue swelling in the region of the nose. CT CERVICAL SPINE FINDINGS Alignment: Straightening of the normal cervical lordosis. Mild curvature convex to the left. Skull base and vertebrae: No regional fracture. Soft tissues and spinal canal: No soft tissue neck injury seen. Extensive calcification of the left proximal vertebral artery incidentally noted. Disc levels: The foramen magnum is widely patent. There is ordinary mild osteoarthritis of the C1-2 articulation but no encroachment upon the neural structures. C2-3: Normal C3-4: Bilateral facet osteoarthritis.  No stenosis. C4-5: Facet osteoarthritis on the right. Mild spondylosis. No compressive stenosis. C5-6: Chronic disc degeneration with loss of disc height and endplate osteophytes. Mild bony foraminal narrowing. C6-7: Chronic disc degeneration with loss of disc height  and endplate osteophytes. Mild bony foraminal narrowing. C7-T1: Normal Upper chest: Negative Other: None IMPRESSION: HEAD CT: No acute or traumatic finding. Age related volume loss. 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 MAXILLOFACIAL CT: Comminuted nasal fractures with slight displacement towards the left. Fracture of the nasal septum. Fracture of the nasal spine of the maxilla. No other  facial fracture. CERVICAL SPINE CT: No acute or traumatic finding. Chronic degenerative changes as outlined above. Electronically Signed   By: Paulina Fusi M.D.   On: 10/22/2022 11:17   CT Maxillofacial Wo Contrast  Result Date: 10/22/2022 CLINICAL DATA:  Larey Seat in the bathroom with trauma to the head, face and neck. EXAM: CT HEAD WITHOUT CONTRAST CT MAXILLOFACIAL WITHOUT CONTRAST CT CERVICAL SPINE WITHOUT CONTRAST TECHNIQUE: Multidetector CT imaging of the head, cervical spine, and maxillofacial structures were performed using the standard protocol without intravenous contrast. Multiplanar CT image reconstructions of the cervical spine and maxillofacial structures were also generated. RADIATION DOSE REDUCTION: This exam was performed according to the departmental dose-optimization program which includes automated exposure control, adjustment of the mA and/or kV according to patient size and/or use of iterative reconstruction technique. COMPARISON:  None Available. FINDINGS: CT HEAD FINDINGS Brain: Age related volume loss. 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 No sign of acute infarction, mass lesion, hemorrhage, hydrocephalus or extra-axial collection. Vascular: There is atherosclerotic calcification of the major vessels at the base of the brain. Skull: Negative Other: None CT MAXILLOFACIAL FINDINGS Osseous: Comminuted nasal fractures with slight displacement towards the left. Fracture of the nasal septum. Fracture of the nasal spine of the maxilla. No other facial fracture. Orbits: No intraorbital injury. Sinuses: Clear except for some layering fluid in the right maxillary sinus, likely related to the nasal fractures. Insignificant retention cyst at the right maxillary sinus floor. Soft tissues: Soft tissue swelling in the region of the nose. CT CERVICAL SPINE FINDINGS Alignment: Straightening of the normal cervical lordosis. Mild curvature convex to the left. Skull base and vertebrae: No  regional fracture. Soft tissues and spinal canal: No soft tissue neck injury seen. Extensive calcification of the left proximal vertebral artery incidentally noted. Disc levels: The foramen magnum is widely patent. There is ordinary mild osteoarthritis of the C1-2 articulation but no encroachment upon the neural structures. C2-3: Normal C3-4: Bilateral facet osteoarthritis.  No stenosis. C4-5: Facet osteoarthritis on the right. Mild spondylosis. No compressive stenosis. C5-6: Chronic disc degeneration with loss of disc height and endplate osteophytes. Mild bony foraminal narrowing. C6-7: Chronic disc degeneration with loss of disc height and endplate osteophytes. Mild bony foraminal narrowing. C7-T1: Normal Upper chest: Negative Other: None IMPRESSION: HEAD CT: No acute or traumatic finding. Age related volume loss. 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 MAXILLOFACIAL CT: Comminuted nasal fractures with slight displacement towards the left. Fracture of the nasal septum. Fracture of the nasal spine of the maxilla. No other facial fracture. CERVICAL SPINE CT: No acute or traumatic finding. Chronic degenerative changes as outlined above. Electronically Signed   By: Paulina Fusi M.D.   On: 10/22/2022 11:17   DG Knee Complete 4 Views Right  Result Date: 10/22/2022 CLINICAL DATA:  Right knee pain after fall today. EXAM: RIGHT KNEE - COMPLETE 4+ VIEW COMPARISON:  July 21, 2022. FINDINGS: Status post right total knee arthroplasty. No fracture or dislocation is noted. Vascular calcifications are noted. IMPRESSION: No acute abnormality seen. Electronically Signed   By: Lupita Raider M.D.   On: 10/22/2022 10:40   DG Hand Complete  Left  Result Date: 10/22/2022 CLINICAL DATA:  Left hand pain after fall today. EXAM: LEFT HAND - COMPLETE 3+ VIEW COMPARISON:  None Available. FINDINGS: There is no evidence of fracture or dislocation. Severe degenerative changes seen involving the first carpometacarpal  joint as well as the fourth and fifth distal interphalangeal joints. Soft tissues are unremarkable. IMPRESSION: Severe osteoarthritis of multiple joints as described above. No acute abnormality seen. Electronically Signed   By: Lupita Raider M.D.   On: 10/22/2022 10:39    Cardiac Studies   Echo 05/2022   Summary   1. The left ventricle is normal in size with mildly increased wall  thickness.   2. The left ventricular systolic function is normal, LVEF is visually  estimated at 60-65%.    3. The aortic valve is trileaflet with mildly thickened leaflets with normal  excursion.   4. There is mild aortic regurgitation.    5. The right ventricle is normal in size, with normal systolic function.    Interventions / Surgery: LHC 10/06 - 90% dLCx, 20% pRCA, 40% ostial D1, anterolateral and mid inferior hypokinesis, EF 45-50%. Cypher DES 2.5 by 13 mm to LCx.  Imaging: EKG today - Sinus bradycardia, poor R wave progression Exercise SPECT 6/12: Exercised 6:19, 7.4 METS, Peak HR 146. Stopped for dyspnea, no ischemic EKG changes. Normal myocardial perfusion study. EF 60% post stress.  Patient Profile     77 y.o. female  with a hx of CAD status post DES to left circumflex in 2006 for non-STEMI, hypertension, hyperlipidemia, prior provoked PE, status post IVC filter with unsuccessful removal, prothrombin gene mutation who is being seen 10/23/2022 for the evaluation of syncope   Assessment & Plan    Syncope - patient with fall from standing with prodrome of nausea. She was out for 30 seconds. She sustained facial and wrist issues - she was hypertensive on arrival - she reports good oral intake - EKG with NSR with TWI III. Tele so far unremarkable - Ddimer 2.35. Chest CTA with no PE - DVT study negative - orthostatics negative - echo ordered - continue telemetry - possible vasovagal in nature - She will need a heart monitor at discharge   H/o PE with prothrombin gene mutation - provoked PE in  2016 previously on Eliquis - s/p IVC filter - s/p IVF filter 06/27/22 with unsuccessful removal 09/23/22 - Chest CTA with no PE   CAD s/p PCI/DES left circumflex in 2006 - HS trop negative - echo as above - no chest pain reported - continue ASA, Atenolol, Lipitor - patient is OK to follow-up with [facility]   HLD - LDL 50 - continue Lipitor 40mg  daily   HTN - PTA atenolol - mildly elevated - may need additional med for BP support  For questions or updates, please contact  [facility] Please consult [email or web address] for contact info under        Signed, Serin Thornell David Stall, PA-C  10/24/2022, 7:45 AM

## 2022-10-24 NOTE — Telephone Encounter (Signed)
Patient advised that orders for zio monitor placed and instructions reviewed with patient and sent via MyChart. Patient verbalized understanding. No further questions. Patient advised to contact office if she has any questions.  New patient OV scheduled for 8/22. Patient states she will be on vacation until July 7/27.

## 2022-10-24 NOTE — TOC CM/SW Note (Signed)
Transition of Care [facility]) - Inpatient Brief Assessment   Patient Details  Name: April [name] MRN: [id number] Date of Birth: 06/03/1945  Transition of Care [facility]) CM/SW Contact:    Allena Katz, LCSW Phone Number: 10/24/2022, 8:06 AM   Clinical Narrative:    Transition of Care Asessment: Insurance and Status: Insurance coverage has been reviewed Patient has primary care physician: Yes Home environment has been reviewed: [address] [place] [id number] Prior level of function:: Independent with all ADLS   Social Determinants of Health Reivew: SDOH reviewed no interventions necessary Readmission risk has been reviewed: Yes Transition of care needs: no transition of care needs at this time

## 2022-10-28 DIAGNOSIS — R55 Syncope and collapse: Secondary | ICD-10-CM | POA: Diagnosis not present

## 2022-10-29 ENCOUNTER — Telehealth: Payer: Self-pay | Admitting: *Deleted

## 2022-10-29 NOTE — Telephone Encounter (Signed)
   Name: Amere Novitsky  DOB: 13-Jun-1945  MRN: [id number]  Primary Cardiologist: None   Preoperative team, please contact this patient and set up a phone call appointment for further preoperative risk assessment. Please obtain consent and complete medication review. Thank you for your help.  I confirm that guidance regarding antiplatelet and oral anticoagulation therapy has been completed and, if necessary, noted below.  Aspirin prescribed by a noncardiology provider therefore recommendation for holding deferred to prescribing provider.     Carlos Levering, NP 10/29/2022, 5:14 PM [facility]

## 2022-10-29 NOTE — Telephone Encounter (Signed)
   Pre-operative Risk Assessment    Patient Name: April [name]  DOB: February 19, 1946 MRN: [id number]      Request for Surgical Clearance    Procedure:   CLOSED REDUCTION  NASAL FRACTURE  Date of Surgery:  Clearance 11/05/22                                 Surgeon:  DR. Willeen Cass Surgeon's Group or Practice Name:  [facility]  Phone number:  [phone] Fax number:  [phone]   Type of Clearance Requested:   - Medical ; ASA    Type of Anesthesia:  General    Additional requests/questions:    Elpidio Anis   10/29/2022, 2:25 PM

## 2022-10-29 NOTE — Telephone Encounter (Signed)
Left message to call back to set up tele pre op appt.  

## 2022-10-31 ENCOUNTER — Telehealth: Payer: Self-pay | Admitting: *Deleted

## 2022-10-31 NOTE — Telephone Encounter (Signed)
Pt called back and has been scheduled for tele pre op appt 11/03/22. I used the 3 pm over book slot due to pt had appt already 2 pm and would not be able to have tele either at 2:20 or 2:40 that are left. Med rec and consent are done. Pt states she has already been holding her ASA.

## 2022-10-31 NOTE — Telephone Encounter (Signed)
Pt called back and has been scheduled for tele pre op appt 11/03/22. I used the 3 pm over book slot due to pt had appt already 2 pm and would not be able to have tele either at 2:20 or 2:40 that are left. Med rec and consent are done. Pt states she has already been holding her ASA.      Patient Consent for Virtual Visit        April [name] has provided verbal consent on 10/31/2022 for a virtual visit (video or telephone).   CONSENT FOR VIRTUAL VISIT FOR:  April [name]  By participating in this virtual visit I agree to the following:  I hereby voluntarily request, consent and authorize [facility] and its employed or contracted physicians, physician assistants, nurse practitioners or other licensed health care professionals (the Practitioner), to provide me with telemedicine health care services (the "Services") as deemed necessary by the treating Practitioner. I acknowledge and consent to receive the Services by the Practitioner via telemedicine. I understand that the telemedicine visit will involve communicating with the Practitioner through live audiovisual communication technology and the disclosure of certain medical information by electronic transmission. I acknowledge that I have been given the opportunity to request an in-person assessment or other available alternative prior to the telemedicine visit and am voluntarily participating in the telemedicine visit.  I understand that I have the right to withhold or withdraw my consent to the use of telemedicine in the course of my care at any time, without affecting my right to future care or treatment, and that the Practitioner or I may terminate the telemedicine visit at any time. I understand that I have the right to inspect all information obtained and/or recorded in the course of the telemedicine visit and may receive copies of available information for a reasonable fee.  I understand that some of the potential risks of  receiving the Services via telemedicine include:  Delay or interruption in medical evaluation due to technological equipment failure or disruption; Information transmitted may not be sufficient (e.g. poor resolution of images) to allow for appropriate medical decision making by the Practitioner; and/or  In rare instances, security protocols could fail, causing a breach of personal health information.  Furthermore, I acknowledge that it is my responsibility to provide information about my medical history, conditions and care that is complete and accurate to the best of my ability. I acknowledge that Practitioner's advice, recommendations, and/or decision may be based on factors not within their control, such as incomplete or inaccurate data provided by me or distortions of diagnostic images or specimens that may result from electronic transmissions. I understand that the practice of medicine is not an exact science and that Practitioner makes no warranties or guarantees regarding treatment outcomes. I acknowledge that a copy of this consent can be made available to me via my patient portal [facility] MyChart), or I can request a printed copy by calling the office of [facility].    I understand that my insurance will be billed for this visit.   I have read or had this consent read to me. I understand the contents of this consent, which adequately explains the benefits and risks of the Services being provided via telemedicine.  I have been provided ample opportunity to ask questions regarding this consent and the Services and have had my questions answered to my satisfaction. I give my informed consent for the services to be provided through the use of telemedicine  in my medical care

## 2022-11-01 NOTE — Progress Notes (Unsigned)
Virtual Visit via Telephone Note   Because of April [name]'s co-morbid illnesses, she is at least at moderate risk for complications without adequate follow up.  This format is felt to be most appropriate for this patient at this time.  The patient did not have access to video technology/had technical difficulties with video requiring transitioning to audio format only (telephone).  All issues noted in this document were discussed and addressed.  No physical exam could be performed with this format.  Please refer to the patient's chart for her consent to telehealth for [facility].  Evaluation Performed:  Preoperative cardiovascular risk assessment _____________   Date:  11/01/2022   Patient ID:  April [name], DOB [birth year], MRN [id number] Patient Location:  Home Provider location:   Office  Primary Care Provider:  Marina Goodell, MD Primary Cardiologist:  Debbe Odea, MD  Chief Complaint / Patient Profile   77 y.o. y/o female with a h/o CAD, hypertension, hyperlipidemia, syncope who is pending closed reduction of nasal fracture by Dr. Willeen Cass and presents today for telephonic preoperative cardiovascular risk assessment.  History of Present Illness    April [name] is a 77 y.o. female who presents via audio/video conferencing for a telehealth visit today.  Pt was last seen in in the hospital on 10/24/2022 by Cadence Furth, PA-C.  At that time she was evaluated for syncopal episode with unknown etiology but felt to possibly be vasovagal in nature.***The patient is now pending procedure as outlined above. Since patient was last seen, she ***  Heart monitor?  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  Gerarda Fraction, MD   Pulmonary embolism (HCC) 2016   Pulmonary embolism (HCC) 2016   Vertigo 1990   "inner ear"   Past Surgical History:  Procedure Laterality Date   ABDOMINAL HYSTERECTOMY     APPENDECTOMY     BREAST BIOPSY Right 04/30/2020   11:00 5cmfn Qmarker-benign, 11:00 8 cmfn twisted X marker-benign   CARDIAC CATHETERIZATION  2005   CATARACT EXTRACTION W/PHACO Left 02/08/2019   Procedure: CATARACT EXTRACTION PHACO AND INTRAOCULAR LENS PLACEMENT (IOC) LEFT 00:40.0  20.6%  8.26;  Surgeon: Galen Manila, MD;  Location: [facility];  Service: Ophthalmology;  Laterality: Left;   CATARACT EXTRACTION W/PHACO Right 04/26/2019   Procedure: CATARACT EXTRACTION PHACO AND INTRAOCULAR LENS PLACEMENT (IOC) RIGHT 6.83 00:46.0;  Surgeon: Galen Manila, MD;  Location: [facility];  Service: Ophthalmology;  Laterality: Right;   CHOLECYSTECTOMY     COLONOSCOPY  2017   FOOT SURGERY Right    IVC FILTER INSERTION N/A 07/08/2022   Procedure: IVC FILTER INSERTION;  Surgeon: Renford Dills, MD;  Location: [facility] INVASIVE CV LAB;  Service: Cardiovascular;  Laterality: N/A;   IVC FILTER REMOVAL N/A 09/23/2022   Procedure: IVC FILTER REMOVAL;  Surgeon: Renford Dills, MD;  Location: [facility] INVASIVE CV LAB;  Service: Cardiovascular;  Laterality: N/A;   TOTAL KNEE ARTHROPLASTY Right 07/21/2022   Procedure: TOTAL KNEE ARTHROPLASTY;  Surgeon:  Reinaldo Berber, MD;  Location: [facility] ORS;  Service: Orthopedics;  Laterality: Right;    Allergies  Allergies  Allergen Reactions   Antihistamines, Chlorpheniramine-Type Other (See Comments)    "think crazy thoughts"   Ciprofloxacin Other (See Comments)    Patient cannot remember reaction   Dristan Cold [Chlorphen-Pe-Acetaminophen] Other (See Comments)    "think crazy thoughts"   Ginger Diarrhea   Nitrofurantoin  Nausea Only   Celecoxib Rash    Home Medications    Prior to Admission medications   Medication Sig Start Date End Date Taking? Authorizing Provider  acetaminophen (TYLENOL) 500 MG tablet Take 2 tablets (1,000 mg total) by mouth every 8 (eight) hours. 07/22/22   Evon Slack, PA-C  aspirin EC 81 MG tablet Take 81 mg by mouth. 09/24/05   [provider]  atenolol (TENORMIN) 25 MG tablet Take 1 tablet by mouth daily. 09/22/14   [provider]  atorvastatin (LIPITOR) 40 MG tablet Take 40 mg by mouth daily.    [provider]  calcium citrate-vitamin D (CITRACAL+D) 315-200 MG-UNIT tablet Take 1 tablet by mouth 2 (two) times daily.    [provider]  Cholecalciferol (VITAMIN D3) 2000 UNITS capsule Take 1 capsule by mouth daily.    [provider]  EPINEPHrine (EPIPEN 2-PAK) 0.3 mg/0.3 mL IJ SOAJ injection Inject 0.3 mLs (0.3 mg total) into the muscle as needed for anaphylaxis. 10/31/19   Minna Antis, MD  Homeopathic Products (FRANKINCENSE UPLIFTING) OIL Inhale into the lungs.    [provider]  Turmeric (QC TUMERIC COMPLEX PO) Take 1,000 mg by mouth daily at 2 PM.    [provider]    Physical Exam    Vital Signs:  April [name] does not have vital signs available for review today.***  Given telephonic nature of communication, physical exam is limited. AAOx3. NAD. Normal affect.  Speech and respirations are unlabored.  Accessory Clinical Findings    None  Assessment & Plan    Primary Cardiologist: Debbe Odea, MD  Preoperative cardiovascular risk assessment.  Closed reduction nasal fracture by Dr. Willeen Cass.  Chart reviewed as part of pre-operative protocol coverage. According to the RCRI, patient has a 0.9% risk of MACE. Patient reports activity equivalent to 4.0 METS (***).   Given past medical history and time since last visit, based on ACC/AHA guidelines, April [name] would be at  acceptable risk for the planned procedure without further cardiovascular testing.   Patient was advised that if she develops new symptoms prior to surgery to contact our office to arrange a follow-up appointment.  she verbalized understanding.  Aspirin prescribed by a noncardiology provider therefore recommendations for holding deferred to prescribing provider.    I will route this recommendation to the requesting party via Epic fax function.  Please call with questions.  Time:   Today, I have spent *** minutes with the patient with telehealth technology discussing medical history, symptoms, and management plan.     Carlos Levering, NP  11/01/2022, 2:07 PM

## 2022-11-03 ENCOUNTER — Ambulatory Visit: Payer: HMO | Attending: Cardiology | Admitting: Student

## 2022-11-03 DIAGNOSIS — Z01810 Encounter for preprocedural cardiovascular examination: Secondary | ICD-10-CM

## 2022-11-04 ENCOUNTER — Encounter
Admission: RE | Admit: 2022-11-04 | Discharge: 2022-11-04 | Disposition: A | Discharge status: Home / Self Care | Payer: HMO | Source: Ambulatory Visit | Attending: Otolaryngology | Admitting: Otolaryngology

## 2022-11-04 ENCOUNTER — Other Ambulatory Visit: Payer: Self-pay

## 2022-11-04 HISTORY — DX: Unspecified osteoarthritis, unspecified site: M19.90

## 2022-11-04 HISTORY — DX: Syncope and collapse: R55

## 2022-11-04 HISTORY — DX: Personal history of urinary calculi: Z87.442

## 2022-11-04 HISTORY — DX: Pure hypercholesterolemia, unspecified: E78.00

## 2022-11-04 HISTORY — DX: Anemia, unspecified: D64.9

## 2022-11-04 HISTORY — DX: Essential (primary) hypertension: I10

## 2022-11-04 NOTE — Patient Instructions (Signed)
Your procedure is scheduled on: Wednesday 11/05/22 To find out your arrival time, please call [phone] between 1PM - 3PM on:   Tuesday 11/04/22 Report to the Registration Desk on the 1st floor of the Medical Mall. Free Valet parking is available.  If your arrival time is 6:00 am, do not arrive before that time as the [facility] entrance doors do not open until 6:00 am.  REMEMBER: Instructions that are not followed completely may result in serious medical risk, up to and including death; or upon the discretion of your surgeon and anesthesiologist your surgery may need to be rescheduled.  Do not eat food or drink any liquids after midnight the night before surgery.  No gum chewing or hard candies.  One week prior to surgery: Stop Anti-inflammatories (NSAIDS) such as Advil, Aleve, Ibuprofen, Motrin, Naproxen, Naprosyn and Aspirin based products such as Excedrin, Goody's Powder, BC Powder. You may however, continue to take Tylenol if needed for pain up until the day of surgery.  Stop ANY OVER THE COUNTER supplements or vitamins until after surgery.  Continue taking all prescribed medications.   TAKE ONLY THESE MEDICATIONS THE MORNING OF SURGERY WITH A SIP OF WATER:  atenolol (TENORMIN) 25 MG tablet  amoxicillin (AMOXIL) 500 MG tablet   No Alcohol for 24 hours before or after surgery.  No Smoking including e-cigarettes for 24 hours before surgery.  No chewable tobacco products for at least 6 hours before surgery.  No nicotine patches on the day of surgery.  Do not use any "recreational" drugs for at least a week (preferably 2 weeks) before your surgery.  Please be advised that the combination of cocaine and anesthesia may have negative outcomes, up to and including death. If you test positive for cocaine, your surgery will be cancelled.  On the morning of surgery brush your teeth with toothpaste and water, you may rinse your mouth with mouthwash if you wish. Do not swallow any  toothpaste or mouthwash.  Use CHG Soap or wipes as directed on instruction sheet. Shower with your regular soap the morning of surgery.  Do not wear lotions, powders, or perfumes.   Do not shave body hair from the neck down 48 hours before surgery.  Wear comfortable clothing (specific to your surgery type) to the hospital.  Do not wear jewelry, make-up, hairpins, clips or nail polish.  Contact lenses, hearing aids and dentures may not be worn into surgery.  Do not bring valuables to the hospital. [facility] is not responsible for any missing/lost belongings or valuables.   Notify your doctor if there is any change in your medical condition (cold, fever, infection).  If you are being discharged the day of surgery, you will not be allowed to drive home. You will need a responsible individual to drive you home and stay with you for 24 hours after surgery.   If you are taking public transportation, you will need to have a responsible individual with you.  If you are being admitted to the hospital overnight, leave your suitcase in the car. After surgery it may be brought to your room.  In case of increased patient census, it may be necessary for you, the patient, to continue your postoperative care in the Same Day Surgery department.  After surgery, you can help prevent lung complications by doing breathing exercises.  Take deep breaths and cough every 1-2 hours. Your doctor may order a device called an Incentive Spirometer to help you take deep breaths. When coughing  or sneezing, hold a pillow firmly against your incision with both hands. This is called "splinting." Doing this helps protect your incision. It also decreases belly discomfort.  Surgery Visitation Policy:  Patients undergoing a surgery or procedure may have two family members or support persons with them as long as the person is not COVID-19 positive or experiencing its symptoms.   Inpatient Visitation:    Visiting  hours are 7 a.m. to 8 p.m. Up to four visitors are allowed at one time in a patient room. The visitors may rotate out with other people during the day. One designated support person (adult) may remain overnight.  Please call the Pre-admissions Testing Dept. at [phone] if you have any questions about these instructions.

## 2022-11-05 ENCOUNTER — Ambulatory Visit
Admission: RE | Admit: 2022-11-05 | Discharge: 2022-11-05 | Disposition: A | Discharge status: Home / Self Care | Payer: HMO | Attending: Otolaryngology | Admitting: Otolaryngology

## 2022-11-05 ENCOUNTER — Other Ambulatory Visit: Payer: Self-pay

## 2022-11-05 ENCOUNTER — Ambulatory Visit: Payer: HMO | Admitting: Urgent Care

## 2022-11-05 ENCOUNTER — Encounter: Payer: Self-pay | Admitting: Otolaryngology

## 2022-11-05 ENCOUNTER — Encounter
Admission: RE | Disposition: A | Discharge status: Home / Self Care | Payer: Self-pay | Source: Home / Self Care | Attending: Otolaryngology

## 2022-11-05 ENCOUNTER — Ambulatory Visit: Payer: HMO | Admitting: Anesthesiology

## 2022-11-05 DIAGNOSIS — Z955 Presence of coronary angioplasty implant and graft: Secondary | ICD-10-CM | POA: Insufficient documentation

## 2022-11-05 DIAGNOSIS — I10 Essential (primary) hypertension: Secondary | ICD-10-CM | POA: Diagnosis not present

## 2022-11-05 DIAGNOSIS — S022XXA Fracture of nasal bones, initial encounter for closed fracture: Secondary | ICD-10-CM | POA: Insufficient documentation

## 2022-11-05 DIAGNOSIS — Z7982 Long term (current) use of aspirin: Secondary | ICD-10-CM | POA: Diagnosis not present

## 2022-11-05 DIAGNOSIS — W19XXXA Unspecified fall, initial encounter: Secondary | ICD-10-CM | POA: Diagnosis not present

## 2022-11-05 DIAGNOSIS — I2510 Atherosclerotic heart disease of native coronary artery without angina pectoris: Secondary | ICD-10-CM | POA: Diagnosis not present

## 2022-11-05 DIAGNOSIS — I252 Old myocardial infarction: Secondary | ICD-10-CM | POA: Diagnosis not present

## 2022-11-05 DIAGNOSIS — J342 Deviated nasal septum: Secondary | ICD-10-CM | POA: Insufficient documentation

## 2022-11-05 HISTORY — PX: CLOSED REDUCTION NASAL FRACTURE: SHX5365

## 2022-11-05 SURGERY — CLOSED REDUCTION, FRACTURE, NASAL BONE
Anesthesia: General | Site: Nose | Laterality: Bilateral

## 2022-11-05 MED ORDER — LACTATED RINGERS IV SOLN: INTRAVENOUS | Status: DC

## 2022-11-05 MED ORDER — ACETAMINOPHEN 10 MG/ML IV SOLN
INTRAVENOUS | Status: DC | PRN
  Administered 2022-11-05: 1000 mg via INTRAVENOUS

## 2022-11-05 MED ORDER — LIDOCAINE HCL (CARDIAC) PF 100 MG/5ML IV SOSY
PREFILLED_SYRINGE | INTRAVENOUS | Status: DC | PRN
  Administered 2022-11-05: 100 mg via INTRAVENOUS

## 2022-11-05 MED ORDER — 0.9 % SODIUM CHLORIDE (POUR BTL) OPTIME
TOPICAL | Status: DC | PRN
  Administered 2022-11-05: 500 mL

## 2022-11-05 MED ORDER — ONDANSETRON HCL 4 MG/2ML IJ SOLN
INTRAMUSCULAR | Status: DC | PRN
  Administered 2022-11-05: 4 mg via INTRAVENOUS

## 2022-11-05 MED ORDER — LIDOCAINE HCL (PF) 2 % IJ SOLN
INTRAMUSCULAR | Status: AC
  Filled 2022-11-05: qty 5

## 2022-11-05 MED ORDER — OXYMETAZOLINE HCL 0.05 % NA SOLN
NASAL | Status: DC | PRN
  Administered 2022-11-05: 1 via TOPICAL

## 2022-11-05 MED ORDER — CHLORHEXIDINE GLUCONATE 0.12 % MT SOLN
15.0000 mL | Freq: Once | OROMUCOSAL | Status: AC
  Administered 2022-11-05: 15 mL via OROMUCOSAL

## 2022-11-05 MED ORDER — ONDANSETRON HCL 4 MG/2ML IJ SOLN: 4.0000 mg | Freq: Once | INTRAMUSCULAR | Status: DC | PRN

## 2022-11-05 MED ORDER — HYDRALAZINE HCL 20 MG/ML IJ SOLN
INTRAMUSCULAR | Status: AC
  Filled 2022-11-05: qty 1

## 2022-11-05 MED ORDER — FAMOTIDINE 20 MG PO TABS
20.0000 mg | ORAL_TABLET | Freq: Once | ORAL | Status: AC
  Administered 2022-11-05: 20 mg via ORAL

## 2022-11-05 MED ORDER — ACETAMINOPHEN 10 MG/ML IV SOLN: 1000.0000 mg | Freq: Once | INTRAVENOUS | Status: DC | PRN

## 2022-11-05 MED ORDER — PROPOFOL 10 MG/ML IV BOLUS
INTRAVENOUS | Status: DC | PRN
  Administered 2022-11-05: 150 mg via INTRAVENOUS
  Administered 2022-11-05: 20 mg via INTRAVENOUS

## 2022-11-05 MED ORDER — HYDRALAZINE HCL 20 MG/ML IJ SOLN
10.0000 mg | INTRAMUSCULAR | Status: AC
  Administered 2022-11-05: 10 mg via INTRAVENOUS
  Filled 2022-11-05: qty 0.5

## 2022-11-05 MED ORDER — LIDOCAINE HCL 1 % IJ SOLN
INTRAMUSCULAR | Status: DC | PRN
  Administered 2022-11-05: 1 mL

## 2022-11-05 MED ORDER — OXYCODONE HCL 5 MG PO TABS
5.0000 mg | ORAL_TABLET | Freq: Once | ORAL | Status: AC | PRN
  Administered 2022-11-05: 5 mg via ORAL

## 2022-11-05 MED ORDER — DEXAMETHASONE SODIUM PHOSPHATE 10 MG/ML IJ SOLN
INTRAMUSCULAR | Status: DC | PRN
  Administered 2022-11-05: 10 mg via INTRAVENOUS

## 2022-11-05 MED ORDER — CHLORHEXIDINE GLUCONATE 0.12 % MT SOLN
OROMUCOSAL | Status: AC
  Filled 2022-11-05: qty 15

## 2022-11-05 MED ORDER — PROPOFOL 10 MG/ML IV BOLUS
INTRAVENOUS | Status: AC
  Filled 2022-11-05: qty 20

## 2022-11-05 MED ORDER — ORAL CARE MOUTH RINSE: 15.0000 mL | Freq: Once | OROMUCOSAL | Status: AC

## 2022-11-05 MED ORDER — FENTANYL CITRATE (PF) 100 MCG/2ML IJ SOLN
25.0000 ug | INTRAMUSCULAR | Status: DC | PRN
  Administered 2022-11-05: 25 ug via INTRAVENOUS

## 2022-11-05 MED ORDER — DEXAMETHASONE SODIUM PHOSPHATE 10 MG/ML IJ SOLN
INTRAMUSCULAR | Status: AC
  Filled 2022-11-05: qty 1

## 2022-11-05 MED ORDER — OXYCODONE HCL 5 MG PO TABS
ORAL_TABLET | ORAL | Status: AC
  Filled 2022-11-05: qty 1

## 2022-11-05 MED ORDER — FENTANYL CITRATE (PF) 100 MCG/2ML IJ SOLN
INTRAMUSCULAR | Status: DC | PRN
  Administered 2022-11-05 (×2): 25 ug via INTRAVENOUS

## 2022-11-05 MED ORDER — FENTANYL CITRATE (PF) 100 MCG/2ML IJ SOLN
INTRAMUSCULAR | Status: AC
  Filled 2022-11-05: qty 2

## 2022-11-05 MED ORDER — ACETAMINOPHEN 10 MG/ML IV SOLN
INTRAVENOUS | Status: AC
  Filled 2022-11-05: qty 100

## 2022-11-05 MED ORDER — OXYCODONE HCL 5 MG/5ML PO SOLN: 5.0000 mg | Freq: Once | ORAL | Status: AC | PRN

## 2022-11-05 MED ORDER — FAMOTIDINE 20 MG PO TABS
ORAL_TABLET | ORAL | Status: AC
  Filled 2022-11-05: qty 1

## 2022-11-05 MED ORDER — OXYMETAZOLINE HCL 0.05 % NA SOLN
NASAL | Status: AC
  Filled 2022-11-05: qty 30

## 2022-11-05 MED ORDER — ONDANSETRON HCL 4 MG/2ML IJ SOLN
INTRAMUSCULAR | Status: AC
  Filled 2022-11-05: qty 2

## 2022-11-05 MED ORDER — LIDOCAINE-EPINEPHRINE (PF) 1 %-1:200000 IJ SOLN
INTRAMUSCULAR | Status: AC
  Filled 2022-11-05: qty 30

## 2022-11-05 SURGICAL SUPPLY — 20 items
ADH LQ OCL WTPRF AMP STRL LF (MISCELLANEOUS) ×1
ADHESIVE MASTISOL STRL (MISCELLANEOUS) ×1 IMPLANT
AQUAPLAST 3X3 FLAT (MISCELLANEOUS) ×1
CNTNR URN SCR LID CUP LEK RST (MISCELLANEOUS) ×1 IMPLANT
CONT SPEC 4OZ STRL OR WHT (MISCELLANEOUS) ×1
GLOVE BIO SURGEON STRL SZ7.5 (GLOVE) ×1 IMPLANT
GOWN STRL REUS W/ TWL LRG LVL3 (GOWN DISPOSABLE) ×2 IMPLANT
GOWN STRL REUS W/TWL LRG LVL3 (GOWN DISPOSABLE) ×2
MANIFOLD NEPTUNE II (INSTRUMENTS) ×1 IMPLANT
NDL HYPO 27GX1-1/4 (NEEDLE) ×1 IMPLANT
NEEDLE HYPO 27GX1-1/4 (NEEDLE) ×1 IMPLANT
PATTIES SURGICAL .5 X3 (DISPOSABLE) ×1 IMPLANT
SPLINT AQUAPLAST 3X3 FLAT (MISCELLANEOUS) ×1 IMPLANT
STRAP SAFETY 5IN WIDE (MISCELLANEOUS) ×1 IMPLANT
STRIP CLOSURE SKIN 1/2X4 (GAUZE/BANDAGES/DRESSINGS) ×1 IMPLANT
SYR 3ML LL SCALE MARK (SYRINGE) ×1 IMPLANT
TOWEL OR 17X26 4PK STRL BLUE (TOWEL DISPOSABLE) ×2 IMPLANT
TRAP FLUID SMOKE EVACUATOR (MISCELLANEOUS) ×1 IMPLANT
TUBING CONNECTING 10 (TUBING) ×1 IMPLANT
WATER STERILE IRR 500ML POUR (IV SOLUTION) ×1 IMPLANT

## 2022-11-05 NOTE — Anesthesia Procedure Notes (Signed)
Procedure Name: LMA Insertion Date/Time: 11/05/2022 7:39 AM  Performed by: Ginger Carne, CRNAPre-anesthesia Checklist: Patient identified, Emergency Drugs available, Suction available, Patient being monitored and Timeout performed Patient Re-evaluated:Patient Re-evaluated prior to induction Oxygen Delivery Method: Circle system utilized Preoxygenation: Pre-oxygenation with 100% oxygen Induction Type: IV induction LMA: LMA inserted LMA Size: 4.0 Tube type: Oral Number of attempts: 1 Tube secured with: Tape Dental Injury: Teeth and Oropharynx as per pre-operative assessment

## 2022-11-05 NOTE — Op Note (Signed)
11/05/2022 7:52 AM  April [name] [id number]   Pre-Op Dx: NASAL FRACTURE  Post-op Dx: SAME  Procedure: Closed reduction of nasal fracture   Surgeon: Sandi Mealy., MD  Anesthesia: General with LMA  EBL: Minimal   Complications: None   Findings: Nasal dorsum deviated to the left with bilateral depressed nasal bone fracture   Procedure: After the patient was identified in holding and the history and physical and consent was reviewed, the patient was taken to the operating room and placed in a supine position. General endotracheal anesthesia was induced in the normal fashion. The patient was draped with the eyes protected. The nose was decongested with Afrin moistened pledgets. After allowing time for decongestion, these were removed.   A Boies elevator was then placed intranasally, and used to manipulate the nasal bone fragments until the nasal dorsum appeared to be midline with no palpable step-off deformity.  Following this, the skin was prepped with Mastisol, and 1/2 Ster-strips applied to the nose. Next an Aquaplast splint was fashioned to fit the nasal dorsum, and placed for protection of the nasal bones during healing. This was further secured with Steri-strips. The care of patient was returned to anesthesia, awakened, and transferred to recovery in stable condition.   Disposition: PACU to home   Plan: Regular diet. Ice pack to nose as needed for pain and swelling. Keep nasal splint in place until follow-up. Limit exercise and strenuous activity for the next week. Recheck my office once week.   Sandi Mealy., MD  7:52 AM 11/05/2022

## 2022-11-05 NOTE — Anesthesia Preprocedure Evaluation (Addendum)
Anesthesia Evaluation  Patient identified by MRN, date of birth, ID band Patient awake    Reviewed: Allergy & Precautions, NPO status , Patient's Chart, lab work & pertinent test results  History of Anesthesia Complications Negative for: history of anesthetic complications  Airway Mallampati: IV   Neck ROM: Full    Dental no notable dental hx.    Pulmonary neg pulmonary ROS   Pulmonary exam normal breath sounds clear to auscultation       Cardiovascular hypertension, + CAD (s/p MI and stents)  Normal cardiovascular exam Rhythm:Regular Rate:Normal  Hx PE 2016 s/p IVC filter, on ASA only  ECG 10/23/22: NSR; minimal voltage criteria for [facility]; inferior infarct, age undetermined; no STEMI  Echo 06/03/22:    1. The left ventricle is normal in size with mildly increased wall thickness.  2. The left ventricular systolic function is normal, LVEF is visually estimated at 60-65%.  3. The aortic valve is trileaflet with mildly thickened leaflets with normal excursion.  4. There is mild aortic regurgitation.  5. The right ventricle is normal in size, with normal systolic function.     Neuro/Psych Vertigo     GI/Hepatic negative GI ROS,,,  Endo/Other  negative endocrine ROS    Renal/GU Renal disease (nephrolithiasis)     Musculoskeletal  (+) Arthritis ,    Abdominal   Peds  Hematology  (+) Blood dyscrasia, anemia   Anesthesia Other Findings Cardiology note 11/03/22:  1. Preoperative cardiovascular risk assessment.  Closed reduction nasal fracture by Dr. Willeen Cass.   Chart reviewed as part of pre-operative protocol coverage. According to the RCRI, patient has a 0.9% risk of MACE. Patient reports activity equivalent to >4.0 METS (volunteers with [employer], walks daily).    Given past medical history and time since last visit, based on ACC/AHA guidelines, April [name] would be at acceptable risk for the planned  procedure without further cardiovascular testing.    Patient was advised that if she develops new symptoms prior to surgery to contact our office to arrange a follow-up appointment.  she verbalized understanding.   Reproductive/Obstetrics                             Anesthesia Physical Anesthesia Plan  ASA: 3  Anesthesia Plan: General   Post-op Pain Management:    Induction: Intravenous  PONV Risk Score and Plan: 3 and Ondansetron, Dexamethasone and Treatment may vary due to age or medical condition  Airway Management Planned: LMA  Additional Equipment:   Intra-op Plan:   Post-operative Plan: Extubation in OR  Informed Consent: I have reviewed the patients History and Physical, chart, labs and discussed the procedure including the risks, benefits and alternatives for the proposed anesthesia with the patient or authorized representative who has indicated his/her understanding and acceptance.     Dental advisory given  Plan Discussed with: CRNA  Anesthesia Plan Comments: (Patient consented for risks of anesthesia including but not limited to:  - adverse reactions to medications - damage to eyes, teeth, lips or other oral mucosa - nerve damage due to positioning  - sore throat or hoarseness - damage to heart, brain, nerves, lungs, other parts of body or loss of life  Informed patient about role of CRNA in peri- and intra-operative care.  Patient voiced understanding.)        Anesthesia Quick Evaluation

## 2022-11-05 NOTE — H&P (Signed)
History and physical reviewed and will be scanned in later. No change in medical status reported by the patient or family, appears stable for surgery. Cleared by Cardiology. All questions regarding the procedure answered, and patient (or family if a child) expressed understanding of the procedure.  April [name] @TODAY @

## 2022-11-05 NOTE — Transfer of Care (Signed)
Immediate Anesthesia Transfer of Care Note  Patient: April [name]  Procedure(s) Performed: CLOSED REDUCTION NASAL FRACTURE (Bilateral: Nose)  Patient Location: PACU  Anesthesia Type:General  Level of Consciousness: awake, alert , and oriented  Airway & Oxygen Therapy: Patient Spontanous Breathing and Patient connected to face mask oxygen  Post-op Assessment: Report given to RN and Post -op Vital signs reviewed and stable  Post vital signs: Reviewed and stable  Last Vitals:  Vitals Value Taken Time  BP 151/71 11/05/22 0801  Temp 36.3 C 11/05/22 0801  Pulse 55 11/05/22 0805  Resp 13 11/05/22 0805  SpO2 100 % 11/05/22 0805  Vitals shown include unvalidated device data.  Last Pain:  Vitals:   11/05/22 0614  TempSrc: Oral         Complications: No notable events documented.

## 2022-11-05 NOTE — Discharge Instructions (Signed)

## 2022-11-06 NOTE — Anesthesia Postprocedure Evaluation (Signed)
Anesthesia Post Note  Patient: April [name]  Procedure(s) Performed: CLOSED REDUCTION NASAL FRACTURE (Bilateral: Nose)  Patient location during evaluation: PACU Anesthesia Type: General Level of consciousness: awake and alert, oriented and patient cooperative Pain management: pain level controlled Vital Signs Assessment: post-procedure vital signs reviewed and stable Respiratory status: spontaneous breathing, nonlabored ventilation and respiratory function stable Cardiovascular status: blood pressure returned to baseline and stable Postop Assessment: adequate PO intake Anesthetic complications: no   No notable events documented.   Last Vitals:  Vitals:   11/05/22 0916 11/05/22 0926  BP: (!) 213/69 (!) 173/70  Pulse: (!) 58 62  Resp:  16  Temp:  (!) 36.3 C  SpO2:  99%    Last Pain:  Vitals:   11/05/22 0901  TempSrc:   PainSc: 4                  Reed Breech

## 2022-11-07 ENCOUNTER — Telehealth (INDEPENDENT_AMBULATORY_CARE_PROVIDER_SITE_OTHER): Payer: Self-pay

## 2022-11-07 NOTE — Telephone Encounter (Signed)
Spoke with the patient to decide on a date for the IVC filter removal. Patient stated she has a few appointments coming up as well as vacation time as well. Patient stated she will call after her vacation to discuss being scheduled for the procedure.

## 2022-11-18 ENCOUNTER — Telehealth: Payer: Self-pay | Admitting: Cardiology

## 2022-11-18 ENCOUNTER — Telehealth: Payer: Self-pay | Admitting: *Deleted

## 2022-11-18 NOTE — Telephone Encounter (Signed)
Zio by Theodore Demark is calling to give abnormal readings.

## 2022-11-18 NOTE — Telephone Encounter (Signed)
Spoke with patient and reviewed what monitor results revealed and recommendations from provider. She is currently in [facility] and states that they are getting back on the 25 th. She reports on the 26 th she has dental appt and could do something later that day. Scheduled her to come in on 11/21/22 at 1:30 pm to be seen by Levy Sjogren NP. Instructed her to not drive, call our office if she should have any further syncope, and to STOP atenolol. She verbalized understanding of our conversation with no further questions at this time.

## 2022-11-18 NOTE — Telephone Encounter (Signed)
I reviewed zio report.  Patient had 3 pauses, ranging from 4.1-13.1 seconds. Did she have any syncope while wearing the monitor?  I would like for her to stop her atenolol, no driving. Please schedule with me for further eval.

## 2022-11-18 NOTE — Telephone Encounter (Signed)
Left voicemail message to call back.   Orders received by Dr. Mariah Milling: STOP Atenolol

## 2022-11-18 NOTE — Telephone Encounter (Signed)
Received another report from zio that patient had a 24.6 second pause- back to back. On 11/02/2022 at 3:10 AM. Patient has been spoken too today already- plan in place, scheduled with NP on 07/26. She is aware.   Only routing to you since you are aware of the situation.   Thanks!

## 2022-11-18 NOTE — Telephone Encounter (Signed)
Patient was returning call. Please advise ?

## 2022-11-19 NOTE — Telephone Encounter (Signed)
Called and spoke with patient. Patient states that had no syncopal episodes while wearing the heart monitor.

## 2022-11-20 NOTE — Progress Notes (Signed)
Cardiology Office Note Date:  11/20/2022  Patient ID:  April [name], April [name] 1945/09/30, MRN [id number] PCP:  Marina Goodell, [name]  Cardiologist:  Debbe Odea, [name] (has seen inpatient only) Electrophysiologist: None    Chief Complaint: sinus pauses   History of Present Illness: April [name] is a 77 y.o. 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   The patient was eval'd in the hospital by cardiology team 09/2022 after syncopizing. Prior to syncope, she had prodrome of nausea - this was second syncope event. Workup at that time was non-revealing so an ambulatory monitor was ordered at discharge.  Cardiology clinic was alerted by zio for a 13s pause. Patient was contacted and was sleeping during both episodes. Her atenolol was stopped.  On follow-up today, patient is feeling well. She denies any further episodes of syncope, presyncope, or dizziness. She has stopped her atenolol, was on BB since her heart attack in 2006.  She denies s/s of sleep apnea, no snoring, PND, no morning HA or daytime somnolence. She checks BP regularly at home, most systolic readings 140-150s. She has some lower extremity edema, R>L ever since fall 1 month ago.  she denies chest pain, palpitations, dyspnea, PND, orthopnea, nausea, vomiting, dizziness, syncope,  weight gain, or early satiety.     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  Gerarda Fraction, [name]   Pulmonary  embolism (HCC) 2016   Pulmonary embolism (HCC) 2016   Syncope    Vertigo 1990   "inner ear"    Past Surgical History:  Procedure Laterality Date   ABDOMINAL HYSTERECTOMY     APPENDECTOMY     BREAST BIOPSY Right 04/30/2020   11:00 5cmfn Qmarker-benign, 11:00 8 cmfn twisted X marker-benign   CARDIAC CATHETERIZATION  2005   CATARACT EXTRACTION W/PHACO Left 02/08/2019   Procedure: CATARACT EXTRACTION PHACO AND INTRAOCULAR LENS PLACEMENT (IOC) LEFT 00:40.0  20.6%  8.26;  Surgeon: April [name];  Location: [facility];  Service: Ophthalmology;  Laterality: Left;   CATARACT EXTRACTION W/PHACO Right 04/26/2019   Procedure: CATARACT EXTRACTION PHACO AND INTRAOCULAR LENS PLACEMENT (IOC) RIGHT 6.83 00:46.0;  Surgeon: April [name];  Location: [facility];  Service: Ophthalmology;  Laterality: Right;   CHOLECYSTECTOMY     CLOSED REDUCTION NASAL FRACTURE Bilateral 11/05/2022   Procedure: CLOSED REDUCTION NASAL FRACTURE;  Surgeon: April [name];  Location: [facility];  Service: ENT;  Laterality: Bilateral;   COLONOSCOPY  2017   CORONARY ANGIOPLASTY WITH STENT PLACEMENT     FOOT SURGERY Right    IVC FILTER INSERTION N/A 07/08/2022   Procedure: IVC FILTER INSERTION;  Surgeon: April [name];  Location: [facility];  Service: Cardiovascular;  Laterality: N/A;   IVC FILTER REMOVAL N/A 09/23/2022   Procedure: IVC FILTER REMOVAL;  Surgeon: April [name];  Location: [facility];  Service: Cardiovascular;  Laterality: N/A;   TOTAL KNEE ARTHROPLASTY Right 07/21/2022   Procedure: TOTAL KNEE ARTHROPLASTY;  Surgeon: April [name];  Location: [facility];  Service: Orthopedics;  Laterality: Right;    Current Outpatient Medications  Medication Instructions   acetaminophen (TYLENOL) 1,000 mg, Oral, Every 8 hours   amoxicillin (AMOXIL) 500 mg, Oral, 3 times daily   aspirin EC 81 mg, Oral   atorvastatin (LIPITOR) 40 mg, Oral, Nightly    calcium citrate-vitamin D (CITRACAL+D) 315-200 MG-UNIT tablet 1 tablet, Oral, 2 times daily   Cholecalciferol (VITAMIN D3) 2000 UNITS capsule 1 capsule, Oral, Daily   EPINEPHrine (EPIPEN 2-PAK) 0.3 mg, Intramuscular, As needed   Homeopathic Products (FRANKINCENSE UPLIFTING) OIL Inhalation   Turmeric (QC TUMERIC COMPLEX PO) 1,000 mg, Oral, Daily    Social History:  The patient  reports that she has never smoked. She has never used smokeless tobacco. She reports that she does not drink alcohol and does not use drugs.   Family History:  The patient's family history includes CAD in her father; Colon cancer in an other family member; Stroke in her mother.  ROS:  Please see the history of present illness. All other systems are reviewed and otherwise negative.   PHYSICAL EXAM:  VS:  BP (!) 140/70 (BP Location: Left Arm, Patient Position: Sitting, Cuff Size: Normal)   Pulse 64   Ht 5\' 7"  (1.702 m)   Wt 191 lb 3.2 oz (86.7 kg)   SpO2 98%   BMI 29.95 kg/m  BMI: There is no height or weight on file to calculate BMI.  GEN- The patient is well appearing, alert and oriented x 3 today.   Lungs- Clear to ausculation bilaterally, normal work of breathing.  Heart- Regular rate and rhythm, no murmurs, rubs or gallops Extremities- 1+ peripheral edema, warm, dry   EKG is ordered. Personal review of EKG from today shows:    EKG Interpretation Date/Time:  Friday November 21 2022 13:23:20 EDT Ventricular Rate:  64 PR Interval:  156 QRS Duration:  76 QT Interval:  430 QTC Calculation: 443 R Axis:   -13  Text Interpretation: Normal sinus rhythm Possible Left atrial enlargement Minimal voltage criteria for LVH, may be normal variant ( R in aVL ) Inferior infarct (cited on or before 22-Oct-2022) When compared with ECG of 22-Oct-2022 09:46, No significant change was found Confirmed by Sherie Don [phone]) on 11/21/2022 1:38:41 PM    Recent Labs: 10/22/2022: Magnesium 2.0; TSH 1.624 10/23/2022: ALT  15 10/24/2022: BUN 17; Creatinine, Ser 0.85; Hemoglobin 10.4; Platelets 206; Potassium 3.6; Sodium 139  No results found for requested labs within last 365 days.   CrCl cannot be calculated (Patient's most recent lab result is older than the maximum 21 days allowed.).   Wt Readings from Last 3 Encounters:  11/05/22 185 lb 15.7 oz (84.4 kg)  11/04/22 186 lb (84.4 kg)  10/23/22 196 lb 6.9 oz (89.1 kg)     Additional studies reviewed include: Previous EP, cardiology notes.   Long term monitor, unofficial read; 11/18/2022 Patient had a min HR of 37 bpm, max HR of 193 bpm, and avg HR of 62 bpm. Predominant underlying rhythm was Sinus Rhythm. 37 Supraventricular Tachycardia runs occurred, the run with the fastest interval lasting 7 beats with a max rate of 193 bpm, the longest lasting 10.9 secs with an avg rate of 120 bpm. 3 Pauses occurred, the longest lasting 13.1 secs (5 bpm). Isolated SVEs were  rare (<1.0%), SVE Couplets were rare (<1.0%), and SVE Triplets were rare (<1.0%). Isolated VEs were rare (<1.0%), and no VE Couplets or VE Triplets were present. Ventricular Bigeminy was present. [name] notification criteria for Pauses met - report posted prior to notification per account request (LS).  TTE, 10/24/2022  1. Left ventricular ejection fraction, by estimation, is 65 to 70%. The left ventricle has normal function. The left ventricle has no regional wall motion abnormalities. Left ventricular diastolic parameters were normal. The average left ventricular global longitudinal strain is -18.4 %. The global longitudinal strain is normal.   2. Right ventricular systolic function is normal. The right ventricular size is normal. Tricuspid regurgitation signal is inadequate for assessing PA pressure.   3. The mitral valve is normal in structure. Trivial mitral valve regurgitation. No evidence of mitral stenosis.   4. The aortic valve is tricuspid. Aortic valve regurgitation is mild. No aortic stenosis is  present.   5. Pulmonic valve regurgitation is moderate.   ASSESSMENT AND PLAN:  #) nocturnal bradycardia #) concern for OSA Significant pauses overnight, query sleep apnea even though denies traditional OSA symptoms Off BB currently, avoid all AVN medications Lab sleep study for further eval  #) syncope No further episodes of syncope  #) HTN Avoid AVN blocking medications d/t nocturnal pauses as above Will start 25mg  losartan Update BMP in 1-2 weeks    Current medicines are reviewed at length with the patient today.   The patient does not have concerns regarding her medicines.  The following changes were made today:   START 25mg  losartan  Labs/ tests ordered today include:  Orders Placed This Encounter  Procedures   Basic Metabolic Panel (BMET)   EKG 12-Lead   Split night study     Disposition: Follow up with EP APP or EP APP in 4 weeks   Signed, Sherie Don, NP  11/20/22  2:17 PM  Electrophysiology [facility]

## 2022-11-21 ENCOUNTER — Ambulatory Visit: Payer: HMO | Admitting: Cardiology

## 2022-11-21 ENCOUNTER — Encounter: Payer: Self-pay | Admitting: Cardiology

## 2022-11-21 VITALS — BP 140/70 | HR 64 | Ht 67.0 in | Wt 191.2 lb

## 2022-11-21 DIAGNOSIS — G4730 Sleep apnea, unspecified: Secondary | ICD-10-CM | POA: Diagnosis not present

## 2022-11-21 DIAGNOSIS — R55 Syncope and collapse: Secondary | ICD-10-CM

## 2022-11-21 DIAGNOSIS — I25119 Atherosclerotic heart disease of native coronary artery with unspecified angina pectoris: Secondary | ICD-10-CM | POA: Diagnosis not present

## 2022-11-21 MED ORDER — LOSARTAN POTASSIUM 25 MG PO TABS: 25.0000 mg | ORAL_TABLET | Freq: Every day | ORAL | 1 refills | Status: DC

## 2022-11-21 NOTE — Patient Instructions (Addendum)
Medication Instructions:  START losartan 25 mg by mouth daily  *If you need a refill on your cardiac medications before your next appointment, please call your pharmacy*   Lab Work: Your provider would like for you to return in 1-2 weeks to have the following labs drawn: BMP.   Please go to [facility] [address] ([facility] [address], [place] [address] You do not need an appointment.  They are open from 7:30 am-4 pm.  You do not need to be fasting.  You may also go to any of these [facility] locations:  [facility]  - [address] - [address] ([facility])  [facility] - [address] ([facility]) - [address] [phone] [address]  If you have labs (blood work) drawn today and your tests are completely normal, you will receive your results only by: [employer] (if you have MyChart) OR A paper copy in the mail If you have any lab test that is abnormal or we need to change your treatment, we will call you to review the results.   Testing/Procedures: Your physician has recommended that you have a sleep study. This test records several body functions during sleep, including: brain activity, eye movement, oxygen and carbon dioxide blood levels, heart rate and rhythm, breathing rate and rhythm, the flow of air through your mouth and nose, snoring, body muscle movements, and chest and belly movement.  Follow-Up: At [facility], you and your health needs are our priority.  As part of our continuing mission to provide you with exceptional heart care, we have created designated Provider Care Teams.  These Care Teams include your primary Cardiologist (physician) and Advanced Practice Providers (APPs -  Physician Assistants and Nurse Practitioners) who all work together to provide you with the care you need, when you need it.  We recommend signing up for the patient portal called  "MyChart".  Sign up information is provided on this After Visit Summary.  MyChart is used to connect with patients for Virtual Visits (Telemedicine).  Patients are able to view lab/test results, encounter notes, upcoming appointments, etc.  Non-urgent messages can be sent to your provider as well.   To learn more about what you can do with MyChart, go to [email or web address].    Your next appointment:   1 month   Provider:   Sherie Don, NP

## 2022-12-02 ENCOUNTER — Other Ambulatory Visit: Payer: Self-pay

## 2022-12-02 DIAGNOSIS — I25119 Atherosclerotic heart disease of native coronary artery with unspecified angina pectoris: Secondary | ICD-10-CM

## 2022-12-02 DIAGNOSIS — R55 Syncope and collapse: Secondary | ICD-10-CM

## 2022-12-03 ENCOUNTER — Emergency Department: Payer: HMO

## 2022-12-03 ENCOUNTER — Telehealth: Payer: Self-pay

## 2022-12-03 ENCOUNTER — Other Ambulatory Visit: Payer: Self-pay

## 2022-12-03 ENCOUNTER — Observation Stay
Admission: EM | Admit: 2022-12-03 | Discharge: 2022-12-04 | Disposition: A | Discharge status: Home / Self Care | Payer: HMO | Attending: Internal Medicine | Admitting: Internal Medicine

## 2022-12-03 DIAGNOSIS — Z96651 Presence of right artificial knee joint: Secondary | ICD-10-CM | POA: Insufficient documentation

## 2022-12-03 DIAGNOSIS — I10 Essential (primary) hypertension: Secondary | ICD-10-CM | POA: Diagnosis not present

## 2022-12-03 DIAGNOSIS — Z86718 Personal history of other venous thrombosis and embolism: Secondary | ICD-10-CM | POA: Diagnosis not present

## 2022-12-03 DIAGNOSIS — R404 Transient alteration of awareness: Secondary | ICD-10-CM | POA: Diagnosis not present

## 2022-12-03 DIAGNOSIS — Z7982 Long term (current) use of aspirin: Secondary | ICD-10-CM | POA: Insufficient documentation

## 2022-12-03 DIAGNOSIS — G4730 Sleep apnea, unspecified: Secondary | ICD-10-CM

## 2022-12-03 DIAGNOSIS — Z79899 Other long term (current) drug therapy: Secondary | ICD-10-CM | POA: Diagnosis not present

## 2022-12-03 DIAGNOSIS — R55 Syncope and collapse: Secondary | ICD-10-CM

## 2022-12-03 DIAGNOSIS — R001 Bradycardia, unspecified: Principal | ICD-10-CM | POA: Diagnosis present

## 2022-12-03 DIAGNOSIS — I2510 Atherosclerotic heart disease of native coronary artery without angina pectoris: Secondary | ICD-10-CM | POA: Insufficient documentation

## 2022-12-03 DIAGNOSIS — Z23 Encounter for immunization: Secondary | ICD-10-CM | POA: Diagnosis not present

## 2022-12-03 LAB — TSH: TSH: 1.581 u[IU]/mL (ref 0.350–4.500)

## 2022-12-03 MED ORDER — ACETAMINOPHEN 500 MG PO TABS: 1000.0000 mg | ORAL_TABLET | Freq: Three times a day (TID) | ORAL | Status: DC | PRN

## 2022-12-03 MED ORDER — LOSARTAN POTASSIUM 50 MG PO TABS
25.0000 mg | ORAL_TABLET | Freq: Every day | ORAL | Status: DC
  Filled 2022-12-03: qty 1

## 2022-12-03 MED ORDER — ASPIRIN 81 MG PO TBEC
81.0000 mg | DELAYED_RELEASE_TABLET | Freq: Every day | ORAL | Status: DC
  Administered 2022-12-04: 81 mg via ORAL
  Filled 2022-12-03: qty 1

## 2022-12-03 MED ORDER — SODIUM CHLORIDE 0.9% FLUSH
3.0000 mL | Freq: Two times a day (BID) | INTRAVENOUS | Status: DC
  Administered 2022-12-03 – 2022-12-04 (×3): 3 mL via INTRAVENOUS

## 2022-12-03 MED ORDER — ENOXAPARIN SODIUM 40 MG/0.4ML IJ SOSY
40.0000 mg | PREFILLED_SYRINGE | INTRAMUSCULAR | Status: DC
  Administered 2022-12-03: 40 mg via SUBCUTANEOUS
  Filled 2022-12-03: qty 0.4

## 2022-12-03 MED ORDER — LOSARTAN POTASSIUM 50 MG PO TABS: 25.0000 mg | ORAL_TABLET | Freq: Every day | ORAL | Status: DC

## 2022-12-03 MED ORDER — PNEUMOCOCCAL 20-VAL CONJ VACC 0.5 ML IM SUSY
0.5000 mL | PREFILLED_SYRINGE | INTRAMUSCULAR | Status: AC
  Administered 2022-12-04: 0.5 mL via INTRAMUSCULAR
  Filled 2022-12-03 (×2): qty 0.5

## 2022-12-03 MED ORDER — LOSARTAN POTASSIUM 25 MG PO TABS: 50.0000 mg | ORAL_TABLET | Freq: Every day | ORAL | Status: DC

## 2022-12-03 MED ORDER — ATORVASTATIN CALCIUM 20 MG PO TABS
40.0000 mg | ORAL_TABLET | Freq: Every day | ORAL | Status: DC
  Administered 2022-12-04: 40 mg via ORAL
  Filled 2022-12-03: qty 2

## 2022-12-03 NOTE — ED Provider Notes (Signed)
[facility] Provider Note    Event Date/Time   First MD Initiated Contact with Patient 12/03/22 1353     (approximate)   History   Loss of Consciousness   HPI  April [name] is a 77 y.o. female   history remote history of PE previous IVC filter, coronary disease and syncope  Most recently has been evaluated due to syncopal episodes with sudden feeling of nausea preceding and then on heart monitoring noted to have heart pauses.  Patient reports that she is not aware but woke up last night when her partner noted 2 episodes during the night where she would start breathing irregularly he tried to wake her and she would be unresponsive for about 10 to 20 seconds and then would wake up and be normal thereafter.  She reports similar symptoms where she was told her heart was pausing and had changed from atenolol to a new blood pressure medication in hopes that that was the cause.  She is followed by electrophysiology  At the present time she has no symptoms      Physical Exam   Triage Vital Signs: ED Triage Vitals  Encounter Vitals Group     BP 12/03/22 1343 (!) 172/91     Systolic BP Percentile --      Diastolic BP Percentile --      Pulse Rate 12/03/22 1343 67     Resp 12/03/22 1343 18     Temp 12/03/22 1343 98.2 F (36.8 C)     Temp Source 12/03/22 1343 Oral     SpO2 12/03/22 1343 99 %     Weight 12/03/22 1341 183 lb (83 kg)     Height 12/03/22 1341 5\' 7"  (1.702 m)     Head Circumference --      Peak Flow --      Pain Score 12/03/22 1341 0     Pain Loc --      Pain Education --      Exclude from Growth Chart --     Most recent vital signs: Vitals:   12/03/22 1343  BP: (!) 172/91  Pulse: 67  Resp: 18  Temp: 98.2 F (36.8 C)  SpO2: 99%     General: Awake, no distress.  CV:  Good peripheral perfusion.  Normal tones and rate Resp:  Normal effort.  Clear lungs bilaterally Abd:  No distention.  Other:  No lower extremity  edema   ED Results / Procedures / Treatments   Labs (all labs ordered are listed, but only abnormal results are displayed)  Labs reviewed that were drawn yesterday notable for mildly elevated BUN.  Additionally mild anemia with hemoglobin 11.6  EKG  And interpreted by me at 1345 heart rate 70 QRS 80 QTc 430 Normal sinus rhythm, no evidence of acute ischemia.    RADIOLOGY    PROCEDURES:  Critical Care performed: No  Procedures   MEDICATIONS ORDERED IN ED: Medications  acetaminophen (TYLENOL) tablet 1,000 mg (has no administration in time range)  aspirin EC tablet 81 mg (has no administration in time range)  atorvastatin (LIPITOR) tablet 40 mg (has no administration in time range)  losartan (COZAAR) tablet 25 mg (has no administration in time range)  sodium chloride flush (NS) 0.9 % injection 3 mL (has no administration in time range)  enoxaparin (LOVENOX) injection 40 mg (has no administration in time range)     IMPRESSION / MDM / ASSESSMENT AND PLAN / ED COURSE  I  reviewed the triage vital signs and the nursing notes.                              Differential diagnosis includes, but is not limited to, cardiogenic syncope, sinus pauses, vasovagal episode, orthostatic, medication effect etc.  Based on the patient's history and review of her cardiology notes from electrophysiology it seems quite likely the patient may be having ongoing episodes of significant sinus pauses occurring mostly nocturnally.  At this juncture, it is my recommendation that the patient receive formal cardiology consultation here at the hospital and be admitted for telemetry monitoring.  Patient is very much agreeable and understanding this plan of care  Patient's presentation is most consistent with acute complicated illness / injury requiring diagnostic workup.   The patient is on the cardiac monitor to evaluate for evidence of arrhythmia and/or significant heart rate changes.     ----------------------------------------- 2:03 PM on 12/03/2022 ----------------------------------------- Consult placed with Dr. Myriam Forehand and notified S. Riddle (EP)  Accepted to hosptaist, Dr. Chipper Herb  FINAL CLINICAL IMPRESSION(S) / ED DIAGNOSES   Final diagnoses:  Syncope and collapse     Rx / DC Orders   ED Discharge Orders     None        Note:  This document was prepared using Dragon voice recognition software and may include unintentional dictation errors.   Sharyn Creamer, MD 12/03/22 1447

## 2022-12-03 NOTE — ED Notes (Signed)
MD stated he did not want orthostatic vital signs at this time.

## 2022-12-03 NOTE — Telephone Encounter (Signed)
-----   Message from [place] sent at 12/03/2022  7:46 AM EDT ----- Labs stable after starting losartan.   How are her home BP readings?

## 2022-12-03 NOTE — ED Triage Notes (Signed)
Pt sts that her HR at night goes so low that I wake up deathly sick and than I pass out. Pt sts that her cardiologist put her on losartan and it has helped but when I called them this AM they advised me to come to the ED.

## 2022-12-03 NOTE — Consult Note (Signed)
Cardiology Consultation   Patient ID: April [name] MRN: [id number]; DOB: [birth year]  Admit date: 12/03/2022 Date of Consult: 12/03/2022  PCP:  Marina Goodell, MD   [facility] Providers Cardiologist:  Debbe Odea, MD   {   Patient Profile:   April [name] is a 77 y.o. female with a hx of CAD status post DES to the left circumflex in 2006 for non-STEMI, hypertension, hyperlipidemia, prior provoked PE, status post IVC filter with unsuccessful removal, prothrombin gene mutation who is being seen 12/03/2022 for the evaluation of syncope at the request of Dr. Fanny Bien.  History of Present Illness:   April [name] follows with [facility] cardiology.  She has a history of PCI to left circumflex in 2006 for non-STEMI.  Symptoms at that time, including mild arm pain associated with stress (husband was having an MI himself at that time).  Left heart cath showed 20% P RCA, 40% ostial D1 disease.  In 2012 she had recurrent symptoms and underwent exercise SPECT MPI was normal.  Patient has a history of provoked PE related to severe illness and dehydration in 2017 and was previously on Eliquis.  Echo in February 2024 for shortness of breath showed normal LVEF 60 to 65%, mild AI.  Patient saw vascular surgery in March 2024 for preoperative evaluation for orthopedic surgery.  It was felt the IVC filter was strongly indicated prior to high risk orthopedic surgery, especially given history of PE/DVT.  Patient underwent total right knee arthroplasty on July 21, 2022.  Attempts at removal of IVC in May 2024 were unsuccessful.  Patient was admitted in June 2024 with syncope.  Patient fell from a standing position with prodrome of nausea.  Patient was hypertensive on arrival.  Chest CTA showed no PE.  Echo showed LVEF 65 to 70%, normal wall motion and diastolic parameters.  Overall workup was unremarkable.  Heart monitor after discharge showed normal sinus rhythm, 37 runs of SVT, longest lasting  10.9 seconds, 3 pauses that occurred overnight, longest lasting 13.1 seconds.  Beta-blocker was held.  Patient was referred to EP who recommended a sleep study.  Patient was started on losartan.  The patient presented to the ER 12/03/22 for syncope.  Patient reports overnight episodes of syncope. Most the time patient is asleep prior to the episode. She feels nauseous and then passes out.  She was out for 10 to 20 seconds.  After waking up, she felt back to normal.  She reported 2 episodes of syncope last night. She denies chest pain, SOB, LLE, fever, chills, palpitations, orthopnea or pnd. IT doesn't happen every night.  She has not been called to set up sleep study.   In the ER blood pressures elevated 172/91, pulse rate 67, respiratory rate 18, 99% O2, afebrile.  EKG shows normal sinus rhythm with no changes.  ER Labs pending.  Labs from yesterday 8/6 are fairly unremarkable.   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  April Fraction, MD   Pulmonary embolism [facility]) 2016   Pulmonary embolism (HCC) 2016   Syncope    Vertigo 1990   "inner ear"    Past Surgical History:  Procedure Laterality Date   ABDOMINAL HYSTERECTOMY     APPENDECTOMY     BREAST BIOPSY Right 04/30/2020   11:00 5cmfn Qmarker-benign, 11:00 8 cmfn twisted X marker-benign   CARDIAC CATHETERIZATION  2005   CATARACT EXTRACTION W/PHACO Left 02/08/2019   Procedure: CATARACT EXTRACTION PHACO AND INTRAOCULAR LENS PLACEMENT (IOC) LEFT 00:40.0  20.6%  8.26;  Surgeon: Galen Manila, MD;  Location: [facility];  Service: Ophthalmology;   Laterality: Left;   CATARACT EXTRACTION W/PHACO Right 04/26/2019   Procedure: CATARACT EXTRACTION PHACO AND INTRAOCULAR LENS PLACEMENT (IOC) RIGHT 6.83 00:46.0;  Surgeon: Galen Manila, MD;  Location: [facility];  Service: Ophthalmology;  Laterality: Right;   CHOLECYSTECTOMY     CLOSED REDUCTION NASAL FRACTURE Bilateral 11/05/2022   Procedure: CLOSED REDUCTION NASAL FRACTURE;  Surgeon: Geanie Logan, MD;  Location: [facility] ORS;  Service: ENT;  Laterality: Bilateral;   COLONOSCOPY  2017   CORONARY ANGIOPLASTY WITH STENT PLACEMENT     FOOT SURGERY Right    IVC FILTER INSERTION N/A 07/08/2022   Procedure: IVC FILTER INSERTION;  Surgeon: Renford Dills, MD;  Location: [facility] INVASIVE CV LAB;  Service: Cardiovascular;  Laterality: N/A;   IVC FILTER REMOVAL N/A 09/23/2022   Procedure: IVC FILTER REMOVAL;  Surgeon: Renford Dills, MD;  Location: [facility] INVASIVE CV LAB;  Service: Cardiovascular;  Laterality: N/A;   TOTAL KNEE ARTHROPLASTY Right 07/21/2022   Procedure: TOTAL KNEE ARTHROPLASTY;  Surgeon: Reinaldo Berber, MD;  Location: [facility] ORS;  Service: Orthopedics;  Laterality: Right;       Inpatient Medications: Scheduled Meds:  Continuous Infusions:  PRN Meds:   Allergies:    Allergies  Allergen Reactions   Antihistamines, Chlorpheniramine-Type Other (See Comments)    "think crazy thoughts"   Ciprofloxacin Other (See Comments)    Patient cannot remember reaction   Dristan Cold [Chlorphen-Pe-Acetaminophen] Other (See Comments)    "think crazy thoughts"   Ginger Diarrhea   Nitrofurantoin Nausea Only   Celecoxib Rash    Social History:   Social History   Socioeconomic History   Marital status: Widowed    Spouse name: Not on file   Number of children: Not on file   Years of education: Not on file   Highest education level: Not on file  Occupational History   Not on file  Tobacco Use   Smoking status: Never   Smokeless tobacco: Never  Vaping Use   Vaping  status: Never Used  Substance and Sexual Activity   Alcohol use: No   Drug use: No   Sexual activity: Yes    Birth control/protection: None  Other Topics Concern   Not on file  Social History Narrative   Not on file   Social Determinants of Health   Financial Resource Strain: Low Risk  (11/26/2022)   [facility]   Overall Financial Resource Strain (CARDIA)    Difficulty of Paying Living Expenses: Not hard at all  Food Insecurity: No Food Insecurity (11/26/2022)   Received from [facility]   Hunger Vital Sign    Worried About Running Out of Food in the Last Year: Never true    Ran Out of Food in the Last Year: Never true  Transportation Needs: No Transportation Needs (11/26/2022)   [facility]   PRAPARE - Transportation    In the past 12 months, has lack of transportation kept you from medical appointments or from getting medications?: No    Lack of Transportation (Non-Medical): No  Physical Activity: Not on file  Stress: Not on file  Social Connections: Not on file  Intimate Partner Violence: Not At Risk (10/23/2022)   Humiliation, Afraid, Rape, and Kick questionnaire    Fear of Current or Ex-Partner: No    Emotionally Abused: No    Physically Abused: No    Sexually Abused: No    Family History:    Family History  Problem Relation Age of Onset   Stroke Mother    CAD Father    Colon cancer Other        paternal great grandfather   Breast cancer Neg Hx      ROS:  Please see the history of present illness.   All other ROS reviewed and negative.     Physical Exam/Data:   Vitals:   12/03/22 1341 12/03/22 1343  BP:  (!) 172/91  Pulse:  67  Resp:  18  Temp:  98.2 F (36.8 C)  TempSrc:  Oral  SpO2:  99%  Weight: 83 kg   Height: 5\' 7"  (1.702 m)    No intake or output data in the 24 hours ending 12/03/22 1428    12/03/2022    1:41 PM 11/21/2022    1:18 PM 11/05/2022    6:14 AM   Last 3 Weights  Weight (lbs) 183 lb 191 lb 3.2 oz 185 lb 15.7 oz  Weight (kg) 83.008 kg 86.728 kg 84.36 kg     Body mass index is 28.66 kg/m.  General:  Well nourished, well developed, in no acute distress HEENT: normal Neck: no JVD Vascular: No carotid bruits; Distal pulses 2+ bilaterally Cardiac:  normal S1, S2; RRR; no murmur  Lungs:  clear to auscultation bilaterally, no wheezing, rhonchi or rales  Abd: soft, nontender, no hepatomegaly  Ext: no edema Musculoskeletal:  No deformities, BUE and BLE strength normal and equal Skin: warm and dry  Neuro:  CNs 2-12 intact, no focal abnormalities noted Psych:  Normal affect   EKG:  The EKG was personally reviewed and demonstrates: Normal sinus rhythm, 60 bpm, nonspecific T wave changes Telemetry:  Telemetry was personally reviewed and demonstrates:  Nsr HR 60s  Relevant CV Studies:  Heart monitor 10/2022 Event monitor Patch Wear Time:  13 days and 13 hours (2024-07-02T18:56:46-0400 to 2024-07-16T08:54:31-0400)   Normal sinus rhythm Patient had a min HR of 37 bpm, max HR of 193 bpm, and avg HR of 62 bpm.    37 Supraventricular Tachycardia runs occurred, the run with the fastest interval lasting 7 beats with a max rate of 193 bpm, the longest lasting 10.9 secs with an avg rate of 120 bpm.    3 Pauses occurred, the longest lasting 13.1 secs (5 bpm).  Noted at 3 AM   Isolated SVEs were rare (<1.0%), SVE Couplets were rare (<1.0%), and SVE Triplets were rare (<1.0%).  Isolated VEs were rare (<1.0%), and no VE Couplets or VE Triplets were present. Ventricular Bigeminy was present.    Patient triggered events associated with normal sinus rhythm   Signed, Dossie Arbour, MD, Ph.D [facility]      Echo 09/2022 1. Left ventricular ejection [name], by estimation, is 65 to 70%. The  left ventricle has normal function. The left ventricle has no regional  wall motion  abnormalities. Left ventricular diastolic parameters were  normal.  The average left ventricular  global longitudinal strain is -18.4 %. The global longitudinal strain is  normal.   2. Right ventricular systolic function is normal. The right ventricular  size is normal. Tricuspid regurgitation signal is inadequate for assessing  PA pressure.   3. The mitral valve is normal in structure. Trivial mitral valve  regurgitation. No evidence of mitral stenosis.   4. The aortic valve is tricuspid. Aortic valve regurgitation is mild. No  aortic stenosis is present.   5. Pulmonic valve regurgitation is moderate.    Laboratory Data:  High Sensitivity Troponin:  No results for input(s): "TROPONINIHS" in the last 720 hours.   Chemistry Recent Labs  Lab 12/02/22 0945  NA 143  K 4.5  CL 105  CO2 20  GLUCOSE 114*  BUN 29*  CREATININE 0.95  CALCIUM 9.7    No results for input(s): "PROT", "ALBUMIN", "AST", "ALT", "ALKPHOS", "BILITOT" in the last 168 hours. Lipids No results for input(s): "CHOL", "TRIG", "HDL", "LABVLDL", "LDLCALC", "CHOLHDL" in the last 168 hours.  HematologyNo results for input(s): "WBC", "RBC", "HGB", "HCT", "MCV", "MCH", "MCHC", "RDW", "PLT" in the last 168 hours. Thyroid No results for input(s): "TSH", "FREET4" in the last 168 hours.  BNPNo results for input(s): "BNP", "PROBNP" in the last 168 hours.  DDimer No results for input(s): "DDIMER" in the last 168 hours.   Radiology/Studies:  No results found.   Assessment and Plan:   Syncope Nocturnal bradycardia/pauses -Patient has known nocturnal bradycardia with pauses seen on heart monitor in July 2024, which was ordered for syncope.  Atenolol was discontinued.  She was recommended for sleep study, however this has not been performed yet.   -Patient presented with recurrent nocturnal syncope described as nausea followed by passing out for 10 to 20 seconds -Recent echo showed LVEF 65 to 70%, normal wall motion and normal diastolic function. -Recent carotid ultrasound showed mild  disease bilaterally, less than 50%. -EKG and telemetry so far unremarkable -Labs pending -Plan to admit overnight for telemetry monitoring - She ultimately needs a sleep study  H/o PE with prothrombin gene mutation -Provoked PE in 2016 previously on Eliquis -Status post IVC filter for orthopedic surgery with unsuccessful removal in May 2024  CAD status post PCI/DES left circumflex in 2006 -No chest pain reported -Continue aspirin and Lipitor -No further ischemic workup indicated at this time  Hypertension -PTA losartan 25 mg daily, likely need to increase this  For questions or updates, please contact [facility] Please consult [email or web address] for contact info under    Signed,  David Stall, PA-C  12/03/2022 2:28 PM

## 2022-12-03 NOTE — Telephone Encounter (Signed)
Called pt to f/u BP readings per providers request from result note.  Pt reports BP today 162/77-71; the following readings were from July: 160/84, 162/87, 145/65, 155/?Marland Kitchen Takes Losartan 25 mg PO every night 8-9 pm without missing doses.   Reported the following: Felt nauseous around 3-4 am this morning woke from sleep.  Felt as if heart would stop beating.  Reports friend awoke d/t events reports had to shake pt to make her alert.  Reports this happened twice last night; only time has happened since OV with Clare Gandy, NP.  Advised will send message to be addressed.

## 2022-12-03 NOTE — Telephone Encounter (Signed)
Reviewed pt symptoms with provider advises pt should report to ED.  Pt reports she feels okay went to have her hair done today BP 143/76-66.  But is agreeable to ED evaluation.  " To clarify my previous note: Pt wakes up extremely nauseated and coughing.  Then goes out/ lethargic.  Her commotion woke up her friend and he noticed she was out of it and had to shake pt to wake her up. "

## 2022-12-03 NOTE — H&P (Signed)
History and Physical    April [name] UEA:[id number] DOB: 1945-06-07 DOA: 12/03/2022  PCP: Marina Goodell, MD (Confirm with patient/family/NH records and if not entered, this has to be entered at [facility] point of entry) Patient coming from: Home  I have personally briefly reviewed patient's old medical records in [facility]  Chief Complaint: My heart was so slow  HPI: April [name] is a 77 y.o. female with medical history significant of CAD/MI, PE status post IVC filter, HTN, HLD, presented with syncope.  Last night, during sleep, patient's partner informed the patient started to have gagging, he tried to wake him up by pushing and driving but patient was unresponsive for about 10 seconds.  When he checked her pulses, he found his pulse ox was" very slow".  Then after about 10 seconds, patient woke up complaining about feeling lightheaded and nausea.  Then patient had second episode last night similar to the first episode.  2 months ago, patient was hospitalized for syncope episode, sleep apnea was suspected and patient was told to set up outpatient sleep study.  Patient however reported that the sleep study not yet done.  She was followed by cardiology since last hospitalization, and Zio patch found a 13 seconds sinus pauses and nocturnal bradycardia in July and for which her atenolol was discontinued and she was started on losartan.  Today, she called er cardiology who sent her to ED.  ED Course: Afebrile, blood pressure 170/91, heart rate 67.  EKG showed normal sinus rhythm, no QTc or PR interval changes.  K4.5, hemoglobin 11.6 A1c 6.0.  Review of Systems: As per HPI otherwise 14 point review of systems negative.    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  April Fraction, MD   Pulmonary embolism (HCC) 2016   Pulmonary embolism (HCC) 2016   Syncope    Vertigo 1990   "inner ear"    Past Surgical History:  Procedure Laterality Date   ABDOMINAL HYSTERECTOMY     APPENDECTOMY     BREAST BIOPSY Right 04/30/2020   11:00 5cmfn Qmarker-benign, 11:00 8 cmfn twisted X marker-benign   CARDIAC CATHETERIZATION  2005   CATARACT EXTRACTION W/PHACO Left 02/08/2019   Procedure: CATARACT EXTRACTION PHACO AND INTRAOCULAR LENS PLACEMENT (IOC) LEFT 00:40.0  20.6%  8.26;  Surgeon: Galen Manila, MD;  Location: [facility];  Service: Ophthalmology;  Laterality: Left;   CATARACT EXTRACTION W/PHACO Right 04/26/2019   Procedure: CATARACT EXTRACTION PHACO AND INTRAOCULAR LENS PLACEMENT (IOC) RIGHT 6.83 00:46.0;  Surgeon: Galen Manila, MD;  Location: [facility];  Service: Ophthalmology;  Laterality: Right;   CHOLECYSTECTOMY     CLOSED REDUCTION NASAL FRACTURE Bilateral 11/05/2022   Procedure: CLOSED REDUCTION NASAL FRACTURE;  Surgeon: Geanie Logan, MD;  Location: [facility] ORS;  Service: ENT;  Laterality: Bilateral;   COLONOSCOPY  2017   CORONARY ANGIOPLASTY WITH STENT PLACEMENT     FOOT SURGERY Right    IVC FILTER INSERTION N/A  07/08/2022   Procedure: IVC FILTER INSERTION;  Surgeon: Renford Dills, MD;  Location: [facility] INV[facility] CV LAB;  Service: Cardiovascular;  Laterality: N/A;   IVC FILTER REMOVAL N/A 09/23/2022   Procedure: IVC FILTER REMOVAL;  Surgeon: Renford Dills, MD;  Location: [facility] INV[facility] CV LAB;  Service: Cardiovascular;  Laterality: N/A;   TOTAL KNEE ARTHROPLASTY Right 07/21/2022   Procedure: TOTAL KNEE ARTHROPLASTY;  Surgeon: Reinaldo Berber, MD;  Location: [facility] ORS;  Service: Orthopedics;  Laterality: Right;     reports that she has never smoked.  She has never used smokeless tobacco. She reports that she does not drink alcohol and does not use drugs.  Allergies  Allergen Reactions   Antihistamines, Chlorpheniramine-Type Other (See Comments)    "think crazy thoughts"   Ciprofloxacin Other (See Comments)    Patient cannot remember reaction   Dristan Cold [Chlorphen-Pe-Acetaminophen] Other (See Comments)    "think crazy thoughts"   Ginger Diarrhea   Nitrofurantoin Nausea Only   Celecoxib Rash    Family History  Problem Relation Age of Onset   Stroke Mother    CAD Father    Colon cancer Other        paternal great grandfather   Breast cancer Neg Hx      Prior to Admission medications   Medication Sig Start Date End Date Taking? Authorizing Provider  acetaminophen (TYLENOL) 500 MG tablet Take 2 tablets (1,000 mg total) by mouth every 8 (eight) hours. Patient taking differently: Take 1,000 mg by mouth every 8 (eight) hours as needed for mild pain. 07/22/22   Evon Slack, PA-C  amoxicillin (AMOXIL) 500 MG tablet Take 500 mg by mouth 3 (three) times daily.    [provider]  aspirin EC 81 MG tablet Take 81 mg by mouth. 09/24/05   [provider]  atorvastatin (LIPITOR) 40 MG tablet Take 40 mg by mouth at bedtime.    [provider]  calcium citrate-vitamin D (CITRACAL+D) 315-200 MG-UNIT tablet Take 1 tablet by mouth 2 (two) times daily.    [provider]  Cholecalciferol (VITAMIN D3) 2000 UNITS capsule Take 1 capsule by mouth daily.    [provider]  EPINEPHrine (EPIPEN 2-PAK) 0.3 mg/0.3 mL IJ SOAJ injection Inject 0.3 mLs (0.3 mg total) into the muscle as needed for anaphylaxis. 10/31/19   Minna Antis, MD  Homeopathic Products (FRANKINCENSE UPLIFTING) OIL Inhale into the lungs.    [provider]  losartan (COZAAR) 25 MG tablet Take 1 tablet (25 mg total) by mouth daily. 11/21/22 02/19/23  Sherie Don, NP  Turmeric (QC TUMERIC COMPLEX PO) Take 1,000 mg by mouth  daily at 2 PM.    [provider]    Physical Exam: Vitals:   12/03/22 1341 12/03/22 1343  BP:  (!) 172/91  Pulse:  67  Resp:  18  Temp:  98.2 F (36.8 C)  TempSrc:  Oral  SpO2:  99%  Weight: 83 kg   Height: 5\' 7"  (1.702 m)     Constitutional: NAD, calm, comfortable Vitals:   12/03/22 1341 12/03/22 1343  BP:  (!) 172/91  Pulse:  67  Resp:  18  Temp:  98.2 F (36.8 C)  TempSrc:  Oral  SpO2:  99%  Weight: 83 kg   Height: 5\' 7"  (1.702 m)    Eyes: PERRL, lids and conjunctivae normal ENMT: Mucous membranes are moist. Posterior pharynx clear of any exudate or lesions.Normal dentition.  Neck: normal, supple, no masses, no thyromegaly Respiratory:  clear to auscultation bilaterally, no wheezing, no crackles. Normal respiratory effort. No accessory muscle use.  Cardiovascular: Regular rate and rhythm, no murmurs / rubs / gallops. No extremity edema. 2+ pedal pulses. No carotid bruits.  Abdomen: no tenderness, no masses palpated. No hepatosplenomegaly. Bowel sounds positive.  Musculoskeletal: no clubbing / cyanosis. No joint deformity upper and lower extremities. Good ROM, no contractures. Normal muscle tone.  Skin: no rashes, lesions, ulcers. No induration Neurologic: CN 2-12 grossly intact. Sensation intact, DTR normal. Strength 5/5 in all 4.  Psychiatric: Normal judgment and insight. Alert and oriented x 3. Normal mood.     Labs on Admission: I have personally reviewed following labs and imaging studies  CBC: No results for input(s): "WBC", "NEUTROABS", "HGB", "HCT", "MCV", "PLT" in the last 168 hours. Basic Metabolic Panel: Recent Labs  Lab 12/02/22 0945  NA 143  K 4.5  CL 105  CO2 20  GLUCOSE 114*  BUN 29*  CREATININE 0.95  CALCIUM 9.7   GFR: Estimated Creatinine Clearance: 55 mL/min (by C-G formula based on SCr of 0.95 mg/dL). Liver Function Tests: No results for input(s): "AST", "ALT", "ALKPHOS", "BILITOT", "PROT", "ALBUMIN" in the last 168  hours. No results for input(s): "LIPASE", "AMYLASE" in the last 168 hours. No results for input(s): "AMMONIA" in the last 168 hours. Coagulation Profile: No results for input(s): "INR", "PROTIME" in the last 168 hours. Cardiac Enzymes: No results for input(s): "CKTOTAL", "CKMB", "CKMBINDEX", "TROPONINI" in the last 168 hours. BNP (last 3 results) No results for input(s): "PROBNP" in the last 8760 hours. HbA1C: No results for input(s): "HGBA1C" in the last 72 hours. CBG: No results for input(s): "GLUCAP" in the last 168 hours. Lipid Profile: No results for input(s): "CHOL", "HDL", "LDLCALC", "TRIG", "CHOLHDL", "LDLDIRECT" in the last 72 hours. Thyroid Function Tests: No results for input(s): "TSH", "T4TOTAL", "FREET4", "T3FREE", "THYROIDAB" in the last 72 hours. Anemia Panel: No results for input(s): "VITAMINB12", "FOLATE", "FERRITIN", "TIBC", "IRON", "RETICCTPCT" in the last 72 hours. Urine analysis:    Component Value Date/Time   COLORURINE YELLOW (A) 07/11/2022 0953   APPEARANCEUR CLEAR (A) 07/11/2022 0953   APPEARANCEUR Clear 12/19/2020 1518   LABSPEC 1.015 07/11/2022 0953   PHURINE 5.0 07/11/2022 0953   GLUCOSEU NEGATIVE 07/11/2022 0953   HGBUR NEGATIVE 07/11/2022 0953   BILIRUBINUR NEGATIVE 07/11/2022 0953   BILIRUBINUR Negative 12/19/2020 1518   KETONESUR NEGATIVE 07/11/2022 0953   PROTEINUR NEGATIVE 07/11/2022 0953   NITRITE NEGATIVE 07/11/2022 0953   LEUKOCYTESUR NEGATIVE 07/11/2022 0953    Radiological Exams on Admission: No results found.  EKG: Independently reviewed.  Sinus rhythm, no PR or QTc interval changes  Assessment/Plan Principal Problem:   Bradycardia Active Problems:   Essential hypertension   Syncope  (please populate well all problems here in Problem List. (For example, if patient is on BP meds at home and you resume or decide to hold them, it is a problem that needs to be her. Same for CAD, COPD, HLD and so on)  Recurrent syncope -Clinically  suspect recurrent sinus pauses/sinus bradycardia.  Underlying etiology likely undiagnosed and untreated OSA -Continue to track telemonitoring and nocturnal pulse ox, reiterated importance of formal sleep study.  Patient agreed.  Consult case management for outpatient sleep study -ED physician discussed case with cardiology, will see patient for consult.  Defer to cardiology for further EP/PPM study. -TSH sent  Hx of PE -Off Eliquis, on IVC filter -Recent admission for similar syncope episode, CTA study showed negative PE  HTN -Continue  losartan  History of CAD/MI -Denies any chest pain, EKG showed no acute ST changes -Continue aspirin statin and losartan  DVT prophylaxis: Lovenox Code Status: Full code Family Communication: Significant other at bedside Disposition Plan: Expect less than 2 midnight hospital stay Consults called: Cardiology Admission status: Tele obs   Emeline General MD [facility] Pager [phone]  12/03/2022, 2:42 PM

## 2022-12-04 ENCOUNTER — Inpatient Hospital Stay (HOSPITAL_COMMUNITY)
Admit: 2022-12-04 | Discharge: 2022-12-04 | Disposition: A | Discharge status: Home / Self Care | Payer: HMO | Attending: Nurse Practitioner | Admitting: Nurse Practitioner

## 2022-12-04 DIAGNOSIS — R001 Bradycardia, unspecified: Secondary | ICD-10-CM

## 2022-12-04 DIAGNOSIS — R55 Syncope and collapse: Secondary | ICD-10-CM | POA: Diagnosis not present

## 2022-12-04 DIAGNOSIS — I10 Essential (primary) hypertension: Secondary | ICD-10-CM | POA: Diagnosis not present

## 2022-12-04 LAB — BASIC METABOLIC PANEL
Anion gap: 10 (ref 5–15)
BUN: 22 mg/dL (ref 8–23)
CO2: 23 mmol/L (ref 22–32)
Calcium: 9.1 mg/dL (ref 8.9–10.3)
Chloride: 105 mmol/L (ref 98–111)
Creatinine, Ser: 0.86 mg/dL (ref 0.44–1.00)
GFR, Estimated: >60 mL/min (ref >60)
Glucose, Bld: 146 mg/dL — ABNORMAL HIGH (ref 70–99)
Potassium: 3.6 mmol/L (ref 3.5–5.1)
Sodium: 138 mmol/L (ref 135–145)

## 2022-12-04 LAB — GLUCOSE, CAPILLARY: Glucose-Capillary: 100 mg/dL — ABNORMAL HIGH (ref 70–99)

## 2022-12-04 LAB — MAGNESIUM: Magnesium: 2.1 mg/dL (ref 1.7–2.4)

## 2022-12-04 MED ORDER — ONDANSETRON HCL 4 MG PO TABS: 4.0000 mg | ORAL_TABLET | Freq: Every day | ORAL | 1 refills | Status: AC | PRN

## 2022-12-04 NOTE — Progress Notes (Signed)
Patient discharging home with Zio patch on. No questions at this time.

## 2022-12-04 NOTE — Progress Notes (Signed)
Cardiology Progress Note   Patient Name: April [name] Date of Encounter: 12/04/2022  Primary Cardiologist: Debbe Odea, MD  Subjective   Feels well this AM.  Had an episode of mild and brief nausea with mild associated bradycardia on telemetry overnight though no significant pauses.  Eager for discharge.  Inpatient Medications    Scheduled Meds:  aspirin EC  81 mg Oral Daily   atorvastatin  40 mg Oral Daily   enoxaparin (LOVENOX) injection  40 mg Subcutaneous Q24H   losartan  50 mg Oral QHS   sodium chloride flush  3 mL Intravenous Q12H   Continuous Infusions:  PRN Meds: acetaminophen   Vital Signs    Vitals:   12/03/22 1921 12/03/22 2314 12/04/22 0303 12/04/22 0858  BP: (!) 169/59 (!) 179/59 (!) 175/68 (!) 151/56  Pulse: 71 71 73 68  Resp: 18 18 14    Temp: 98 F (36.7 C) 98.5 F (36.9 C) 98.5 F (36.9 C) 98.3 F (36.8 C)  TempSrc:    Oral  SpO2: 99% 98% 98% 97%  Weight:      Height:        Intake/Output Summary (Last 24 hours) at 12/04/2022 0951 Last data filed at 12/04/2022 0304 Gross per 24 hour  Intake 3 ml  Output 0 ml  Net 3 ml   Filed Weights   12/03/22 1341  Weight: 83 kg    Physical Exam   GEN: Well nourished, well developed, in no acute distress.  HEENT: Grossly normal.  Neck: Supple, no JVD, carotid bruits, or masses. Cardiac: RRR, no murmurs, rubs, or gallops. No clubbing, cyanosis, edema.  Radials 2+, DP/PT 2+ and equal bilaterally.  Respiratory:  Respirations regular and unlabored, clear to auscultation bilaterally. GI: Soft, nontender, nondistended, BS + x 4. MS: no deformity or atrophy. Skin: warm and dry, no rash. Neuro:  Strength and sensation are intact. Psych: AAOx3.  Normal affect.  Labs    Chemistry Recent Labs  Lab 12/02/22 0945  NA 143  K 4.5  CL 105  CO2 20  GLUCOSE 114*  BUN 29*  CREATININE 0.95  CALCIUM 9.7     Radiology    No results found.  Telemetry    RSR, sinus brady, no significant  pauses - Personally Reviewed  Cardiac Studies   2D Echocardiogram 6.2024  1. Left ventricular ejection fraction, by estimation, is 65 to 70%. The  left ventricle has normal function. The left ventricle has no regional  wall motion abnormalities. Left ventricular diastolic parameters were  normal. The average left ventricular  global longitudinal strain is -18.4 %. The global longitudinal strain is  normal.   2. Right ventricular systolic function is normal. The right ventricular  size is normal. Tricuspid regurgitation signal is inadequate for assessing  PA pressure.   3. The mitral valve is normal in structure. Trivial mitral valve  regurgitation. No evidence of mitral stenosis.   4. The aortic valve is tricuspid. Aortic valve regurgitation is mild. No  aortic stenosis is present.   5. Pulmonic valve regurgitation is moderate.  _____________   Patient Profile     77 y.o. female with a hx of CAD status post DES to the left circumflex in 2006 for non-STEMI, hypertension, hyperlipidemia, prior provoked PE, status post IVC filter with unsuccessful removal, prothrombin gene mutation, and nocturnal bradycardia and sleep disordered breathing, who was admitted 8/7 due to possible syncope.  Assessment & Plan    1.  Presyncope/bradycardia: Patient with prior history  of documented nocturnal bradycardia with up to 13.1-second pause noted on recent monitoring in the setting of beta-blocker therapy, which was subsequently discontinued.  She awoke with nausea and dry heaving in the early morning hours of August 7, which was followed by brief loss of consciousness witnessed by her partner.  She had recurrent event of mild and brief nausea with mild bradycardia close no significant pauses or high-grade heart block noted on telemetry.  Continue to suspect vasovagal nature of bradycardia.  Question potential need for outpatient evaluation for nausea.  Plan for outpatient sleep study/pulmonology evaluation  in the works.  We will place another ZIO AT given recurrent symptoms, to assess for recurrent arrhythmia of beta-blocker therapy.  Outpatient cardiology follow-up later this month as planned.  2.  Sleep disordered breathing: Pending outpatient evaluation for sleep study.  3.  History of provoked PE with prothrombin gene mutation: Previously on Eliquis and status post IVC filter with unsuccessful removal in May 2024.  4.  Coronary artery disease: Status post PCI drug-eluting stent placement to left circumflex 2006.  No chest pain or dyspnea.  She remains on aspirin and statin therapy.  5.  Hypertension: Losartan increased to 50 mg daily however, she did not receive any losartan yesterday and is not scheduled to receive 50 mg until this evening.  Can follow-up as an outpatient.  6.  Hyperlipidemia: Continue statin therapy.  Signed, Nicolasa Ducking, NP  12/04/2022, 9:51 AM    For questions or updates, please contact   Please consult [email or web address] for contact info under Cardiology/STEMI.

## 2022-12-04 NOTE — Discharge Summary (Signed)
Physician Discharge Summary  Steffanie Condor AVW:[id number] DOB: 02-16-1946 DOA: 12/03/2022  PCP: Marina Goodell, MD  Admit date: 12/03/2022 Discharge date: 12/04/2022  Admitted From: Home Disposition:  Home  Recommendations for Outpatient Follow-up:  Follow up with PCP in 1-2 weeks Follow-up with [facility] pulmonology for sleep study  Home Health: No Equipment/Devices: Event monitor  Discharge Condition: Stable CODE STATUS: Full Diet recommendation: Heart healthy  Brief/Interim Summary:  77 y.o. female with medical history significant of CAD/MI, PE status post IVC filter, HTN, HLD, presented with syncope.   Last night, during sleep, patient's partner informed the patient started to have gagging, he tried to wake him up by pushing and driving but patient was unresponsive for about 10 seconds.  When he checked her pulses, he found his pulse ox was" very slow".  Then after about 10 seconds, patient woke up complaining about feeling lightheaded and nausea.  Then patient had second episode last night similar to the first episode.  2 months ago, patient was hospitalized for syncope episode, sleep apnea was suspected and patient was told to set up outpatient sleep study.  Patient however reported that the sleep study not yet done.  She was followed by cardiology since last hospitalization, and Zio patch found a 13 seconds sinus pauses and nocturnal bradycardia in July and for which her atenolol was discontinued and she was started on losartan.  Today, she called er cardiology who sent her to ED.  Seen in consultation by cardiology.  Telemetry reviewed.  No abnormalities noted.  No medication changes recommended per cardiology.  Continue home losartan.  Zio patch placed at time of discharge.  Outpatient referral to [facility] pulmonology made at time of DC.  Patient will need to follow-up with [facility] office as this is the next available appointment.  Suspicion that patient's episodes of  apnea are related to underlying untreated sleep apnea    Discharge Diagnoses:  Principal Problem:   Bradycardia Active Problems:   Essential hypertension   Syncope   Unresponsive episode  Recurrent syncope Likely related to undiagnosed and untreated sleep apnea.  No changes noted on telemetry.  No medication changes per cardiology.  ZIO patch placed prior to discharge.  Ambulatory referral to pulmonology for sleep study.  Discharge Instructions  Discharge Instructions     Ambulatory referral to Pulmonology   Complete by: As directed    Sleep study evaluaiton   Reason for referral: Other   Diet - low sodium heart healthy   Complete by: As directed    Increase activity slowly   Complete by: As directed       Allergies as of 12/04/2022       Reactions   Antihistamines, Chlorpheniramine-type Other (See Comments)   "think crazy thoughts"   Ciprofloxacin Other (See Comments)   Patient cannot remember reaction   Dristan Cold [chlorphen-pe-acetaminophen] Other (See Comments)   "think crazy thoughts"   Ginger Diarrhea   Nitrofurantoin Nausea Only   Celecoxib Rash        Medication List     TAKE these medications    acetaminophen 500 MG tablet Commonly known as: TYLENOL Take 2 tablets (1,000 mg total) by mouth every 8 (eight) hours. What changed:  when to take this reasons to take this   aspirin EC 81 MG tablet Take 81 mg by mouth daily.   atorvastatin 40 MG tablet Commonly known as: LIPITOR Take 40 mg by mouth at bedtime.   calcium citrate-vitamin D 315-200 MG-UNIT tablet  Commonly known as: CITRACAL+D Take 1 tablet by mouth daily.   losartan 25 MG tablet Commonly known as: COZAAR Take 1 tablet (25 mg total) by mouth daily. What changed: when to take this   multivitamin with minerals Tabs tablet Take 1 tablet by mouth 2 (two) times a week.   ondansetron 4 MG tablet Commonly known as: Zofran Take 1 tablet (4 mg total) by mouth daily as needed for  nausea or vomiting.   Vitamin D3 50 MCG (2000 UT) capsule Take 2,000 Units by mouth daily.        Allergies  Allergen Reactions   Antihistamines, Chlorpheniramine-Type Other (See Comments)    "think crazy thoughts"   Ciprofloxacin Other (See Comments)    Patient cannot remember reaction   Dristan Cold [Chlorphen-Pe-Acetaminophen] Other (See Comments)    "think crazy thoughts"   Ginger Diarrhea   Nitrofurantoin Nausea Only   Celecoxib Rash    Consultations: Cardiology   Procedures/Studies: LONG TERM MONITOR (3-14 DAYS)  Result Date: 11/23/2022 Event monitor Patch Wear Time:  13 days and 13 hours (2024-07-02T18:56:46-0400 to 2024-07-16T08:54:31-0400) Normal sinus rhythm Patient had a min HR of 37 bpm, max HR of 193 bpm, and avg HR of 62 bpm. 37 Supraventricular Tachycardia runs occurred, the run with the fastest interval lasting 7 beats with a max rate of 193 bpm, the longest lasting 10.9 secs with an avg rate of 120 bpm. 3 Pauses occurred, the longest lasting 13.1 secs (5 bpm).  Noted at 3 AM Isolated SVEs were rare (<1.0%), SVE Couplets were rare (<1.0%), and SVE Triplets were rare (<1.0%). Isolated VEs were rare (<1.0%), and no VE Couplets or VE Triplets were present. Ventricular Bigeminy was present. Patient triggered events associated with normal sinus rhythm Signed, Dossie Arbour, MD, Ph.D [facility]     Subjective: Seen and examined on the day of discharge.  Stable no distress.  Appropriate for discharge home.  Discharge Exam: Vitals:   12/04/22 0303 12/04/22 0858  BP: (!) 175/68 (!) 151/56  Pulse: 73 68  Resp: 14   Temp: 98.5 F (36.9 C) 98.3 F (36.8 C)  SpO2: 98% 97%   Vitals:   12/03/22 1921 12/03/22 2314 12/04/22 0303 12/04/22 0858  BP: (!) 169/59 (!) 179/59 (!) 175/68 (!) 151/56  Pulse: 71 71 73 68  Resp: 18 18 14    Temp: 98 F (36.7 C) 98.5 F (36.9 C) 98.5 F (36.9 C) 98.3 F (36.8 C)  TempSrc:    Oral  SpO2: 99% 98% 98% 97%  Weight:       Height:        General: Pt is alert, awake, not in acute distress Cardiovascular: RRR, S1/S2 +, no rubs, no gallops Respiratory: CTA bilaterally, no wheezing, no rhonchi Abdominal: Soft, NT, ND, bowel sounds + Extremities: no edema, no cyanosis    The results of significant diagnostics from this hospitalization (including imaging, microbiology, ancillary and laboratory) are listed below for reference.     Microbiology: No results found for this or any previous visit (from the past 240 hour(s)).   Labs: BNP (last 3 results) No results for input(s): "BNP" in the last 8760 hours. Basic Metabolic Panel: Recent Labs  Lab 12/02/22 0945 12/04/22 0910  NA 143 138  K 4.5 3.6  CL 105 105  CO2 20 23  GLUCOSE 114* 146*  BUN 29* 22  CREATININE 0.95 0.86  CALCIUM 9.7 9.1  MG  --  2.1   Liver Function Tests: No results for input(s): "  AST", "ALT", "ALKPHOS", "BILITOT", "PROT", "ALBUMIN" in the last 168 hours. No results for input(s): "LIPASE", "AMYLASE" in the last 168 hours. No results for input(s): "AMMONIA" in the last 168 hours. CBC: No results for input(s): "WBC", "NEUTROABS", "HGB", "HCT", "MCV", "PLT" in the last 168 hours. Cardiac Enzymes: No results for input(s): "CKTOTAL", "CKMB", "CKMBINDEX", "TROPONINI" in the last 168 hours. BNP: Invalid input(s): "POCBNP" CBG: Recent Labs  Lab 12/04/22 0557  GLUCAP 100*   D-Dimer No results for input(s): "DDIMER" in the last 72 hours. Hgb A1c No results for input(s): "HGBA1C" in the last 72 hours. Lipid Profile No results for input(s): "CHOL", "HDL", "LDLCALC", "TRIG", "CHOLHDL", "LDLDIRECT" in the last 72 hours. Thyroid function studies Recent Labs    12/03/22 2002  TSH 1.581   Anemia work up No results for input(s): "VITAMINB12", "FOLATE", "FERRITIN", "TIBC", "IRON", "RETICCTPCT" in the last 72 hours. Urinalysis    Component Value Date/Time   COLORURINE YELLOW (A) 07/11/2022 0953   APPEARANCEUR CLEAR (A)  07/11/2022 0953   APPEARANCEUR Clear 12/19/2020 1518   LABSPEC 1.015 07/11/2022 0953   PHURINE 5.0 07/11/2022 0953   GLUCOSEU NEGATIVE 07/11/2022 0953   HGBUR NEGATIVE 07/11/2022 0953   BILIRUBINUR NEGATIVE 07/11/2022 0953   BILIRUBINUR Negative 12/19/2020 1518   KETONESUR NEGATIVE 07/11/2022 0953   PROTEINUR NEGATIVE 07/11/2022 0953   NITRITE NEGATIVE 07/11/2022 0953   LEUKOCYTESUR NEGATIVE 07/11/2022 0953   Sepsis Labs No results for input(s): "WBC" in the last 168 hours.  Invalid input(s): "PROCALCITONIN", "LACTICIDVEN" Microbiology No results found for this or any previous visit (from the past 240 hour(s)).   Time coordinating discharge: Over 30 minutes  SIGNED:   Tresa Moore, MD  [facility] 12/04/2022, 2:13 PM Pager   If 7PM-7AM, please contact night-coverage

## 2022-12-18 ENCOUNTER — Ambulatory Visit: Payer: HMO | Admitting: Cardiology

## 2022-12-21 NOTE — Progress Notes (Signed)
Cardiology Office Note Date:  12/22/2022  Patient ID:  April [name], April [name] 13-May-1945, MRN [id number] PCP:  Marina Goodell, MD  Cardiologist:  Debbe Odea, MD (has seen inpatient only) Electrophysiologist: None    Chief Complaint: nocturnal sinus pauses   History of Present Illness: April [name] is a 77 y.o. female with PMH notable for CAD s/p PCI (2006), HTN, prior provoked PE s/p IVC filter with unsuccessful removal, syncope; seen today routine follow-up.  The patient was eval'd in the hospital by cardiology team 09/2022 after syncopizing. Prior to syncope, she had prodrome of nausea - this was second syncope event. Ambulatory monitor at discharge showed significant nocturnal pauses up to 13 seconds. I saw her in follow-up about a month ago and recommended a referral to pulm for sleep study, and to stop her atenolol. She presented to ER 12/03/2022 after feeling nauseated at night and feeling as though she would pass out. Zio live monitor applied at discharge, has not resulted.   On follow-up today, her main complaint is feeling heart beating hard in neck/ear. Denies chest pain, chest pressure. Has had no further feelings of nausea or pre-syncope at night. She is pending pulm eval, scheduled in September for OSA eval.   BP readings at home are 120-140 systolics, HRs in 70-80s.    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  Gerarda Fraction, MD   Pulmonary embolism (HCC) 2016   Pulmonary embolism (HCC) 2016   Syncope    Vertigo 1990   "inner ear"    Past Surgical History:  Procedure  Laterality Date   ABDOMINAL HYSTERECTOMY     APPENDECTOMY     BREAST BIOPSY Right 04/30/2020   11:00 5cmfn Qmarker-benign, 11:00 8 cmfn twisted X marker-benign   CARDIAC CATHETERIZATION  2005   CATARACT EXTRACTION W/PHACO Left 02/08/2019   Procedure: CATARACT EXTRACTION PHACO AND INTRAOCULAR LENS PLACEMENT (IOC) LEFT 00:40.0  20.6%  8.26;  Surgeon: Galen Manila, MD;  Location: [facility];  Service: Ophthalmology;  Laterality: Left;   CATARACT EXTRACTION W/PHACO Right 04/26/2019   Procedure: CATARACT EXTRACTION PHACO AND INTRAOCULAR LENS PLACEMENT (IOC) RIGHT 6.83 00:46.0;  Surgeon: Galen Manila, MD;  Location: [facility];  Service: Ophthalmology;  Laterality: Right;   CHOLECYSTECTOMY     CLOSED REDUCTION NASAL FRACTURE Bilateral 11/05/2022   Procedure: CLOSED REDUCTION NASAL FRACTURE;  Surgeon: Geanie Logan, MD;  Location: [facility] OR[facility];  Service: ENT;  Laterality: Bilateral;   COLONOSCOPY  2017   CORONARY ANGIOPLASTY WITH STENT PLACEMENT     FOOT SURGERY Right    IVC FILTER INSERTION N/A 07/08/2022   Procedure: IVC FILTER INSERTION;  Surgeon: Renford Dills, MD;  Location: [facility];  Service: Cardiovascular;  Laterality: N/A;   IVC FILTER REMOVAL N/A 09/23/2022   Procedure: IVC FILTER REMOVAL;  Surgeon: Renford Dills, MD;  Location: [facility] INVAS[facility] CV [facility];  Service: Cardiovascular;  Laterality: N/A;   TOTAL KNEE ARTHROPLASTY Right 07/21/2022  Procedure: TOTAL KNEE ARTHROPLASTY;  Surgeon: Reinaldo Berber, MD;  Location: [facility] OR[facility];  Service: Orthopedics;  Laterality: Right;    Current Outpatient Medications  Medication Instructions   acetaminophen (TYLENOL) 1,000 mg, Oral, Every 8 hours   aspirin EC 81 mg, Oral, Daily   atorvastatin (LIPITOR) 40 mg, Oral, Nightly   calcium citrate-vitamin D (CITRACAL+D) 315-200 MG-UNIT tablet 1 tablet, Oral, Daily   losartan (COZAAR) 25 mg, Oral, Daily   Multiple Vitamin (MULTIVITAMIN WITH MINERALS) TABS tablet  1 tablet, Oral, 2 times weekly   ondansetron (ZOFRAN) 4 mg, Oral, Daily PRN   Vitamin D3 2,000 Units, Oral, Daily    Social History:  The patient  reports that she has never smoked. She has never used smokeless tobacco. She reports that she does not drink alcohol and does not use drugs.   Family History:  The patient's family history includes CAD in her father; Colon cancer in an other family member; Stroke in her mother.  ROS:  Please see the history of present illness. All other systems are reviewed and otherwise negative.   PHYSICAL EXAM:  VS:  BP (!) 140/60 (BP Location: Left Arm, Patient Position: Sitting, Cuff Size: Large)   Pulse 77   Ht 5\' 7"  (1.702 m)   Wt 184 lb (83.5 kg)   SpO2 98%   BMI 28.82 kg/m  BMI: Body mass index is 28.82 kg/m.  GEN- The patient is well appearing, alert and oriented x 3 today.   Lungs- Clear to ausculation bilaterally, normal work of breathing.  Heart- Regular rate and rhythm, no murmurs, rubs or gallops Extremities- Trace peripheral edema, warm, dry   EKG is ordered. Personal review of EKG from today shows:    EKG Interpretation Date/Time:  Monday December 22 2022 10:01:05 EDT Ventricular Rate:  77 PR Interval:  170 QRS Duration:  64 QT Interval:  360 QTC Calculation: 407 R Axis:   -9  Text Interpretation: Normal sinus rhythm Confirmed by Sherie Don [phone]) on 12/22/2022 10:07:55 AM    Recent Labs: 10/23/2022: ALT 15 10/24/2022: Hemoglobin 10.4; Platelets 206 12/03/2022: TSH 1.581 12/04/2022: BUN 22; Creatinine, Ser 0.86; Magnesium 2.1; Potassium 3.6; Sodium 138  No results found for requested labs within last 365 days.   Estimated Creatinine Clearance: 60.9 mL/min (by C-G formula based on SCr of 0.86 mg/dL).   Wt Readings from Last 3 Encounters:  12/22/22 184 lb (83.5 kg)  12/03/22 183 lb (83 kg)  11/21/22 191 lb 3.2 oz (86.7 kg)     Additional studies reviewed include: Previous EP, cardiology notes.   Long Term monitor,  in-progress 12/04/22 -  On my review, no significant pauses One NSVT episode, 4 beats in duration  Long term monitor, 11/18/2022 Normal sinus rhythm Patient had a min HR of 37 bpm, max HR of 193 bpm, and avg HR of 62 bpm.   37 Supraventricular Tachycardia runs occurred, the run with the fastest interval lasting 7 beats with a max rate of 193 bpm, the longest lasting 10.9 secs with an avg rate of 120 bpm.   3 Pauses occurred, the longest lasting 13.1 secs (5 bpm).  Noted at 3 AM  Isolated SVEs were rare (<1.0%), SVE Couplets were rare (<1.0%), and SVE Triplets were rare (<1.0%).  Isolated VEs were rare (<1.0%), and no VE Couplets or VE Triplets were present. Ventricular Bigeminy was present.   Patient triggered events associated with normal sinus rhythm  TTE, 10/24/2022  1. Left ventricular ejection fraction, by estimation, is  65 to 70%. The left ventricle has normal function. The left ventricle has no regional wall motion abnormalities. Left ventricular diastolic parameters were normal. The average left ventricular global longitudinal strain is -18.4 %. The global longitudinal strain is normal.   2. Right ventricular systolic function is normal. The right ventricular size is normal. Tricuspid regurgitation signal is inadequate for assessing PA pressure.   3. The mitral valve is normal in structure. Trivial mitral valve regurgitation. No evidence of mitral stenosis.   4. The aortic valve is tricuspid. Aortic valve regurgitation is mild. No aortic stenosis is present.   5. Pulmonic valve regurgitation is moderate.   ASSESSMENT AND PLAN:  #) nocturnal bradycardia #) concern for OSA Significant pauses overnight on 7/23 ambulatory monitor Has since stopped atenolol, most recent monitor without nocturnal pauses Pending pulm evaluation, currently scheduled for September Avoid all AVN medications  #) syncope No further episodes of syncope  #) HTN Avoid AVN blocking medications d/t nocturnal  pauses as above Continue 25mg  losartan    Current medicines are reviewed at length with the patient today.   The patient does not have concerns regarding her medicines.  The following changes were made today:   none  Labs/ tests ordered today include:  Orders Placed This Encounter  Procedures   EKG 12-Lead     Disposition: Follow up with EP APP in 3 months   Signed, Sherie Don, NP  12/22/22  10:34 AM  Electrophysiology [facility]

## 2022-12-22 ENCOUNTER — Encounter: Payer: Self-pay | Admitting: Cardiology

## 2022-12-22 ENCOUNTER — Ambulatory Visit: Payer: HMO | Attending: Cardiology | Admitting: Cardiology

## 2022-12-22 VITALS — BP 140/60 | HR 77 | Ht 67.0 in | Wt 184.0 lb

## 2022-12-22 DIAGNOSIS — R55 Syncope and collapse: Secondary | ICD-10-CM

## 2022-12-22 DIAGNOSIS — I10 Essential (primary) hypertension: Secondary | ICD-10-CM

## 2022-12-22 DIAGNOSIS — R001 Bradycardia, unspecified: Secondary | ICD-10-CM

## 2022-12-22 NOTE — Patient Instructions (Signed)
Medication Instructions:   Your physician recommends that you continue on your current medications as directed. Please refer to the Current Medication list given to you today.  *If you need a refill on your cardiac medications before your next appointment, please call your pharmacy*   Lab Work:  None  If you have labs (blood work) drawn today and your tests are completely normal, you will receive your results only by: MyChart Message (if you have MyChart) OR A paper copy in the mail If you have any lab test that is abnormal or we need to change your treatment, we will call you to review the results.   Testing/Procedures:  NONE   Follow-Up: At [facility], you and your health needs are our priority.  As part of our continuing mission to provide you with exceptional heart care, we have created designated Provider Care Teams.  These Care Teams include your primary Cardiologist (physician) and Advanced Practice Providers (APPs -  Physician Assistants and Nurse Practitioners) who all work together to provide you with the care you need, when you need it.  We recommend signing up for the patient portal called "MyChart".  Sign up information is provided on this After Visit Summary.  MyChart is used to connect with patients for Virtual Visits (Telemedicine).  Patients are able to view lab/test results, encounter notes, upcoming appointments, etc.  Non-urgent messages can be sent to your provider as well.   To learn more about what you can do with MyChart, go to [email or web address].    Your next appointment:   3 month(s)  Provider:   Sherie Don, NP    *Please schedule appointment with Pulmonary

## 2022-12-30 NOTE — Addendum Note (Signed)
Encounter addended by: Bryna Colander, RN on: 12/30/2022 9:35 AM  Actions taken: Imaging Exam ended

## 2023-01-02 ENCOUNTER — Telehealth: Payer: Self-pay | Admitting: Cardiology

## 2023-01-02 NOTE — Telephone Encounter (Signed)
Patient made aware of results and verbalized understanding.    Sondra Barges, PA-C 01/02/2023 12:55 PM EDT     Her monitor showed a predominant rhythm of sinus with an average rate of 76 bpm (range 55 to 200 bpm) 1 run of NSVT (fast rhythm coming from the bottom portion of the heart), 29 episodes of SVT lasting up to 12.7 seconds, and rare extra beats from the top and bottom portions of the heart.  No significant sustained arrhythmias to suggest etiology of syncope.  Follow-up as scheduled.

## 2023-01-02 NOTE — Telephone Encounter (Signed)
Patient returned RN's call regarding results. 

## 2023-01-06 ENCOUNTER — Ambulatory Visit (INDEPENDENT_AMBULATORY_CARE_PROVIDER_SITE_OTHER): Payer: HMO | Admitting: Primary Care

## 2023-01-06 ENCOUNTER — Encounter: Payer: Self-pay | Admitting: Primary Care

## 2023-01-06 VITALS — BP 132/66 | HR 77 | Ht 67.0 in | Wt 187.4 lb

## 2023-01-06 DIAGNOSIS — R001 Bradycardia, unspecified: Secondary | ICD-10-CM | POA: Diagnosis not present

## 2023-01-06 NOTE — Progress Notes (Signed)
@Patient  ID: April [name], female    DOB: Feb 07, 1946, 77 y.o.   MRN: [id number]  Chief Complaint  Patient presents with   Sleep Consult    Referred by Patrick North, NP. She states had several syncopal episodes in June 2024. She denies any sleep issues.     Referring provider: Marina Goodell, MD  HPI: 77 year old female, never smoked.  Past medical history significant for hypertension, coronary artery disease, bradycardia, history of PE, hyperlipidemia.  01/06/2023 Patient presents today for sleep consult.  She was referred by cardiology due to syncope/ nocturnal bradycardia. She was noted to have significant pauses overnight on 11/18/22 on heart monitor. Long term monitor 11/18/22 showed no significant sustained arrhythmias to suggest etiology of syncope. She has had no further syncopal issues since being started on Losartan. Last syncopal episode was 1st of July.   Patient denies sleep issues. She feels like she sleeps well. No loud snoring. She has never woken herself up gasping or choking. No daytime sleepiness. Palpitations do not occur at night, only during the day.  Bedtime is 11 PM.  Takes her average of 10 to 20 minutes to fall asleep. She wakes up to use the restroom 1-2 times a night.  She starts her day between 730 and 8 AM. No concern for narcolepsy, cataplexy or sleepwalking.  Epworth score is 3.  Sleep questionnaire Symptoms-   No problems  Prior sleep study-  None  Bedtime- 11pm Time to fall asleep- 10-64min Nocturnal awakenings- 2 times Out of bed/start of day- 7:30am-8am Weight changes- no Do you operate heavy machinery- no Do you currently wear CPAP- no Do you current wear oxygen- no  Epworth- 3  Allergies  Allergen Reactions   Antihistamines, Chlorpheniramine-Type Other (See Comments)    "think crazy thoughts"   Atenolol Other (See Comments)    Avoid AVN affecting medications due to bradycardia/syncope   Ciprofloxacin Other (See Comments)    Patient  cannot remember reaction   Dristan Cold [Chlorphen-Pe-Acetaminophen] Other (See Comments)    "think crazy thoughts"   Ginger Diarrhea   Nitrofurantoin Nausea Only   Celecoxib Rash    Immunization History  Administered Date(s) Administered   Influenza Inj Mdck Quad Pf 02/05/2018, 01/14/2019, 02/15/2021   Influenza, High Dose Seasonal PF 02/05/2018, 01/14/2019   Influenza,inj,Quad PF,6+ Mos 02/07/2020   Influenza-Unspecified 01/22/2012, 02/08/2013, 02/02/2015, 01/18/2016, 01/16/2017   Moderna Sars-Covid-2 Vaccination 05/20/2019, 06/17/2019, 03/05/2020   PNEUMOCOCCAL CONJUGATE-20 12/04/2022   Pneumococcal Polysaccharide-23 08/30/2018   Tdap 10/22/2022    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 (Patient taking differently: Take 1,000 mg by mouth every 8 (eight) hours as needed for mild pain.) 30 tablet 0   aspirin EC 81 MG tablet Take 81 mg by mouth daily.     atorvastatin (LIPITOR) 40 MG tablet Take 40 mg by mouth at bedtime.     calcium citrate-vitamin D (CITRACAL+D) 315-200 MG-UNIT tablet Take 1 tablet by mouth daily.     Cholecalciferol (VITAMIN D3) 2000 UNITS  capsule Take 2,000 Units by mouth daily.     losartan (COZAAR) 25 MG tablet Take 1 tablet (25 mg total) by mouth daily. (Patient taking differently: Take 25 mg by mouth at bedtime.) 30 tablet 1   Multiple Vitamin (MULTIVITAMIN WITH MINERALS) TABS tablet Take 1 tablet by mouth 2 (two) times a week.     ondansetron (ZOFRAN) 4 MG tablet Take 1 tablet (4 mg total) by mouth daily as needed for nausea or vomiting. 30 tablet 1   No facility-administered medications prior to visit.   Review of Systems  Review of Systems  Constitutional: Negative.  Negative for fatigue.  Respiratory: Negative.  Negative for apnea.   Psychiatric/Behavioral:  Negative for sleep disturbance.      Physical Exam  BP 132/66 (BP Location: Left Arm, Cuff Size: Normal)   Pulse 77   Ht 5\' 7"  (1.702 m)   Wt 187 lb 6.4 oz (85 kg)   SpO2 97% Comment: on RA  BMI 29.35 kg/m  Physical Exam Constitutional:      Appearance: Normal appearance.  HENT:     Head: Normocephalic and atraumatic.     Mouth/Throat:     Mouth: Mucous membranes are moist.     Pharynx: Oropharynx is clear.  Cardiovascular:     Rate and Rhythm: Normal rate and regular rhythm.  Pulmonary:     Effort: Pulmonary effort is normal.     Breath sounds: Normal breath sounds.  Skin:    General: Skin is warm and dry.  Neurological:     General: No focal deficit present.     Mental Status: She is alert and oriented to person, place, and time. Mental status is at baseline.  Psychiatric:        Mood and Affect: Mood normal.        Behavior: Behavior normal.        Thought Content: Thought content normal.        Judgment: Judgment normal.      Lab Results:  CBC    Component Value Date/Time   WBC 4.7 10/24/2022 0825   RBC 3.43 (L) 10/24/2022 0825   HGB 10.4 (L) 10/24/2022 0825   HCT 31.7 (L) 10/24/2022 0825   PLT 206 10/24/2022 0825   MCV 92.4 10/24/2022 0825   MCH 30.3 10/24/2022 0825   MCHC 32.8 10/24/2022 0825   RDW 12.5 10/24/2022 0825    LYMPHSABS 1.9 07/11/2022 0954   MONOABS 0.4 07/11/2022 0954   EOSABS 0.3 07/11/2022 0954   BASOSABS 0.1 07/11/2022 0954    BMET    Component Value Date/Time   NA 138 12/04/2022 0910   NA 143 12/02/2022 0945   K 3.6 12/04/2022 0910   CL 105 12/04/2022 0910   CO2 23 12/04/2022 0910   GLUCOSE 146 (H) 12/04/2022 0910   BUN 22 12/04/2022 0910   BUN 29 (H) 12/02/2022 0945   CREATININE 0.86 12/04/2022 0910   CALCIUM 9.1 12/04/2022 0910   GFRNONAA >  60 12/04/2022 0910   GFRAA >60 04/16/2015 0532    BNP No results found for: "BNP"  ProBNP No results found for: "PROBNP"  Imaging: LONG TERM MONITOR-LIVE TELEMETRY (3-14 DAYS)  Result Date: 01/01/2023 Patch Wear Time:  13 days and 21 hours (2024-08-08T11:05:18-0400 to 2024-08-22T08:44:37-0400) Patient had a min HR of 55 bpm, max HR of 200 bpm, and avg HR of 76 bpm. Predominant underlying rhythm was Sinus Rhythm. 1 run of Ventricular Tachycardia occurred lasting 4 beats with a max rate of 156 bpm (avg 150 bpm). 29 Supraventricular Tachycardia runs occurred, the run with the fastest interval lasting 7 beats with a max rate of 200 bpm, the longest lasting 12.7 secs with an avg rate of 123 bpm. Some episodes of Supraventricular Tachycardia may be possible Atrial Tachycardia with variable block. Isolated SVEs were rare (<1.0%), SVE Couplets were rare (<1.0%), and SVE Triplets were rare (<1.0%). Isolated VEs were rare (<1.0%, 2934), VE Couplets were rare (<1.0%, 14), and VE Triplets were rare (<1.0%, 1). Ventricular Bigeminy was present. Conclusion Occasional paroxysmal SVT noted. 1 episode of nonsustained VT lasting 4 beats also noted. No significant sustained arrhythmias to suggest etiology of syncope. No high degree AV block or sinus pauses noted.     Assessment & Plan:   Bradycardia - Referred by cardiology due to syncope/nocturnal bradycardia. Since stopping atenolol she has had no further episodes of bradycardia.  Her last syncopal episode  was in the beginning of July.  She denies any sleep issues. Symptoms are not consistent with sleep apnea.  Would recommend holding off on any formal sleep study as she is asymptomatic and cardiac symptoms seem to have resolved after medication adjustment. We check an overnight oximetry test to assess for hypoxemia, if patient has significant nocturnal desaturations would then proceed with getting an in-lab sleep study. Fu as needed.   Glenford Bayley, NP 01/06/2023

## 2023-01-06 NOTE — Assessment & Plan Note (Addendum)
-   Referred by cardiology due to syncope/nocturnal bradycardia. Since stopping atenolol she has had no further episodes of bradycardia.  Her last syncopal episode was in the beginning of July.  She denies any sleep issues. Symptoms are not consistent with sleep apnea.  Would recommend holding off on any formal sleep study as she is asymptomatic and cardiac symptoms seem to have resolved after medication adjustment. We check an overnight oximetry test to assess for hypoxemia, if patient has significant nocturnal desaturations would then proceed with getting an in-lab sleep study. Fu as needed.

## 2023-01-06 NOTE — Patient Instructions (Addendum)
- Symptoms are not consistent with sleep apnea, bradycardia likely medication related  - I would hold off on formal sleep study at this time, I would recommend an over night oximetry test to assess oxygen levels at night. If there are significant oxygen desaturations with associated arrhythmias this would be more concerning for sleep apnea and would then proceed with sleep study in lab  - If you develop loud snoring symptoms, waking up gasping/choking or excessive daytime sleepiness notify office  Orders: - Overnight oximetry test    Follow-up - As needed if symptoms worsen    Sleep Apnea Sleep apnea affects breathing during sleep. It causes breathing to stop for 10 seconds or more, or to become shallow. People with sleep apnea usually snore loudly. It can also increase the risk of: Heart attack. Stroke. Being very overweight (obese). Diabetes. Heart failure. Irregular heartbeat. High blood pressure. The goal of treatment is to help you breathe normally again. What are the causes?  The most common cause of this condition is a collapsed or blocked airway. There are three kinds of sleep apnea: Obstructive sleep apnea. This is caused by a blocked or collapsed airway. Central sleep apnea. This happens when the brain does not send the right signals to the muscles that control breathing. Mixed sleep apnea. This is a combination of obstructive and central sleep apnea. What increases the risk? Being overweight. Smoking. Having a small airway. Being older. Being female. Drinking alcohol. Taking medicines to calm yourself (sedatives or tranquilizers). Having family members with the condition. Having a tongue or tonsils that are larger than normal. What are the signs or symptoms? Trouble staying asleep. Loud snoring. Headaches in the morning. Waking up gasping. Dry mouth or sore throat in the morning. Being sleepy or tired during the day. If you are sleepy or tired during the day,  you may also: Not be able to focus your mind (concentrate). Forget things. Get angry a lot and have mood swings. Feel sad (depressed). Have changes in your personality. Have less interest in sex, if you are female. Be unable to have an erection, if you are female. How is this treated?  Sleeping on your side. Using a medicine to get rid of mucus in your nose (decongestant). Avoiding the use of alcohol, medicines to help you relax, or certain pain medicines (narcotics). Losing weight, if needed. Changing your diet. Quitting smoking. Using a machine to open your airway while you sleep, such as: An oral appliance. This is a mouthpiece that shifts your lower jaw forward. A CPAP device. This device blows air through a mask when you breathe out (exhale). An EPAP device. This has valves that you put in each nostril. A BIPAP device. This device blows air through a mask when you breathe in (inhale) and breathe out. Having surgery if other treatments do not work. Follow these instructions at home: Lifestyle Make changes that your doctor recommends. Eat a healthy diet. Lose weight if needed. Avoid alcohol, medicines to help you relax, and some pain medicines. Do not smoke or use any products that contain nicotine or tobacco. If you need help quitting, ask your doctor. General instructions Take over-the-counter and prescription medicines only as told by your doctor. If you were given a machine to use while you sleep, use it only as told by your doctor. If you are having surgery, make sure to tell your doctor you have sleep apnea. You may need to bring your device with you. Keep all follow-up visits. Contact  a doctor if: The machine that you were given to use during sleep bothers you or does not seem to be working. You do not get better. You get worse. Get help right away if: Your chest hurts. You have trouble breathing in enough air. You have an uncomfortable feeling in your back, arms, or  stomach. You have trouble talking. One side of your body feels weak. A part of your face is hanging down. These symptoms may be an emergency. Get help right away. Call your local emergency services (911 in the [place] Do not wait to see if the symptoms will go away. Do not drive yourself to the hospital. Summary This condition affects breathing during sleep. The most common cause is a collapsed or blocked airway. The goal of treatment is to help you breathe normally while you sleep. This information is not intended to replace advice given to you by your health care provider. Make sure you discuss any questions you have with your health care provider. Document Revised: 11/21/2020 Document Reviewed: 03/23/2020 Elsevier Patient Education  2024 [employer].

## 2023-01-19 ENCOUNTER — Other Ambulatory Visit: Payer: Self-pay | Admitting: Cardiology

## 2023-02-08 NOTE — Progress Notes (Unsigned)
Cardiology Office Note:  .   Date:  02/09/2023  ID:  Lamekia Leppek, DOB 03/23/1946, MRN [id number] PCP: Marina Goodell, MD   [facility] Providers Cardiologist:  Yvonne Kendall, MD     History of Present Illness: .   Discussed the use of AI scribe software for clinical note transcription with the patient, who gave verbal consent to proceed.  Caytlin Closner is a 77 y.o. 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  She was previously followed in our office by Dr. Azucena Cecil and has also seen EP (last seen 12/22/2022 by Sherie Don, NP).  Prior event monitors showed NSVT, PSVT, and nocturnal pauses of up to 13.1 seconds.  She was referred to pulmonology for sleep study, though pulmonology recommended against formal sleep study because "she is asymptomatic and cardiac symptoms seem to have resolved after medication adjustment."  Overnight oxymetry test was ordered though I do not see any results.  Today, Ms. Holl reports that she has been feeling well with no recent episodes of syncope. She reports no dizziness or lightheadedness. She experienced brief episodes of nausea a couple of nights ago, but these have since resolved. She believes her current medication regimen, which includes Losartan, is effective. She was previously on Atenolol, but this was discontinued due to observed slow heart rate. She reports good sleep quality and denies any snoring. She has noticed an increased heart rate when busy or in a hurry, but denies any spontaneous episodes of tachycardia.  Ms. Clarkin has had recent knee surgery, which has limited her physical activity. She has completed therapy for this and plans to increase her activity level. She denies any leg swelling, except when wearing certain shoes. Her blood pressure readings at home are typically in the 130s over  70s. Ms. Cardile had an IVC filter placed prior to her knee surgery. Retrieval attempts were reportedly unsuccessful. She would like to undergo further retrieval attempts but notes that she has not heard anything back from Dr. Marijean Heath office.     ROS: See HPI  Studies Reviewed: .        Risk Assessment/Calculations:             Physical Exam:   VS:  BP 132/70 (BP Location: Left Arm, Patient Position: Sitting, Cuff Size: Normal)   Pulse 70   Ht 5\' 7"  (1.702 m)   Wt 188 lb 6.4 oz (85.5 kg)   SpO2 94%   BMI 29.51 kg/m    Wt Readings from Last 3 Encounters:  02/09/23 188 lb 6.4 oz (85.5 kg)  01/06/23 187 lb 6.4 oz (85 kg)  12/22/22 184 lb (83.5 kg)    General:  NAD. Neck: No JVD or HJR. Lungs: Clear to auscultation bilaterally without wheezes or crackles. Heart: Regular rate and rhythm without murmurs, rubs, or gallops. Abdomen: Soft, nontender, nondistended. Extremities: No lower extremity edema.  ASSESSMENT AND PLAN: .    Vasovagal syncope and nocturnal bradycardia: No further syncopal episodes noted.  Prior to event monitor showed long nocturnal pauses with subsequent referral for sleep evaluation.  Overnight oxygen saturation testing is currently pending (Ms. Preston reports she returned the device about 1.5 weeks ago).  We will defer additional testing pending review of the oxygen saturation monitoring by her pulmonologist.  I advised Ms. Feather to contact the pulmonary clinic if she does not hear anything by the  Shallen Luedke of this week.  Continue to avoid rate controlling agents including beta-blockers and nondihydropyridine calcium channel blockers.  Hypertension: Blood pressure borderline elevated today.  Continue current regimen of losartan.  IVC filter in situ: Attempted retrieval of IVC filter in May was unsuccessful.  I have encouraged Ms. Grates to reach out to Dr. Marijean Heath office again to discuss further attempts.  If she is unsuccessful or wishes to have a second  opinion, I will refer her to interventional radiology at [facility].    Dispo: Return to clinic in 4 months.  Signed, Yvonne Kendall, MD

## 2023-02-09 ENCOUNTER — Encounter: Payer: Self-pay | Admitting: Internal Medicine

## 2023-02-09 ENCOUNTER — Ambulatory Visit: Payer: HMO | Attending: Cardiology | Admitting: Internal Medicine

## 2023-02-09 ENCOUNTER — Ambulatory Visit: Payer: HMO | Admitting: Internal Medicine

## 2023-02-09 VITALS — BP 132/70 | HR 70 | Ht 67.0 in | Wt 188.4 lb

## 2023-02-09 DIAGNOSIS — Z95828 Presence of other vascular implants and grafts: Secondary | ICD-10-CM | POA: Diagnosis not present

## 2023-02-09 DIAGNOSIS — R001 Bradycardia, unspecified: Secondary | ICD-10-CM | POA: Diagnosis not present

## 2023-02-09 DIAGNOSIS — I10 Essential (primary) hypertension: Secondary | ICD-10-CM

## 2023-02-09 DIAGNOSIS — R55 Syncope and collapse: Secondary | ICD-10-CM | POA: Diagnosis not present

## 2023-02-09 NOTE — Patient Instructions (Signed)
Medication Instructions:  Your physician recommends that you continue on your current medications as directed. Please refer to the Current Medication list given to you today.   *If you need a refill on your cardiac medications before your next appointment, please call your pharmacy*   Lab Work: No labs ordered today    Testing/Procedures: No test ordered today    Follow-Up: At [facility], you and your health needs are our priority.  As part of our continuing mission to provide you with exceptional heart care, we have created designated Provider Care Teams.  These Care Teams include your primary Cardiologist (physician) and Advanced Practice Providers (APPs -  Physician Assistants and Nurse Practitioners) who all work together to provide you with the care you need, when you need it.  We recommend signing up for the patient portal called "MyChart".  Sign up information is provided on this After Visit Summary.  MyChart is used to connect with patients for Virtual Visits (Telemedicine).  Patients are able to view lab/test results, encounter notes, upcoming appointments, etc.  Non-urgent messages can be sent to your provider as well.   To learn more about what you can do with MyChart, go to [email or web address].    Your next appointment:   4 month(s)  Provider:   You may see Yvonne Kendall, MD or one of the following Advanced Practice Providers on your designated Care Team:   Nicolasa Ducking, NP Eula Listen, PA-C Cadence Fransico Michael, PA-C Charlsie Quest, NP

## 2023-02-25 ENCOUNTER — Other Ambulatory Visit: Payer: Self-pay | Admitting: Obstetrics and Gynecology

## 2023-02-25 DIAGNOSIS — Z1231 Encounter for screening mammogram for malignant neoplasm of breast: Secondary | ICD-10-CM

## 2023-02-25 NOTE — Telephone Encounter (Signed)
Can you check on this, please? Thank you!

## 2023-02-26 NOTE — Telephone Encounter (Signed)
I have sent urgent message to [employer] asking about the ONO results and that we don't have them

## 2023-03-03 NOTE — Progress Notes (Signed)
MRN : [id number]  April [name] is a 77 y.o. (Dec 19, 1945) female who presents with chief complaint of history of DVT with IVC filter placed.  History of Present Illness:   Patient returns to the office today for follow-up regarding her IVC filter.  Attempts at removing the IVC filter on May 28 were not successful, the filter is leading posteriorly and the hook appears embedded in the wall of the IVC.    IVC filter was placed July 08, 2022.  On July 21, 2022 she underwent right total knee arthroplasty.  She has done well.  IVC filter was placed prior to her orthopedic surgery given her history of PE which was identified in December 2016 by CT angiogram of the chest and was treated with anticoagulation.  Duplex ultrasound of bilateral lower extremities dated 04/15/2015 was negative for DVT.  The presenting symptoms were shortness of breath and pleuritic chest pain.   She has been doing well.  There is no new swelling of the lower extremities or increased lower extremity pain.  Her orthopedic surgery has healed.  She returns today requesting IVC filter removal.  The patient has not been using compression therapy at this point.   No recent episodes of SOB or pleuritic chest pains.  No cough or hemoptysis.   No blood per rectum or blood in any sputum.  No excessive bruising per the patient.    No recent shortening of the patient's walking distance or new symptoms consistent with claudication.  No history of rest pain symptoms. No new ulcers or wounds of the lower extremities have occurred.  No outpatient medications have been marked as taking for the 03/05/23 encounter (Appointment) with Gilda Crease, Latina Craver, MD.    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  Gerarda Fraction, MD   Pulmonary embolism (HCC) 2016   Pulmonary embolism (HCC) 2016   Syncope    Vertigo 1990   "inner ear"    Past Surgical History:  Procedure Laterality Date   ABDOMINAL HYSTERECTOMY     APPENDECTOMY     BREAST BIOPSY Right 04/30/2020   11:00 5cmfn Qmarker-benign, 11:00 8 cmfn twisted X marker-benign   CARDIAC CATHETERIZATION  2005   CATARACT EXTRACTION W/PHACO Left 02/08/2019   Procedure: CATARACT EXTRACTION PHACO AND INTRAOCULAR LENS PLACEMENT (IOC) LEFT 00:40.0  20.6%  8.26;  Surgeon: Galen Manila, MD;  Location: [facility];  Service: Ophthalmology;  Laterality: Left;   CATARACT EXTRACTION W/PHACO Right 04/26/2019   Procedure: CATARACT EXTRACTION PHACO AND INTRAOCULAR LENS PLACEMENT (IOC) RIGHT 6.83 00:46.0;  Surgeon: Galen Manila, MD;  Location: [facility];  Service: Ophthalmology;  Laterality: Right;   CHOLECYSTECTOMY     CLOSED REDUCTION NASAL FRACTURE Bilateral 11/05/2022   Procedure: CLOSED REDUCTION NASAL FRACTURE;  Surgeon: Geanie Logan, MD;  Location: [facility];  Service: ENT;  Laterality: Bilateral;   COLONOSCOPY  2017   CORONARY ANGIOPLASTY WITH STENT PLACEMENT  FOOT SURGERY Right    IVC FILTER INSERTION N/A 07/08/2022   Procedure: IVC FILTER INSERTION;  Surgeon: Renford Dills, MD;  Location: [facility];  Service: Cardiovascular;  Laterality: N/A;   IVC FILTER REMOVAL N/A 09/23/2022   Procedure: IVC FILTER REMOVAL;  Surgeon: Renford Dills, MD;  Location: [facility];  Service: Cardiovascular;  Laterality: N/A;   TOTAL KNEE ARTHROPLASTY Right 07/21/2022   Procedure: TOTAL KNEE ARTHROPLASTY;  Surgeon: Reinaldo Berber, MD;  Location: [facility];  Service: Orthopedics;  Laterality: Right;    Social History Social History   Tobacco  Use   Smoking status: Never   Smokeless tobacco: Never  Vaping Use   Vaping status: Never Used  Substance Use Topics   Alcohol use: No   Drug use: No    Family History Family History  Problem Relation Age of Onset   Stroke Mother    CAD Father    Colon cancer Other        paternal great grandfather   Breast cancer Neg Hx     Allergies  Allergen Reactions   Antihistamines, Chlorpheniramine-Type Other (See Comments)    "think crazy thoughts"   Atenolol Other (See Comments)    Avoid AVN affecting medications due to bradycardia/syncope   Ciprofloxacin Other (See Comments)    Patient cannot remember reaction   Dristan Cold [Chlorphen-Pe-Acetaminophen] Other (See Comments)    "think crazy thoughts"   Ginger Diarrhea   Nitrofurantoin Nausea Only   Celecoxib Rash     REVIEW OF SYSTEMS (Negative unless checked)  Constitutional: [] Weight loss  [] Fever  [] Chills Cardiac: [] Chest pain   [] Chest pressure   [] Palpitations   [] Shortness of breath when laying flat   [] Shortness of breath with exertion. Vascular:  [] Pain in legs with walking   [x] Pain in legs at rest  [] History of DVT   [] Phlebitis   [x] Swelling in legs   [] Varicose veins   [] Non-healing ulcers Pulmonary:   [] Uses home oxygen   [] Productive cough   [] Hemoptysis   [] Wheeze  [] COPD   [] Asthma Neurologic:  [] Dizziness   [] Seizures   [] History of stroke   [] History of TIA  [] Aphasia   [] Vissual changes   [] Weakness or numbness in arm   [] Weakness or numbness in leg Musculoskeletal:   [] Joint swelling   [] Joint pain   [] Low back pain Hematologic:  [] Easy bruising  [] Easy bleeding   [] Hypercoagulable state   [] Anemic Gastrointestinal:  [] Diarrhea   [] Vomiting  [] Gastroesophageal reflux/heartburn   [] Difficulty swallowing. Genitourinary:  [] Chronic kidney disease   [] Difficult urination  [] Frequent urination   [] Blood in urine Skin:  [] Rashes   [] Ulcers  Psychological:  [] History of anxiety   []  History of major  depression.  Physical Examination  There were no vitals filed for this visit. There is no height or weight on file to calculate BMI. Gen: WD/WN, NAD Head: [place]/AT, No temporalis wasting.  Ear/Nose/Throat: Hearing grossly intact, nares w/o erythema or drainage, pinna without lesions Eyes: PER, EOMI, sclera nonicteric.  Neck: Supple, no gross masses.  No JVD.  Pulmonary:  Good air movement, no audible wheezing, no use of accessory muscles.  Cardiac: RRR, precordium not hyperdynamic. Vascular:  scattered varicosities present bilaterally.  Moderate venous stasis changes to the legs bilaterally.  1-2+ soft pitting edema. CEAP C4sEpAsPr   Vessel Right Left  Radial Palpable Palpable  Gastrointestinal: soft, non-distended. No guarding/no peritoneal signs.  Musculoskeletal: M/S 5/5 throughout.  No deformity.  Neurologic: CN  2-12 intact. Pain and light touch intact in extremities.  Symmetrical.  Speech is fluent. Motor exam as listed above. Psychiatric: Judgment intact, Mood & affect appropriate for pt's clinical situation. Dermatologic: Venous rashes no ulcers noted.  No changes consistent with cellulitis. Lymph : No lichenification or skin changes of chronic lymphedema.  CBC Lab Results  Component Value Date   WBC 4.7 10/24/2022   HGB 10.4 (L) 10/24/2022   HCT 31.7 (L) 10/24/2022   MCV 92.4 10/24/2022   PLT 206 10/24/2022    BMET    Component Value Date/Time   NA 138 12/04/2022 0910   NA 143 12/02/2022 0945   K 3.6 12/04/2022 0910   CL 105 12/04/2022 0910   CO2 23 12/04/2022 0910   GLUCOSE 146 (H) 12/04/2022 0910   BUN 22 12/04/2022 0910   BUN 29 (H) 12/02/2022 0945   CREATININE 0.86 12/04/2022 0910   CALCIUM 9.1 12/04/2022 0910   GFRNONAA >60 12/04/2022 0910   GFRAA >60 04/16/2015 0532   CrCl cannot be calculated (Patient's most recent lab result is older than the maximum 21 days allowed.).  COAG No results found for: "INR", "PROTIME"  Radiology No results  found.   Assessment/Plan 1. History of pulmonary embolus (PE) The patient has done well status post joint replacement surgery.  Therefore, I recommend that we remove the IVC filter.  Given that she has already had 1 attempt and it was identified that the filter is leaning posteriorly we discussed both IJ access as well as femoral access as well as the need for advanced techniques and therefore we will plan for a general surgery with this next procedure.  Risk and benefits were reviewed all questions answered patient has agreed to proceed.  Patient will continue to elevate to minimize swelling.  Given the history of DVT and the possibility of post phlebitic changes such as swelling and discomfort the patient should wear graduated compression stockings.  The compression should be worn on a daily basis.  In addition, behavioral modification including elevation during the day and avoidance of prolonged dependency is helpful.    The patient will follow-up with me after the filter removal.  2. Prothrombin gene mutation [facility]) Patient will continue with her primary regarding any changes in her anticoagulation therapy.  Currently she is taking 81 mg of aspirin daily.  3. Coronary artery disease involving native coronary artery of native heart with angina pectoris (HCC) Continue cardiac and antihypertensive medications as already ordered and reviewed, no changes at this time.  Continue statin as ordered and reviewed, no changes at this time  Nitrates PRN for chest pain  4. Essential hypertension Continue antihypertensive medications as already ordered, these medications have been reviewed and there are no changes at this time.  5. Mixed hyperlipidemia Continue statin as ordered and reviewed, no changes at this time    [name], MD  03/03/2023 6:10 PM

## 2023-03-03 NOTE — H&P (View-Only) (Signed)
 MRN : [id number]  April [name] is a 77 y.o. (Dec 19, 1945) female who presents with chief complaint of history of DVT with IVC filter placed.  History of Present Illness:   Patient returns to the office today for follow-up regarding her IVC filter.  Attempts at removing the IVC filter on May 28 were not successful, the filter is leading posteriorly and the hook appears embedded in the wall of the IVC.    IVC filter was placed July 08, 2022.  On July 21, 2022 she underwent right total knee arthroplasty.  She has done well.  IVC filter was placed prior to her orthopedic surgery given her history of PE which was identified in December 2016 by CT angiogram of the chest and was treated with anticoagulation.  Duplex ultrasound of bilateral lower extremities dated 04/15/2015 was negative for DVT.  The presenting symptoms were shortness of breath and pleuritic chest pain.   She has been doing well.  There is no new swelling of the lower extremities or increased lower extremity pain.  Her orthopedic surgery has healed.  She returns today requesting IVC filter removal.  The patient has not been using compression therapy at this point.   No recent episodes of SOB or pleuritic chest pains.  No cough or hemoptysis.   No blood per rectum or blood in any sputum.  No excessive bruising per the patient.    No recent shortening of the patient's walking distance or new symptoms consistent with claudication.  No history of rest pain symptoms. No new ulcers or wounds of the lower extremities have occurred.  No outpatient medications have been marked as taking for the 03/05/23 encounter (Appointment) with Gilda Crease, Latina Craver, MD.    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  Gerarda Fraction, MD   Pulmonary embolism (HCC) 2016   Pulmonary embolism (HCC) 2016   Syncope    Vertigo 1990   "inner ear"    Past Surgical History:  Procedure Laterality Date   ABDOMINAL HYSTERECTOMY     APPENDECTOMY     BREAST BIOPSY Right 04/30/2020   11:00 5cmfn Qmarker-benign, 11:00 8 cmfn twisted X marker-benign   CARDIAC CATHETERIZATION  2005   CATARACT EXTRACTION W/PHACO Left 02/08/2019   Procedure: CATARACT EXTRACTION PHACO AND INTRAOCULAR LENS PLACEMENT (IOC) LEFT 00:40.0  20.6%  8.26;  Surgeon: Galen Manila, MD;  Location: [facility];  Service: Ophthalmology;  Laterality: Left;   CATARACT EXTRACTION W/PHACO Right 04/26/2019   Procedure: CATARACT EXTRACTION PHACO AND INTRAOCULAR LENS PLACEMENT (IOC) RIGHT 6.83 00:46.0;  Surgeon: Galen Manila, MD;  Location: [facility];  Service: Ophthalmology;  Laterality: Right;   CHOLECYSTECTOMY     CLOSED REDUCTION NASAL FRACTURE Bilateral 11/05/2022   Procedure: CLOSED REDUCTION NASAL FRACTURE;  Surgeon: Geanie Logan, MD;  Location: [facility];  Service: ENT;  Laterality: Bilateral;   COLONOSCOPY  2017   CORONARY ANGIOPLASTY WITH STENT PLACEMENT  FOOT SURGERY Right    IVC FILTER INSERTION N/A 07/08/2022   Procedure: IVC FILTER INSERTION;  Surgeon: Renford Dills, MD;  Location: [facility];  Service: Cardiovascular;  Laterality: N/A;   IVC FILTER REMOVAL N/A 09/23/2022   Procedure: IVC FILTER REMOVAL;  Surgeon: Renford Dills, MD;  Location: [facility];  Service: Cardiovascular;  Laterality: N/A;   TOTAL KNEE ARTHROPLASTY Right 07/21/2022   Procedure: TOTAL KNEE ARTHROPLASTY;  Surgeon: Reinaldo Berber, MD;  Location: [facility];  Service: Orthopedics;  Laterality: Right;    Social History Social History   Tobacco  Use   Smoking status: Never   Smokeless tobacco: Never  Vaping Use   Vaping status: Never Used  Substance Use Topics   Alcohol use: No   Drug use: No    Family History Family History  Problem Relation Age of Onset   Stroke Mother    CAD Father    Colon cancer Other        paternal great grandfather   Breast cancer Neg Hx     Allergies  Allergen Reactions   Antihistamines, Chlorpheniramine-Type Other (See Comments)    "think crazy thoughts"   Atenolol Other (See Comments)    Avoid AVN affecting medications due to bradycardia/syncope   Ciprofloxacin Other (See Comments)    Patient cannot remember reaction   Dristan Cold [Chlorphen-Pe-Acetaminophen] Other (See Comments)    "think crazy thoughts"   Ginger Diarrhea   Nitrofurantoin Nausea Only   Celecoxib Rash     REVIEW OF SYSTEMS (Negative unless checked)  Constitutional: [] Weight loss  [] Fever  [] Chills Cardiac: [] Chest pain   [] Chest pressure   [] Palpitations   [] Shortness of breath when laying flat   [] Shortness of breath with exertion. Vascular:  [] Pain in legs with walking   [x] Pain in legs at rest  [] History of DVT   [] Phlebitis   [x] Swelling in legs   [] Varicose veins   [] Non-healing ulcers Pulmonary:   [] Uses home oxygen   [] Productive cough   [] Hemoptysis   [] Wheeze  [] COPD   [] Asthma Neurologic:  [] Dizziness   [] Seizures   [] History of stroke   [] History of TIA  [] Aphasia   [] Vissual changes   [] Weakness or numbness in arm   [] Weakness or numbness in leg Musculoskeletal:   [] Joint swelling   [] Joint pain   [] Low back pain Hematologic:  [] Easy bruising  [] Easy bleeding   [] Hypercoagulable state   [] Anemic Gastrointestinal:  [] Diarrhea   [] Vomiting  [] Gastroesophageal reflux/heartburn   [] Difficulty swallowing. Genitourinary:  [] Chronic kidney disease   [] Difficult urination  [] Frequent urination   [] Blood in urine Skin:  [] Rashes   [] Ulcers  Psychological:  [] History of anxiety   []  History of major  depression.  Physical Examination  There were no vitals filed for this visit. There is no height or weight on file to calculate BMI. Gen: WD/WN, NAD Head: [place]/AT, No temporalis wasting.  Ear/Nose/Throat: Hearing grossly intact, nares w/o erythema or drainage, pinna without lesions Eyes: PER, EOMI, sclera nonicteric.  Neck: Supple, no gross masses.  No JVD.  Pulmonary:  Good air movement, no audible wheezing, no use of accessory muscles.  Cardiac: RRR, precordium not hyperdynamic. Vascular:  scattered varicosities present bilaterally.  Moderate venous stasis changes to the legs bilaterally.  1-2+ soft pitting edema. CEAP C4sEpAsPr   Vessel Right Left  Radial Palpable Palpable  Gastrointestinal: soft, non-distended. No guarding/no peritoneal signs.  Musculoskeletal: M/S 5/5 throughout.  No deformity.  Neurologic: CN  2-12 intact. Pain and light touch intact in extremities.  Symmetrical.  Speech is fluent. Motor exam as listed above. Psychiatric: Judgment intact, Mood & affect appropriate for pt's clinical situation. Dermatologic: Venous rashes no ulcers noted.  No changes consistent with cellulitis. Lymph : No lichenification or skin changes of chronic lymphedema.  CBC Lab Results  Component Value Date   WBC 4.7 10/24/2022   HGB 10.4 (L) 10/24/2022   HCT 31.7 (L) 10/24/2022   MCV 92.4 10/24/2022   PLT 206 10/24/2022    BMET    Component Value Date/Time   NA 138 12/04/2022 0910   NA 143 12/02/2022 0945   K 3.6 12/04/2022 0910   CL 105 12/04/2022 0910   CO2 23 12/04/2022 0910   GLUCOSE 146 (H) 12/04/2022 0910   BUN 22 12/04/2022 0910   BUN 29 (H) 12/02/2022 0945   CREATININE 0.86 12/04/2022 0910   CALCIUM 9.1 12/04/2022 0910   GFRNONAA >60 12/04/2022 0910   GFRAA >60 04/16/2015 0532   CrCl cannot be calculated (Patient's most recent lab result is older than the maximum 21 days allowed.).  COAG No results found for: "INR", "PROTIME"  Radiology No results  found.   Assessment/Plan 1. History of pulmonary embolus (PE) The patient has done well status post joint replacement surgery.  Therefore, I recommend that we remove the IVC filter.  Given that she has already had 1 attempt and it was identified that the filter is leaning posteriorly we discussed both IJ access as well as femoral access as well as the need for advanced techniques and therefore we will plan for a general surgery with this next procedure.  Risk and benefits were reviewed all questions answered patient has agreed to proceed.  Patient will continue to elevate to minimize swelling.  Given the history of DVT and the possibility of post phlebitic changes such as swelling and discomfort the patient should wear graduated compression stockings.  The compression should be worn on a daily basis.  In addition, behavioral modification including elevation during the day and avoidance of prolonged dependency is helpful.    The patient will follow-up with me after the filter removal.  2. Prothrombin gene mutation [facility]) Patient will continue with her primary regarding any changes in her anticoagulation therapy.  Currently she is taking 81 mg of aspirin daily.  3. Coronary artery disease involving native coronary artery of native heart with angina pectoris (HCC) Continue cardiac and antihypertensive medications as already ordered and reviewed, no changes at this time.  Continue statin as ordered and reviewed, no changes at this time  Nitrates PRN for chest pain  4. Essential hypertension Continue antihypertensive medications as already ordered, these medications have been reviewed and there are no changes at this time.  5. Mixed hyperlipidemia Continue statin as ordered and reviewed, no changes at this time    [name], MD  03/03/2023 6:10 PM

## 2023-03-05 ENCOUNTER — Ambulatory Visit (INDEPENDENT_AMBULATORY_CARE_PROVIDER_SITE_OTHER): Payer: HMO | Admitting: Vascular Surgery

## 2023-03-05 VITALS — BP 129/78 | HR 70 | Resp 16 | Wt 189.4 lb

## 2023-03-05 DIAGNOSIS — D6852 Prothrombin gene mutation: Secondary | ICD-10-CM | POA: Diagnosis not present

## 2023-03-05 DIAGNOSIS — Z86711 Personal history of pulmonary embolism: Secondary | ICD-10-CM | POA: Diagnosis not present

## 2023-03-05 DIAGNOSIS — E782 Mixed hyperlipidemia: Secondary | ICD-10-CM

## 2023-03-05 DIAGNOSIS — I25119 Atherosclerotic heart disease of native coronary artery with unspecified angina pectoris: Secondary | ICD-10-CM

## 2023-03-05 DIAGNOSIS — I10 Essential (primary) hypertension: Secondary | ICD-10-CM | POA: Diagnosis not present

## 2023-03-06 ENCOUNTER — Telehealth (INDEPENDENT_AMBULATORY_CARE_PROVIDER_SITE_OTHER): Payer: Self-pay

## 2023-03-06 NOTE — Telephone Encounter (Signed)
Patient called back and is scheduled on 03/17/23 with a 1:00 pm arrival time to the [facility] with Dr. Gilda Crease. Pre-procedure instructions were discussed and will be sent to Mychart and mailed.

## 2023-03-06 NOTE — Telephone Encounter (Signed)
It was suppose to have been faxed to our fax number on 02/26/23 but if it was received it would have been faxed to [facility]. I have sent another message asking them to refax and put attn Synetta Fail on it

## 2023-03-06 NOTE — Telephone Encounter (Signed)
I attempted to contact the patient to schedule a IVC filter removal with Dr. Gilda Crease. A message was left for a return call.

## 2023-03-08 ENCOUNTER — Encounter (INDEPENDENT_AMBULATORY_CARE_PROVIDER_SITE_OTHER): Payer: Self-pay | Admitting: Vascular Surgery

## 2023-03-11 NOTE — Telephone Encounter (Signed)
Overnight oximetry test on room air showed SpO2 low 88%.  Baseline oxygen was 95%.  She spent 20 seconds with an oxygen level less than 88%.  She does not need supplemental/nocturnal oxygen.

## 2023-03-17 ENCOUNTER — Ambulatory Visit: Payer: HMO | Admitting: Certified Registered Nurse Anesthetist

## 2023-03-17 ENCOUNTER — Encounter
Admission: RE | Disposition: A | Discharge status: Home / Self Care | Payer: Self-pay | Source: Home / Self Care | Attending: Vascular Surgery

## 2023-03-17 ENCOUNTER — Other Ambulatory Visit: Payer: Self-pay

## 2023-03-17 ENCOUNTER — Ambulatory Visit
Admission: RE | Admit: 2023-03-17 | Discharge: 2023-03-17 | Disposition: A | Discharge status: Home / Self Care | Payer: HMO | Attending: Vascular Surgery | Admitting: Vascular Surgery

## 2023-03-17 ENCOUNTER — Encounter: Payer: Self-pay | Admitting: Vascular Surgery

## 2023-03-17 DIAGNOSIS — I82409 Acute embolism and thrombosis of unspecified deep veins of unspecified lower extremity: Secondary | ICD-10-CM | POA: Diagnosis present

## 2023-03-17 DIAGNOSIS — Z539 Procedure and treatment not carried out, unspecified reason: Secondary | ICD-10-CM | POA: Diagnosis not present

## 2023-03-17 SURGERY — IVC FILTER REMOVAL
Anesthesia: General

## 2023-03-17 MED ORDER — HYDROMORPHONE HCL 1 MG/ML IJ SOLN: 1.0000 mg | Freq: Once | INTRAMUSCULAR | Status: DC | PRN

## 2023-03-17 MED ORDER — DIPHENHYDRAMINE HCL 50 MG/ML IJ SOLN: 50.0000 mg | Freq: Once | INTRAMUSCULAR | Status: DC | PRN

## 2023-03-17 MED ORDER — CEFAZOLIN SODIUM-DEXTROSE 2-4 GM/100ML-% IV SOLN: 2.0000 g | INTRAVENOUS | Status: DC

## 2023-03-17 MED ORDER — SODIUM CHLORIDE 0.9 % IV SOLN: INTRAVENOUS | Status: DC

## 2023-03-17 MED ORDER — METHYLPREDNISOLONE SODIUM SUCC 125 MG IJ SOLR: 125.0000 mg | Freq: Once | INTRAMUSCULAR | Status: DC | PRN

## 2023-03-17 MED ORDER — ONDANSETRON HCL 4 MG/2ML IJ SOLN: 4.0000 mg | Freq: Four times a day (QID) | INTRAMUSCULAR | Status: DC | PRN

## 2023-03-17 MED ORDER — CEFAZOLIN SODIUM-DEXTROSE 2-4 GM/100ML-% IV SOLN
INTRAVENOUS | Status: AC
  Filled 2023-03-17: qty 100

## 2023-03-17 MED ORDER — FAMOTIDINE 20 MG PO TABS: 40.0000 mg | ORAL_TABLET | Freq: Once | ORAL | Status: DC | PRN

## 2023-03-17 MED ORDER — MIDAZOLAM HCL 2 MG/ML PO SYRP: 8.0000 mg | ORAL_SOLUTION | Freq: Once | ORAL | Status: DC | PRN

## 2023-03-17 NOTE — Anesthesia Preprocedure Evaluation (Signed)
Anesthesia Evaluation  Patient identified by MRN, date of birth, ID band Patient awake    Reviewed: Allergy & Precautions, NPO status , Patient's Chart, lab work & pertinent test results  History of Anesthesia Complications Negative for: history of anesthetic complications  Airway Mallampati: III  TM Distance: <3 FB Neck ROM: full    Dental  (+) Chipped, Implants   Pulmonary neg pulmonary ROS, neg shortness of breath   Pulmonary exam normal        Cardiovascular Exercise Tolerance: Good hypertension, (-) angina + CAD and + Past MI  Normal cardiovascular exam     Neuro/Psych negative neurological ROS  negative psych ROS   GI/Hepatic negative GI ROS, Neg liver ROS,neg GERD  ,,  Endo/Other  negative endocrine ROS    Renal/GU negative Renal ROS  negative genitourinary   Musculoskeletal   Abdominal   Peds  Hematology negative hematology ROS (+)   Anesthesia Other Findings Past Medical History: No date: Anemia No date: Arthritis No date: Cardiac abnormality     Comment:  STENT No date: Cataract No date: Coronary artery disease No date: Diverticula of colon No date: Endometriosis No date: History of kidney stones No date: History of shingles 2017: Hx of blood clots     Comment:  [facility] No date: Hypercholesterolemia No date: Hypertension No date: Myocardial infarction [facility])     Comment:  2006 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, 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  Gerarda Fraction, MD 2016: Pulmonary embolism (HCC) 2016: Pulmonary embolism (HCC) No date: Syncope 1990: Vertigo     Comment:  "inner ear"  Past Surgical History: No date: ABDOMINAL HYSTERECTOMY No date: APPENDECTOMY 04/30/2020: BREAST BIOPSY; Right     Comment:  11:00 5cmfn Qmarker-benign, 11:00 8 cmfn twisted X               marker-benign 2005: CARDIAC CATHETERIZATION 02/08/2019: CATARACT EXTRACTION  W/PHACO; Left     Comment:  Procedure: CATARACT EXTRACTION PHACO AND INTRAOCULAR               LENS PLACEMENT (IOC) LEFT 00:40.0  20.6%  8.26;  Surgeon:              Galen Manila, MD;  Location: [facility];                Service: Ophthalmology;  Laterality: Left; 04/26/2019: CATARACT EXTRACTION W/PHACO; Right     Comment:  Procedure: CATARACT EXTRACTION PHACO AND INTRAOCULAR               LENS PLACEMENT (IOC) RIGHT 6.83 00:46.0;  Surgeon:               Galen Manila, MD;  Location: [facility];                Service: Ophthalmology;  Laterality: Right; No date: CHOLECYSTECTOMY 11/05/2022: CLOSED REDUCTION NASAL FRACTURE; Bilateral     Comment:  Procedure: CLOSED REDUCTION NASAL FRACTURE;  Surgeon:               Geanie Logan, MD;  Location: [facility] ORS;  Service: ENT;                Laterality: Bilateral; 2017: COLONOSCOPY No date: CORONARY ANGIOPLASTY WITH STENT PLACEMENT No date: FOOT SURGERY; Right 07/08/2022: IVC FILTER INSERTION; N/A     Comment:  Procedure: IVC FILTER INSERTION;  Surgeon: Renford Dills, MD;  Location: [facility] INVASIVE CV LAB;  Service:              Cardiovascular;  Laterality: N/A; 09/23/2022: IVC FILTER REMOVAL; N/A     Comment:  Procedure: IVC FILTER REMOVAL;  Surgeon: Renford Dills, MD;  Location: [facility] INVASIVE CV LAB;  Service:              Cardiovascular;  Laterality: N/A; 07/21/2022: TOTAL KNEE ARTHROPLASTY; Right     Comment:  Procedure: TOTAL KNEE ARTHROPLASTY;  Surgeon: Reinaldo Berber, MD;  Location: [facility] ORS;  Service: Orthopedics;               Laterality: Right;  BMI    Body Mass Index: 29.27 kg/m      Reproductive/Obstetrics negative OB ROS                             Anesthesia Physical Anesthesia Plan  ASA: 3  Anesthesia Plan: General   Post-op Pain Management:    Induction: Intravenous  PONV Risk Score and Plan: Propofol infusion  and TIVA  Airway Management Planned: Natural Airway and Nasal Cannula  Additional Equipment:   Intra-op Plan:   Post-operative Plan:   Informed Consent: I have reviewed the patients History and Physical, chart, labs and discussed the procedure including the risks, benefits and alternatives for the proposed anesthesia with the patient or authorized representative who has indicated his/her understanding and acceptance.     Dental Advisory Given  Plan Discussed with: Anesthesiologist, CRNA and Surgeon  Anesthesia Plan Comments: (Patient consented for risks of anesthesia including but not limited to:  - adverse reactions to medications - risk of airway placement if required - damage to eyes, teeth, lips or other oral mucosa - nerve damage due to positioning  - sore throat or hoarseness - Damage to heart, brain, nerves, lungs, other parts of body or loss of life  Patient voiced understanding and assent.)       Anesthesia Quick Evaluation

## 2023-03-23 ENCOUNTER — Telehealth (INDEPENDENT_AMBULATORY_CARE_PROVIDER_SITE_OTHER): Payer: Self-pay

## 2023-03-23 NOTE — Telephone Encounter (Signed)
Called the patient and she has been scheduled to have a IVC filter removal with Dr. Gilda Crease on 03/31/23 with a 12:30 pm arrival time to the [facility]. Pre-procedure instructions were discussed and will be sent to Mychart.

## 2023-03-23 NOTE — Progress Notes (Deleted)
Cardiology Office Note Date:  03/23/2023  Patient ID:  April [name], April [name] 29-Apr-1945, MRN [id number] PCP:  Marina Goodell, MD  Cardiologist:  Yvonne Kendall, MD (has seen inpatient only) Electrophysiologist: None    Chief Complaint: nocturnal sinus pauses   History of Present Illness: April [name] is a 77 y.o. female with PMH notable for CAD s/p PCI (2006), HTN, prior provoked PE s/p IVC filter with unsuccessful removal, syncope; seen today routine follow-up.  The patient was eval'd in the hospital by cardiology team 09/2022 after syncopizing. Prior to syncope, she had prodrome of nausea - this was second syncope event. Ambulatory monitor at discharge showed significant nocturnal pauses up to 13 seconds. I saw her in follow-up about a month ago and recommended a referral to pulm for sleep study, and to stop her atenolol. She presented to ER 12/03/2022 after feeling nauseated at night and feeling as though she would pass out. Zio live monitor applied at discharge, has not resulted.   On follow-up today, her main complaint is feeling heart beating hard in neck/ear. Denies chest pain, chest pressure. Has had no further feelings of nausea or pre-syncope at night. She is pending pulm eval, scheduled in September for OSA eval.   BP readings at home are 120-140 systolics, HRs in 70-80s.    Past Medical History:  Diagnosis Date   Anemia    Arthritis    Cardiac abnormality    STENT   Cataract    Coronary artery disease    Diverticula of colon    Endometriosis    History of kidney stones    History of shingles    Hx of blood clots 2017   [facility]   Hypercholesterolemia    Hypertension    Myocardial infarction 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  Gerarda Fraction, MD   Pulmonary embolism (HCC) 2016   Pulmonary embolism (HCC) 2016   Syncope    Vertigo 1990   "inner ear"    Past Surgical History:  Procedure  Laterality Date   ABDOMINAL HYSTERECTOMY     APPENDECTOMY     BREAST BIOPSY Right 04/30/2020   11:00 5cmfn Qmarker-benign, 11:00 8 cmfn twisted X marker-benign   CARDIAC CATHETERIZATION  2005   CATARACT EXTRACTION W/PHACO Left 02/08/2019   Procedure: CATARACT EXTRACTION PHACO AND INTRAOCULAR LENS PLACEMENT (IOC) LEFT 00:40.0  20.6%  8.26;  Surgeon: Galen Manila, MD;  Location: [facility];  Service: Ophthalmology;  Laterality: Left;   CATARACT EXTRACTION W/PHACO Right 04/26/2019   Procedure: CATARACT EXTRACTION PHACO AND INTRAOCULAR LENS PLACEMENT (IOC) RIGHT 6.83 00:46.0;  Surgeon: Galen Manila, MD;  Location: [facility];  Service: Ophthalmology;  Laterality: Right;   CHOLECYSTECTOMY     CLOSED REDUCTION NASAL FRACTURE Bilateral 11/05/2022   Procedure: CLOSED REDUCTION NASAL FRACTURE;  Surgeon: Geanie Logan, MD;  Location: [facility] ORS;  Service: ENT;  Laterality: Bilateral;   COLONOSCOPY  2017   CORONARY ANGIOPLASTY WITH STENT PLACEMENT     FOOT SURGERY Right    IVC FILTER INSERTION N/A 07/08/2022   Procedure: IVC FILTER INSERTION;  Surgeon: Renford Dills, MD;  Location: [facility] INVASIVE CV LAB;  Service: Cardiovascular;  Laterality: N/A;   IVC FILTER REMOVAL N/A 09/23/2022   Procedure: IVC FILTER REMOVAL;  Surgeon: Renford Dills, MD;  Location: [facility] INVASIVE CV LAB;  Service: Cardiovascular;  Laterality: N/A;   TOTAL KNEE ARTHROPLASTY Right 07/21/2022  Procedure: TOTAL KNEE ARTHROPLASTY;  Surgeon: Reinaldo Berber, MD;  Location: [facility] ORS;  Service: Orthopedics;  Laterality: Right;    Current Outpatient Medications  Medication Instructions   acetaminophen (TYLENOL) 1,000 mg, Oral, Every 8 hours   aspirin EC 81 mg, Oral, Daily   atorvastatin (LIPITOR) 40 mg, Oral, Daily   calcium citrate-vitamin D (CITRACAL+D) 315-200 MG-UNIT tablet 1 tablet, Oral, Daily   losartan (COZAAR) 25 mg, Oral, Daily   Multiple Vitamin (MULTIVITAMIN WITH MINERALS) TABS tablet 1  tablet, Oral, 2 times weekly   ondansetron (ZOFRAN) 4 mg, Oral, Daily PRN   Vitamin D3 2,000 Units, Oral, Daily    Social History:  The patient  reports that she has never smoked. She has never used smokeless tobacco. She reports that she does not drink alcohol and does not use drugs.   Family History:  The patient's family history includes CAD in her father; Colon cancer in an other family member; Stroke in her mother.  ROS:  Please see the history of present illness. All other systems are reviewed and otherwise negative.   PHYSICAL EXAM:  VS:  There were no vitals taken for this visit. BMI: There is no height or weight on file to calculate BMI.  GEN- The patient is well appearing, alert and oriented x 3 today.   Lungs- Clear to ausculation bilaterally, normal work of breathing.  Heart- Regular rate and rhythm, no murmurs, rubs or gallops Extremities- Trace peripheral edema, warm, dry   EKG is ordered. Personal review of EKG from today shows:         Recent Labs: 10/23/2022: ALT 15 10/24/2022: Hemoglobin 10.4; Platelets 206 12/03/2022: TSH 1.581 12/04/2022: BUN 22; Creatinine, Ser 0.86; Magnesium 2.1; Potassium 3.6; Sodium 138  No results found for requested labs within last 365 days.   CrCl cannot be calculated (Patient's most recent lab result is older than the maximum 21 days allowed.).   Wt Readings from Last 3 Encounters:  03/17/23 186 lb 14.4 oz (84.8 kg)  03/05/23 189 lb 6.4 oz (85.9 kg)  02/09/23 188 lb 6.4 oz (85.5 kg)     Additional studies reviewed include: Previous EP, cardiology notes.   Long Term monitor, in-progress 12/04/22 -  On my review, no significant pauses One NSVT episode, 4 beats in duration  Long term monitor, 11/18/2022 Normal sinus rhythm Patient had a min HR of 37 bpm, max HR of 193 bpm, and avg HR of 62 bpm.   37 Supraventricular Tachycardia runs occurred, the run with the fastest interval lasting 7 beats with a max rate of 193 bpm, the longest  lasting 10.9 secs with an avg rate of 120 bpm.   3 Pauses occurred, the longest lasting 13.1 secs (5 bpm).  Noted at 3 AM  Isolated SVEs were rare (<1.0%), SVE Couplets were rare (<1.0%), and SVE Triplets were rare (<1.0%).  Isolated VEs were rare (<1.0%), and no VE Couplets or VE Triplets were present. Ventricular Bigeminy was present.   Patient triggered events associated with normal sinus rhythm  TTE, 10/24/2022  1. Left ventricular ejection fraction, by estimation, is 65 to 70%. The left ventricle has normal function. The left ventricle has no regional wall motion abnormalities. Left ventricular diastolic parameters were normal. The average left ventricular global longitudinal strain is -18.4 %. The global longitudinal strain is normal.   2. Right ventricular systolic function is normal. The right ventricular size is normal. Tricuspid regurgitation signal is inadequate for assessing PA pressure.   3. The  mitral valve is normal in structure. Trivial mitral valve regurgitation. No evidence of mitral stenosis.   4. The aortic valve is tricuspid. Aortic valve regurgitation is mild. No aortic stenosis is present.   5. Pulmonic valve regurgitation is moderate.   ASSESSMENT AND PLAN:  #) nocturnal bradycardia #) concern for OSA Significant pauses overnight on 7/23 ambulatory monitor Has since stopped atenolol, most recent monitor without nocturnal pauses Pending pulm evaluation, currently scheduled for September Avoid all AVN medications  #) syncope No further episodes of syncope  #) HTN Avoid AVN blocking medications d/t nocturnal pauses as above Continue 25mg  losartan    Current medicines are reviewed at length with the patient today.   The patient does not have concerns regarding her medicines.  The following changes were made today:   none  Labs/ tests ordered today include:  No orders of the defined types were placed in this encounter.    Disposition: Follow up with EP APP in  3 months   Signed, Sherie Don, NP  03/23/23  12:55 PM  Electrophysiology [facility]

## 2023-03-24 ENCOUNTER — Ambulatory Visit: Payer: HMO | Admitting: Cardiology

## 2023-03-31 ENCOUNTER — Encounter
Admission: RE | Disposition: A | Discharge status: Home / Self Care | Payer: Self-pay | Source: Home / Self Care | Attending: Vascular Surgery

## 2023-03-31 ENCOUNTER — Ambulatory Visit: Payer: HMO | Admitting: Certified Registered Nurse Anesthetist

## 2023-03-31 ENCOUNTER — Ambulatory Visit
Admission: RE | Admit: 2023-03-31 | Discharge: 2023-03-31 | Disposition: A | Discharge status: Home / Self Care | Payer: HMO | Attending: Vascular Surgery | Admitting: Vascular Surgery

## 2023-03-31 ENCOUNTER — Encounter: Payer: Self-pay | Admitting: Vascular Surgery

## 2023-03-31 ENCOUNTER — Other Ambulatory Visit: Payer: Self-pay

## 2023-03-31 DIAGNOSIS — Z96651 Presence of right artificial knee joint: Secondary | ICD-10-CM | POA: Insufficient documentation

## 2023-03-31 DIAGNOSIS — I25119 Atherosclerotic heart disease of native coronary artery with unspecified angina pectoris: Secondary | ICD-10-CM | POA: Diagnosis not present

## 2023-03-31 DIAGNOSIS — Z7982 Long term (current) use of aspirin: Secondary | ICD-10-CM | POA: Diagnosis not present

## 2023-03-31 DIAGNOSIS — I252 Old myocardial infarction: Secondary | ICD-10-CM | POA: Insufficient documentation

## 2023-03-31 DIAGNOSIS — Z452 Encounter for adjustment and management of vascular access device: Secondary | ICD-10-CM | POA: Diagnosis not present

## 2023-03-31 DIAGNOSIS — E782 Mixed hyperlipidemia: Secondary | ICD-10-CM | POA: Diagnosis not present

## 2023-03-31 DIAGNOSIS — I10 Essential (primary) hypertension: Secondary | ICD-10-CM | POA: Insufficient documentation

## 2023-03-31 DIAGNOSIS — Z4589 Encounter for adjustment and management of other implanted devices: Secondary | ICD-10-CM | POA: Insufficient documentation

## 2023-03-31 DIAGNOSIS — D6852 Prothrombin gene mutation: Secondary | ICD-10-CM | POA: Insufficient documentation

## 2023-03-31 DIAGNOSIS — I82409 Acute embolism and thrombosis of unspecified deep veins of unspecified lower extremity: Secondary | ICD-10-CM

## 2023-03-31 DIAGNOSIS — Z8249 Family history of ischemic heart disease and other diseases of the circulatory system: Secondary | ICD-10-CM | POA: Insufficient documentation

## 2023-03-31 DIAGNOSIS — Z86711 Personal history of pulmonary embolism: Secondary | ICD-10-CM

## 2023-03-31 DIAGNOSIS — Z95828 Presence of other vascular implants and grafts: Secondary | ICD-10-CM

## 2023-03-31 DIAGNOSIS — Z86718 Personal history of other venous thrombosis and embolism: Secondary | ICD-10-CM | POA: Diagnosis not present

## 2023-03-31 DIAGNOSIS — Z7901 Long term (current) use of anticoagulants: Secondary | ICD-10-CM | POA: Diagnosis not present

## 2023-03-31 DIAGNOSIS — Z9889 Other specified postprocedural states: Secondary | ICD-10-CM

## 2023-03-31 HISTORY — PX: IVC FILTER REMOVAL: CATH118246

## 2023-03-31 SURGERY — IVC FILTER REMOVAL
Anesthesia: General

## 2023-03-31 MED ORDER — FENTANYL CITRATE (PF) 100 MCG/2ML IJ SOLN
INTRAMUSCULAR | Status: DC | PRN
  Administered 2023-03-31: 12.5 ug via INTRAVENOUS

## 2023-03-31 MED ORDER — PHENYLEPHRINE 80 MCG/ML (10ML) SYRINGE FOR IV PUSH (FOR BLOOD PRESSURE SUPPORT)
PREFILLED_SYRINGE | INTRAVENOUS | Status: AC
Start: 2023-03-31 — End: ?
  Filled 2023-03-31: qty 10

## 2023-03-31 MED ORDER — OXYCODONE HCL 5 MG/5ML PO SOLN: 5.0000 mg | Freq: Once | ORAL | Status: DC | PRN

## 2023-03-31 MED ORDER — CEFAZOLIN SODIUM-DEXTROSE 2-4 GM/100ML-% IV SOLN
2.0000 g | INTRAVENOUS | Status: AC
Start: 2023-03-31 — End: 2023-03-31
  Administered 2023-03-31: 2 g via INTRAVENOUS

## 2023-03-31 MED ORDER — HYDROMORPHONE HCL 1 MG/ML IJ SOLN: 1.0000 mg | Freq: Once | INTRAMUSCULAR | Status: DC | PRN

## 2023-03-31 MED ORDER — DEXAMETHASONE SODIUM PHOSPHATE 10 MG/ML IJ SOLN
INTRAMUSCULAR | Status: DC | PRN
  Administered 2023-03-31: 10 mg via INTRAVENOUS

## 2023-03-31 MED ORDER — OXYCODONE HCL 5 MG PO TABS: 5.0000 mg | ORAL_TABLET | Freq: Once | ORAL | Status: DC | PRN

## 2023-03-31 MED ORDER — FENTANYL CITRATE (PF) 100 MCG/2ML IJ SOLN
25.0000 ug | INTRAMUSCULAR | Status: DC | PRN
Start: 2023-03-31 — End: 2023-03-31

## 2023-03-31 MED ORDER — GLYCOPYRROLATE 0.2 MG/ML IJ SOLN
INTRAMUSCULAR | Status: DC | PRN
  Administered 2023-03-31: .2 mg via INTRAVENOUS

## 2023-03-31 MED ORDER — HEPARIN (PORCINE) IN NACL 1000-0.9 UT/500ML-% IV SOLN
INTRAVENOUS | Status: DC | PRN
  Administered 2023-03-31: 1000 mL

## 2023-03-31 MED ORDER — DEXMEDETOMIDINE HCL IN NACL 80 MCG/20ML IV SOLN
INTRAVENOUS | Status: AC
  Filled 2023-03-31: qty 20

## 2023-03-31 MED ORDER — CEFAZOLIN SODIUM-DEXTROSE 2-4 GM/100ML-% IV SOLN
INTRAVENOUS | Status: AC
Start: 2023-03-31 — End: ?
  Filled 2023-03-31: qty 100

## 2023-03-31 MED ORDER — DIPHENHYDRAMINE HCL 50 MG/ML IJ SOLN: 50.0000 mg | Freq: Once | INTRAMUSCULAR | Status: DC | PRN

## 2023-03-31 MED ORDER — FAMOTIDINE 20 MG PO TABS: 40.0000 mg | ORAL_TABLET | Freq: Once | ORAL | Status: DC | PRN

## 2023-03-31 MED ORDER — LIDOCAINE-EPINEPHRINE (PF) 1 %-1:200000 IJ SOLN
INTRAMUSCULAR | Status: DC | PRN
  Administered 2023-03-31: 10 mL

## 2023-03-31 MED ORDER — LACTATED RINGERS IV SOLN: INTRAVENOUS | Status: DC

## 2023-03-31 MED ORDER — EPHEDRINE 5 MG/ML INJ
INTRAVENOUS | Status: AC
Start: 2023-03-31 — End: ?
  Filled 2023-03-31: qty 5

## 2023-03-31 MED ORDER — METHYLPREDNISOLONE SODIUM SUCC 125 MG IJ SOLR: 125.0000 mg | Freq: Once | INTRAMUSCULAR | Status: DC | PRN

## 2023-03-31 MED ORDER — IODIXANOL 320 MG/ML IV SOLN
INTRAVENOUS | Status: DC | PRN
  Administered 2023-03-31: 25 mL

## 2023-03-31 MED ORDER — EPHEDRINE SULFATE-NACL 50-0.9 MG/10ML-% IV SOSY
PREFILLED_SYRINGE | INTRAVENOUS | Status: DC | PRN
  Administered 2023-03-31 (×3): 5 mg via INTRAVENOUS

## 2023-03-31 MED ORDER — FENTANYL CITRATE (PF) 100 MCG/2ML IJ SOLN
INTRAMUSCULAR | Status: AC
  Filled 2023-03-31: qty 2

## 2023-03-31 MED ORDER — SODIUM CHLORIDE 0.9 % IV SOLN
INTRAVENOUS | Status: DC
Start: 2023-03-31 — End: 2023-03-31

## 2023-03-31 MED ORDER — HEPARIN SODIUM (PORCINE) 1000 UNIT/ML IJ SOLN
INTRAMUSCULAR | Status: DC | PRN
  Administered 2023-03-31: 3000 [IU] via INTRAVENOUS

## 2023-03-31 MED ORDER — HEPARIN SODIUM (PORCINE) 1000 UNIT/ML IJ SOLN
INTRAMUSCULAR | Status: AC
  Filled 2023-03-31: qty 10

## 2023-03-31 MED ORDER — ONDANSETRON HCL 4 MG/2ML IJ SOLN
4.0000 mg | Freq: Four times a day (QID) | INTRAMUSCULAR | Status: DC | PRN
  Administered 2023-03-31: 4 mg via INTRAVENOUS

## 2023-03-31 MED ORDER — PHENYLEPHRINE 80 MCG/ML (10ML) SYRINGE FOR IV PUSH (FOR BLOOD PRESSURE SUPPORT)
PREFILLED_SYRINGE | INTRAVENOUS | Status: DC | PRN
  Administered 2023-03-31: 80 ug via INTRAVENOUS
  Administered 2023-03-31: 160 ug via INTRAVENOUS
  Administered 2023-03-31: 80 ug via INTRAVENOUS
  Administered 2023-03-31: 160 ug via INTRAVENOUS
  Administered 2023-03-31: 80 ug via INTRAVENOUS
  Administered 2023-03-31: 160 ug via INTRAVENOUS
  Administered 2023-03-31: 80 ug via INTRAVENOUS

## 2023-03-31 MED ORDER — PROPOFOL 1000 MG/100ML IV EMUL
INTRAVENOUS | Status: AC
  Filled 2023-03-31: qty 100

## 2023-03-31 MED ORDER — MIDAZOLAM HCL 2 MG/ML PO SYRP: 8.0000 mg | ORAL_SOLUTION | Freq: Once | ORAL | Status: DC | PRN

## 2023-03-31 MED ORDER — PROPOFOL 500 MG/50ML IV EMUL
INTRAVENOUS | Status: DC | PRN
  Administered 2023-03-31: 30 mg via INTRAVENOUS
  Administered 2023-03-31: 100 ug/kg/min via INTRAVENOUS

## 2023-03-31 MED ORDER — LIDOCAINE HCL (PF) 2 % IJ SOLN
INTRAMUSCULAR | Status: AC
  Filled 2023-03-31: qty 5

## 2023-03-31 MED ORDER — DEXMEDETOMIDINE HCL IN NACL 80 MCG/20ML IV SOLN
INTRAVENOUS | Status: DC | PRN
  Administered 2023-03-31 (×2): 4 ug via INTRAVENOUS
  Administered 2023-03-31: 8 ug via INTRAVENOUS

## 2023-03-31 SURGICAL SUPPLY — 27 items
CATH BEACON 5 .035 65 KMP TIP (CATHETERS) IMPLANT
CATH BEACON 5 .038 100 VERT TP (CATHETERS) IMPLANT
CATH VS15FR (CATHETERS) IMPLANT
CLOSURE PERCLOSE PROSTYLE (VASCULAR PRODUCTS) IMPLANT
COVER DRAPE FLUORO 36X44 (DRAPES) IMPLANT
COVER PROBE ULTRASOUND 5X96 (MISCELLANEOUS) IMPLANT
DERMABOND ADVANCED .7 DNX12 (GAUZE/BANDAGES/DRESSINGS) IMPLANT
DEVICE TORQUE (MISCELLANEOUS) IMPLANT
DRAPE BRACHIAL (DRAPES) IMPLANT
GUIDEWIRE ANGLED .035X260CM (WIRE) IMPLANT
KIT CAROTID MANIFOLD (MISCELLANEOUS) IMPLANT
KIT SNARE RETRIEVAL 3-LOOP (MISCELLANEOUS) IMPLANT
NDL ENTRY 21GA 7CM ECHOTIP (NEEDLE) IMPLANT
NEEDLE ENTRY 21GA 7CM ECHOTIP (NEEDLE) ×1
PACK ANGIOGRAPHY (CUSTOM PROCEDURE TRAY) ×1 IMPLANT
PROTECTION STATION PRESSURIZED (MISCELLANEOUS) ×1
SET INTRO CAPELLA COAXIAL (SET/KITS/TRAYS/PACK) IMPLANT
SHEATH BRITE TIP 5FRX11 (SHEATH) IMPLANT
SHEATH BRITE TIP 8FRX11 (SHEATH) IMPLANT
SHEATH DRYSEAL FLEX 16FR 33CM (SHEATH) IMPLANT
STATION PROTECTION PRESSURIZED (MISCELLANEOUS) IMPLANT
STOPCOCK 3WAY MALE LL (IV SETS) IMPLANT
SUT MNCRL AB 4-0 PS2 18 (SUTURE) IMPLANT
TUBING CONTRAST HIGH PRESS 72 (TUBING) IMPLANT
WIRE GUIDERIGHT .035X150 (WIRE) IMPLANT
WIRE SUPRACORE 190CM (WIRE) IMPLANT
WIRE SUPRACORE 300CM (WIRE) IMPLANT

## 2023-03-31 NOTE — Discharge Instructions (Signed)
Inferior Vena Cava Filter Removal, Care After This sheet gives you information about how to care for yourself after your procedure. Your health care provider may also give you more specific instructions. If you have problems or questions, contact your health care provider. What can I expect after the procedure? After your procedure, it is common to have: Mild pain in the area where the filter was removed. Mild bruising in the area where the filter was removed.  Follow these instructions at home: Removal  site care Follow instructions from your health care provider about how to take care of the site where a catheter was removed at your neck.. Make sure you: Wash your hands with soap and water before you change your bandage (dressing). If soap and water are not available, use hand sanitizer. Leave bandage on for 48hrs.. Check your removal site every day for signs of infection. Check for: More redness, swelling, or pain. More fluid or blood. Warmth. Pus or a bad smell. Keep the removal site clean and dry. You may shower tomorrow, do not scrub or rub over the dressing site. The dressing is called "dermabond", it is like a human-grade super glue, that will eventually flake off on it's own, please do not pick or scrub it.  General instructions Take over-the-counter and prescription medicines only as told by your health care provider. Avoid heavy lifting or hard activities for 48 hours after the procedure or as told by your health care provider. Do not drive for 24 hours if you were given a a medicine to help you relax (sedative). Do not drive or use heavy machinery while taking prescription pain medicine. Do not go back to school or work until your health care provider approves. Keep all follow-up visits as told by your health care provider. This is important. Contact a health care provider if: You have more redness, swelling, or pain around your removal site. You have more fluid or blood  coming from you removalr site. Your removal site feels warm to the touch. You have pus or a bad smell coming from your  removal site. You have a fever. You are dizzy. You have nausea and vomiting. You develop a rash. Get help right away if: You develop chest pain, a cough, or difficulty breathing. You develop shortness of breath, feel faint, or pass out. You cough up blood. You have severe pain in your abdomen. You develop swelling and discoloration or pain in your legs. Your legs become pale and cold or blue. You develop weakness, difficulty moving your arms or legs, or balance problems. You develop problems with speech or vision. These symptoms may represent a serious problem that is an emergency. Do not wait to see if the symptoms will go away. Get medical help right away. Call your local emergency services (911 in the [place] Do not drive yourself to the hospital. Summary

## 2023-03-31 NOTE — Anesthesia Preprocedure Evaluation (Signed)
Anesthesia Evaluation  Patient identified by MRN, date of birth, ID band Patient awake    Reviewed: Allergy & Precautions, NPO status , Patient's Chart, lab work & pertinent test results  History of Anesthesia Complications Negative for: history of anesthetic complications  Airway Mallampati: IV   Neck ROM: Full    Dental no notable dental hx.    Pulmonary neg pulmonary ROS   Pulmonary exam normal breath sounds clear to auscultation       Cardiovascular hypertension, + CAD (s/p MI and stents)  Normal cardiovascular exam Rhythm:Regular Rate:Normal  Hx PE 2016 s/p IVC filter, on ASA only  ECG 12/22/22: NSR  Echo 06/03/22:    1. The left ventricle is normal in size with mildly increased wall thickness.  2. The left ventricular systolic function is normal, LVEF is visually estimated at 60-65%.  3. The aortic valve is trileaflet with mildly thickened leaflets with normal excursion.  4. There is mild aortic regurgitation.  5. The right ventricle is normal in size, with normal systolic function.     Neuro/Psych Vertigo     GI/Hepatic negative GI ROS,,,  Endo/Other  negative endocrine ROS    Renal/GU Renal disease (nephrolithiasis)     Musculoskeletal  (+) Arthritis ,    Abdominal   Peds  Hematology  (+) Blood dyscrasia, anemia   Anesthesia Other Findings Cardiology note 11/03/22:  1. Preoperative cardiovascular risk assessment.  Closed reduction nasal fracture by Dr. Willeen Cass.   Chart reviewed as part of pre-operative protocol coverage. According to the RCRI, patient has a 0.9% risk of MACE. Patient reports activity equivalent to >4.0 METS (volunteers with [employer], walks daily).    Given past medical history and time since last visit, based on ACC/AHA guidelines, April [name] would be at acceptable risk for the planned procedure without further cardiovascular testing.    Patient was advised that if she  develops new symptoms prior to surgery to contact our office to arrange a follow-up appointment.  she verbalized understanding.   Reproductive/Obstetrics                              Anesthesia Physical Anesthesia Plan  ASA: 3  Anesthesia Plan: General   Post-op Pain Management:    Induction: Intravenous  PONV Risk Score and Plan: 3 and Propofol infusion, TIVA, Treatment may vary due to age or medical condition and Ondansetron  Airway Management Planned: Natural Airway  Additional Equipment:   Intra-op Plan:   Post-operative Plan:   Informed Consent: I have reviewed the patients History and Physical, chart, labs and discussed the procedure including the risks, benefits and alternatives for the proposed anesthesia with the patient or authorized representative who has indicated his/her understanding and acceptance.       Plan Discussed with: CRNA  Anesthesia Plan Comments: (LMA/GETA backup discussed.  Patient consented for risks of anesthesia including but not limited to:  - adverse reactions to medications - damage to eyes, teeth, lips or other oral mucosa - nerve damage due to positioning  - sore throat or hoarseness - damage to heart, brain, nerves, lungs, other parts of body or loss of life  Informed patient about role of CRNA in peri- and intra-operative care.  Patient voiced understanding.)         Anesthesia Quick Evaluation

## 2023-03-31 NOTE — Anesthesia Postprocedure Evaluation (Signed)
Anesthesia Post Note  Patient: April [name]  Procedure(s) Performed: IVC FILTER REMOVAL  Patient location during evaluation: PACU Anesthesia Type: General Level of consciousness: awake and alert, oriented and patient cooperative Pain management: pain level controlled Vital Signs Assessment: post-procedure vital signs reviewed and stable Respiratory status: spontaneous breathing, nonlabored ventilation and respiratory function stable Cardiovascular status: blood pressure returned to baseline and stable Postop Assessment: adequate PO intake Anesthetic complications: no   No notable events documented.   Last Vitals:  Vitals:   03/31/23 0956 03/31/23 1015  BP: 123/67 129/68  Pulse: 88 83  Resp:  (!) 22  Temp:    SpO2: 97% 94%    Last Pain:  Vitals:   03/31/23 1015  TempSrc:   PainSc: 0-No pain                 Reed Breech

## 2023-03-31 NOTE — Transfer of Care (Signed)
Immediate Anesthesia Transfer of Care Note  Patient: Deone Ciolino  Procedure(s) Performed: IVC FILTER REMOVAL  Patient Location: PACU  Anesthesia Type:General  Level of Consciousness: awake, alert , and oriented  Airway & Oxygen Therapy: Patient Spontanous Breathing  Post-op Assessment: Report given to RN and Post -op Vital signs reviewed and stable  Post vital signs: Reviewed and stable  Last Vitals:  Vitals Value Taken Time  BP 123/67 03/31/23 0956  Temp    Pulse 88 03/31/23 0956  Resp 5 03/31/23 0956  SpO2 98 % 03/31/23 0956  Vitals shown include unfiled device data.  Last Pain:  Vitals:   03/31/23 0956  TempSrc:   PainSc: 0-No pain         Complications: No notable events documented.

## 2023-03-31 NOTE — Interval H&P Note (Signed)
History and Physical Interval Note:  03/31/2023 10:08 AM  April [name]  has presented today for surgery, with the diagnosis of IVC filter removal    ANESTHESIA    DVT.  The various methods of treatment have been discussed with the patient and family. After consideration of risks, benefits and other options for treatment, the patient has consented to  Procedure(s): IVC FILTER REMOVAL (N/A) as a surgical intervention.  The patient's history has been reviewed, patient examined, no change in status, stable for surgery.  I have reviewed the patient's chart and labs.  Questions were answered to the patient's satisfaction.     [name]

## 2023-03-31 NOTE — Op Note (Signed)
OPERATIVE NOTE   PRE-OPERATIVE DIAGNOSIS:   History of PE History of IVC filter insertion prior to total joint replacement surgery  POST-OPERATIVE DIAGNOSIS: Same  PROCEDURE: Retrieval of IVC Filter Inferior Vena Cavagram  SURGEON: Renford Dills, M.D.  ANESTHESIA: General by anesthesia  ESTIMATED BLOOD LOSS: Minimal cc  FINDING(S):inferior vena cava is widely patent filter is tilted posteriorly. Filter is removed without incident  SPECIMEN(S):  IVC filter intact  INDICATIONS:   April [name] is a 77 y.o. female who presents with DVT and PE. The patient has now tolerated anticoagulation for several months. Therefore, the IVC filter is recommended to be removed. The risks and benefits were reviewed with the patient all questions were answered and they agreed to proceed with IVC filter retrieval. Oral anticoagulation will be continued.  DESCRIPTION: After obtaining full informed written consent, the patient was brought back to the Special Procedure Suite and placed in the supine position.  The patient received IV antibiotics prior to induction.  After obtaining adequate sedation, the patient was prepped and draped in the standard fashion and appropriate time out is called.     Ultrasound was placed in a sterile sleeve.The right neck was then imaged with ultrasound.   Jugular vein was identified it is echolucent and homogeneous indicating patency. 1% lidocaine is then infiltrated under ultrasound visualization and subsequently a Seldinger needle is inserted under real-time ultrasound guidance.  J-wire is then advanced into the inferior vena cava under fluoroscopic guidance.  Initially a 5 French sheath was placed and then a Perclose device was utilized in a preclose fashion.  Next the standard argon filter retrieval sheath was advanced over the wire under fluoroscopic guidance and positioned with the tip of the sheath at the confluence of the iliac veins inferior vena caval  imaging is performed.  After review of the image the sheath is repositioned to above the filter and the snare is introduced. Snare is opened but I was again unable to loop the hook itself.  A 16 French dry seal sheath was then advanced over the wire under fluoroscopic guidance and positioned with its tip in the inferior vena cava.  Using a V S1 catheter and a floppy Glidewire I was able to loop the superior aspect of the filter just below the level of the hook.  I then was able to snare the wire and bring the Glidewire out through the dry seal sheath.  Traction was then placed on both wires simultaneously in preparation to advance the snare over the wires directly onto the hook.  This maneuver appeared to separate the filter itself from the wall of the cava and position the filter in a more upright position.  The Glidewire was then removed and the filter retrieval system advanced through the dry seal sheath.  I was unable to capture the filter using the trilobed snare.  The hook is secured without difficulty. The filter is then collapsed within the sheath and removed without difficulty.  Filter is examined on the back table and found to be intact.  The retrieval outer sheath is then advanced to the confluence of the iliac veins and hand-injection of contrast is utilized to demonstrate the inferior vena cava which is widely patent and intact.  Dry seal 16 French sheath is removed and the Perclose device secured.  Monocryl was used to close the skin and light pressure is held the patient tolerated the procedure well and there were no immediate complications.  Interpretation: inferior vena cava is  widely patent filter is in place in good position. Filter is removed without incident.     COMPLICATIONS: None  CONDITION: April [name], M.D.  [facility] Office: [phone]   03/31/2023, 10:10 AM

## 2023-03-31 NOTE — Anesthesia Procedure Notes (Signed)
Procedure Name: MAC Date/Time: 03/31/2023 8:00 AM  Performed by: Hezzie Bump, CRNAPre-anesthesia Checklist: Patient identified, Emergency Drugs available, Suction available and Patient being monitored Patient Re-evaluated:Patient Re-evaluated prior to induction Oxygen Delivery Method: Simple face mask Induction Type: IV induction Placement Confirmation: positive ETCO2

## 2023-04-02 ENCOUNTER — Encounter: Payer: Self-pay | Admitting: Vascular Surgery

## 2023-04-09 NOTE — Op Note (Signed)
Patient did not have the IVC filter removed on this date.  She was canceled and rescheduled

## 2023-04-10 ENCOUNTER — Encounter: Payer: Self-pay | Admitting: Vascular Surgery

## 2023-04-13 ENCOUNTER — Ambulatory Visit
Admission: RE | Admit: 2023-04-13 | Discharge: 2023-04-13 | Disposition: A | Discharge status: Home / Self Care | Payer: HMO | Source: Ambulatory Visit | Attending: Obstetrics and Gynecology | Admitting: Obstetrics and Gynecology

## 2023-04-13 DIAGNOSIS — Z1231 Encounter for screening mammogram for malignant neoplasm of breast: Secondary | ICD-10-CM | POA: Insufficient documentation

## 2023-04-24 ENCOUNTER — Encounter: Payer: Self-pay | Admitting: Primary Care

## 2023-04-28 ENCOUNTER — Telehealth: Payer: Self-pay | Admitting: Primary Care

## 2023-04-28 NOTE — Telephone Encounter (Signed)
ONO ON ROOM AIR 01/29/23 show patient does not need supplemental oxygen at night.  SpO2 low 88%, baseline oxygen level 95%.  She spent 20 seconds with O2 level less than 88%. No changes recommended

## 2023-04-30 NOTE — Telephone Encounter (Signed)
 Attempted to call pt x1. No answer. Lvm to return call. Sent mychart message as well.

## 2023-04-30 NOTE — Telephone Encounter (Signed)
 I called pt in regards to ONO results. I verbally stated to the pt that the results were WNL. Pt verbalized understanding. Nothing further needed.

## 2023-05-09 NOTE — Progress Notes (Signed)
 MRN : [id number]  April [name] is a 78 y.o. ([birth year]) female who presents with chief complaint of legs hurt and swell.  History of Present Illness:   The patient presents to the office for follow up s/p IVC filter removal on 03/31/2023.  She denies new complaints.  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  Joint replacement was without complication.  IVC filter was placed approximately one week prior to joint surgery.     The patient notes their leg is not painful.  Her mild symptoms are better with elevation.  The patient notes minimal edema in the morning which worsens throughout the day.     The patient has been using compression therapy at this point.   No SOB or pleuritic chest pains.  No cough or hemoptysis.   No blood per rectum or blood in any sputum.  No excessive bruising per the patient.  No new ulcers or wounds of the lower extremities have occurred.   No outpatient medications have been marked as taking for the 05/11/23 encounter (Appointment) with Jama, Cordella MATSU, MD.    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  Evalene Reusing, MD   Pulmonary embolism (HCC) 2016   Pulmonary embolism (HCC) 2016   Syncope    Vertigo 1990   inner ear    Past Surgical History:  Procedure Laterality Date   ABDOMINAL HYSTERECTOMY     APPENDECTOMY     BREAST BIOPSY Right 04/30/2020   11:00 5cmfn Qmarker-benign, 11:00 8 cmfn twisted X marker-benign   CARDIAC CATHETERIZATION  2005   CATARACT EXTRACTION W/PHACO Left 02/08/2019   Procedure: CATARACT EXTRACTION  PHACO AND INTRAOCULAR LENS PLACEMENT (IOC) LEFT 00:40.0  20.6%  8.26;  Surgeon: Jaye Fallow, MD;  Location: [facility];  Service: Ophthalmology;  Laterality: Left;   CATARACT EXTRACTION W/PHACO Right 04/26/2019   Procedure: CATARACT EXTRACTION PHACO AND INTRAOCULAR LENS PLACEMENT (IOC) RIGHT 6.83 00:46.0;  Surgeon: Jaye Fallow, MD;  Location: [facility];  Service: Ophthalmology;  Laterality: Right;   CHOLECYSTECTOMY     CLOSED REDUCTION NASAL FRACTURE Bilateral 11/05/2022   Procedure: CLOSED REDUCTION NASAL FRACTURE;  Surgeon: Blair Mt, MD;  Location: [facility];  Service: ENT;  Laterality: Bilateral;   COLONOSCOPY  2017   CORONARY ANGIOPLASTY WITH STENT PLACEMENT     FOOT SURGERY Right    IVC FILTER INSERTION N/A 07/08/2022   Procedure: IVC FILTER INSERTION;  Surgeon: Jama Cordella MATSU, MD;  Location: [facility];  Service: Cardiovascular;  Laterality: N/A;   IVC FILTER REMOVAL N/A 09/23/2022   Procedure: IVC FILTER REMOVAL;  Surgeon: Jama Cordella MATSU, MD;  Location: [facility];  Service: Cardiovascular;  Laterality: N/A;   IVC FILTER REMOVAL N/A 03/31/2023   Procedure: IVC FILTER REMOVAL;  Surgeon: Jama Cordella MATSU, MD;  Location: [facility];  Service: Cardiovascular;  Laterality: N/A;   TOTAL  KNEE ARTHROPLASTY Right 07/21/2022   Procedure: TOTAL KNEE ARTHROPLASTY;  Surgeon: Lorelle Hussar, MD;  Location: [facility];  Service: Orthopedics;  Laterality: Right;    Social History Social History   Tobacco Use   Smoking status: Never   Smokeless tobacco: Never  Vaping Use   Vaping status: Never Used  Substance Use Topics   Alcohol use: No   Drug use: No    Family History Family History  Problem Relation Age of Onset   Stroke Mother    CAD Father    Colon cancer Other        paternal great grandfather   Breast cancer Neg Hx     Allergies  Allergen Reactions   Antihistamines, Chlorpheniramine-Type Other (See  Comments)    think crazy thoughts   Atenolol  Other (See Comments)    Avoid AVN affecting medications due to bradycardia/syncope   Ciprofloxacin Other (See Comments)    Patient cannot remember reaction   Dristan Cold [Chlorphen-Pe-Acetaminophen ] Other (See Comments)    think crazy thoughts   Ginger Diarrhea   Nitrofurantoin Nausea Only   Celecoxib  Rash     REVIEW OF SYSTEMS (Negative unless checked)  Constitutional: [] Weight loss  [] Fever  [] Chills Cardiac: [] Chest pain   [] Chest pressure   [] Palpitations   [] Shortness of breath when laying flat   [] Shortness of breath with exertion. Vascular:  [] Pain in legs with walking   [x] Pain in legs at rest  [] History of DVT   [] Phlebitis   [x] Swelling in legs   [] Varicose veins   [] Non-healing ulcers Pulmonary:   [] Uses home oxygen   [] Productive cough   [] Hemoptysis   [] Wheeze  [] COPD   [] Asthma Neurologic:  [] Dizziness   [] Seizures   [] History of stroke   [] History of TIA  [] Aphasia   [] Vissual changes   [] Weakness or numbness in arm   [] Weakness or numbness in leg Musculoskeletal:   [] Joint swelling   [] Joint pain   [] Low back pain Hematologic:  [] Easy bruising  [] Easy bleeding   [] Hypercoagulable state   [] Anemic Gastrointestinal:  [] Diarrhea   [] Vomiting  [] Gastroesophageal reflux/heartburn   [] Difficulty swallowing. Genitourinary:  [] Chronic kidney disease   [] Difficult urination  [] Frequent urination   [] Blood in urine Skin:  [] Rashes   [] Ulcers  Psychological:  [] History of anxiety   []  History of major depression.  Physical Examination  There were no vitals filed for this visit. There is no height or weight on file to calculate BMI. Gen: WD/WN, NAD Head: [place]/AT, No temporalis wasting.  Ear/Nose/Throat: Hearing grossly intact, nares w/o erythema or drainage, pinna without lesions Eyes: PER, EOMI, sclera nonicteric.  Neck: Supple, no gross masses.  No JVD.  Pulmonary:  Good air movement, no audible wheezing, no use of accessory  muscles.  Cardiac: RRR, precordium not hyperdynamic. Vascular:  scattered varicosities present bilaterally.  Moderate venous stasis changes to the legs bilaterally.  2+ soft pitting edema. CEAP [id number]   Vessel Right Left  Radial Palpable Palpable  Gastrointestinal: soft, non-distended. No guarding/no peritoneal signs.  Musculoskeletal: M/S 5/5 throughout.  No deformity.  Neurologic: CN 2-12 intact. Pain and light touch intact in extremities.  Symmetrical.  Speech is fluent. Motor exam as listed above. Psychiatric: Judgment intact, Mood & affect appropriate for pt's clinical situation. Dermatologic: Venous rashes no ulcers noted.  No changes consistent with cellulitis. Lymph : No lichenification or skin changes of chronic lymphedema.  CBC Lab Results  Component Value Date   WBC 4.7 10/24/2022   HGB  10.4 (L) 10/24/2022   HCT 31.7 (L) 10/24/2022   MCV 92.4 10/24/2022   PLT 206 10/24/2022    BMET    Component Value Date/Time   NA 138 12/04/2022 0910   NA 143 12/02/2022 0945   K 3.6 12/04/2022 0910   CL 105 12/04/2022 0910   CO2 23 12/04/2022 0910   GLUCOSE 146 (H) 12/04/2022 0910   BUN 22 12/04/2022 0910   BUN 29 (H) 12/02/2022 0945   CREATININE 0.86 12/04/2022 0910   CALCIUM  9.1 12/04/2022 0910   GFRNONAA >60 12/04/2022 0910   GFRAA >60 04/16/2015 0532   CrCl cannot be calculated (Patient's most recent lab result is older than the maximum 21 days allowed.).  COAG No results found for: INR, PROTIME  Radiology MM 3D SCREENING MAMMOGRAM BILATERAL BREAST Result Date: 04/14/2023 CLINICAL DATA:  Screening. EXAM: DIGITAL SCREENING BILATERAL MAMMOGRAM WITH TOMOSYNTHESIS AND CAD TECHNIQUE: Bilateral screening digital craniocaudal and mediolateral oblique mammograms were obtained. Bilateral screening digital breast tomosynthesis was performed. The images were evaluated with computer-aided detection. COMPARISON:  Previous exam(s). ACR Breast Density Category b: There are  scattered areas of fibroglandular density. FINDINGS: There are no findings suspicious for malignancy. IMPRESSION: No mammographic evidence of malignancy. A result letter of this screening mammogram will be mailed directly to the patient. RECOMMENDATION: Screening mammogram in one year. (Code:SM-B-01Y) BI-RADS CATEGORY  1: Negative. Electronically Signed   By: Toribio Agreste M.D.   On: 04/14/2023 12:52     Assessment/Plan 1. History of pulmonary embolus (PE) (Primary) The patient has done well status post joint replacement surgery.  Her IVC filter has been removed and she has done well.  Patient will continue to elevate to minimize swelling.  Given the history of DVT and the possibility of post phlebitic changes such as swelling and discomfort the patient should wear graduated compression stockings.  The compression should be worn on a daily basis.  In addition, behavioral modification including elevation during the day and avoidance of prolonged dependency is helpful.    The patient will follow-up with me PRN.  2. S/P TKR (total knee replacement) using cement, right See #1  3. Mixed hyperlipidemia Continue statin as ordered and reviewed, no changes at this time  4. Coronary artery disease involving native coronary artery of native heart with angina pectoris (HCC) Continue cardiac and antihypertensive medications as already ordered and reviewed, no changes at this time.  Continue statin as ordered and reviewed, no changes at this time  Nitrates PRN for chest pain  5. Essential hypertension Continue antihypertensive medications as already ordered, these medications have been reviewed and there are no changes at this time.    Cordella Shawl, MD  05/09/2023 12:48 PM

## 2023-05-11 ENCOUNTER — Encounter (INDEPENDENT_AMBULATORY_CARE_PROVIDER_SITE_OTHER): Payer: Self-pay | Admitting: Vascular Surgery

## 2023-05-11 ENCOUNTER — Ambulatory Visit (INDEPENDENT_AMBULATORY_CARE_PROVIDER_SITE_OTHER): Payer: HMO | Admitting: Vascular Surgery

## 2023-05-11 VITALS — BP 139/80 | HR 67 | Resp 18 | Ht 67.0 in | Wt 189.9 lb

## 2023-05-11 DIAGNOSIS — I10 Essential (primary) hypertension: Secondary | ICD-10-CM | POA: Diagnosis not present

## 2023-05-11 DIAGNOSIS — E782 Mixed hyperlipidemia: Secondary | ICD-10-CM | POA: Diagnosis not present

## 2023-05-11 DIAGNOSIS — Z86711 Personal history of pulmonary embolism: Secondary | ICD-10-CM | POA: Diagnosis not present

## 2023-05-11 DIAGNOSIS — Z96651 Presence of right artificial knee joint: Secondary | ICD-10-CM | POA: Diagnosis not present

## 2023-05-11 DIAGNOSIS — I25119 Atherosclerotic heart disease of native coronary artery with unspecified angina pectoris: Secondary | ICD-10-CM | POA: Diagnosis not present

## 2023-06-10 DIAGNOSIS — M129 Arthropathy, unspecified: Secondary | ICD-10-CM | POA: Diagnosis not present

## 2023-06-10 DIAGNOSIS — Z889 Allergy status to unspecified drugs, medicaments and biological substances status: Secondary | ICD-10-CM | POA: Diagnosis not present

## 2023-06-10 DIAGNOSIS — I2510 Atherosclerotic heart disease of native coronary artery without angina pectoris: Secondary | ICD-10-CM | POA: Diagnosis not present

## 2023-06-10 DIAGNOSIS — D6852 Prothrombin gene mutation: Secondary | ICD-10-CM | POA: Diagnosis not present

## 2023-06-10 DIAGNOSIS — N1831 Chronic kidney disease, stage 3a: Secondary | ICD-10-CM | POA: Diagnosis not present

## 2023-06-10 DIAGNOSIS — E7800 Pure hypercholesterolemia, unspecified: Secondary | ICD-10-CM | POA: Diagnosis not present

## 2023-06-10 DIAGNOSIS — R7302 Impaired glucose tolerance (oral): Secondary | ICD-10-CM | POA: Diagnosis not present

## 2023-06-10 DIAGNOSIS — I10 Essential (primary) hypertension: Secondary | ICD-10-CM | POA: Diagnosis not present

## 2023-06-17 ENCOUNTER — Encounter: Payer: Self-pay | Admitting: Internal Medicine

## 2023-06-17 ENCOUNTER — Ambulatory Visit: Payer: HMO | Attending: Internal Medicine | Admitting: Internal Medicine

## 2023-06-17 VITALS — BP 128/68 | HR 60 | Ht 67.0 in | Wt 187.0 lb

## 2023-06-17 DIAGNOSIS — I2510 Atherosclerotic heart disease of native coronary artery without angina pectoris: Secondary | ICD-10-CM

## 2023-06-17 DIAGNOSIS — R55 Syncope and collapse: Secondary | ICD-10-CM

## 2023-06-17 DIAGNOSIS — I10 Essential (primary) hypertension: Secondary | ICD-10-CM

## 2023-06-17 DIAGNOSIS — R001 Bradycardia, unspecified: Secondary | ICD-10-CM | POA: Diagnosis not present

## 2023-06-17 NOTE — Patient Instructions (Signed)
 Medication Instructions:  Your physician recommends that you continue on your current medications as directed. Please refer to the Current Medication list given to you today.   *If you need a refill on your cardiac medications before your next appointment, please call your pharmacy*   Lab Work: No labs ordered today    Testing/Procedures: No test ordered today    Follow-Up: At [facility], you and your health needs are our priority.  As part of our continuing mission to provide you with exceptional heart care, we have created designated Provider Care Teams.  These Care Teams include your primary Cardiologist (physician) and Advanced Practice Providers (APPs -  Physician Assistants and Nurse Practitioners) who all work together to provide you with the care you need, when you need it.  We recommend signing up for the patient portal called "MyChart".  Sign up information is provided on this After Visit Summary.  MyChart is used to connect with patients for Virtual Visits (Telemedicine).  Patients are able to view lab/test results, encounter notes, upcoming appointments, etc.  Non-urgent messages can be sent to your provider as well.   To learn more about what you can do with MyChart, go to [email or web address].    Your next appointment:   1 year(s)  Provider:   You may see Yvonne Kendall, MD or one of the following Advanced Practice Providers on your designated Care Team:   Nicolasa Ducking, NP Eula Listen, PA-C Cadence Fransico Michael, PA-C Charlsie Quest, NP Carlos Levering, NP

## 2023-06-17 NOTE — Progress Notes (Unsigned)
  Cardiology Office Note:  .   Date:  06/18/2023  ID:  Sanaz Scarlett, DOB 1945/09/04, MRN [id number] PCP: Marina Goodell, MD  [facility] Providers Cardiologist:  Yvonne Kendall, MD     History of Present Illness: .   April [name] is a 78 y.o. female with history of CAD s/p PCI (2006), HTN, prior provoked PE s/p IVC filter with unsuccessful removal, and syncope, presenting for follow-up of CAD and syncope, who presents for follow-up of CAD and vasovagal syncope.  I last saw her in 01/2023, which time she was doing well from a heart standpoint.  She underwent successful IVC filter retrieval in December.  Today, she reports feeling well though she notes that her heart rate speeds up quite a bit with physical activity.  She has not had any palpitations at rest.  She has not had any further vasovagal events in at least 3 to 4 months.  She denies chest pain, shortness of breath, and lightheadedness.  She is not exercising regularly.  Home blood pressures have been fairly normal.  ROS: See HPI  Studies Reviewed: Marland Kitchen   EKG Interpretation Date/Time:  Wednesday June 17 2023 10:33:37 EST Ventricular Rate:  60 PR Interval:  164 QRS Duration:  76 QT Interval:  430 QTC Calculation: 430 R Axis:   -12  Text Interpretation: Normal sinus rhythm Minimal voltage criteria for LVH, may be normal variant ( R in aVL ) Inferior infarct (cited on or before 17-Jun-2023) When compared with ECG of 22-Dec-2022 10:01, Criteria for Septal infarct are no longer Present Confirmed by Jahnavi Muratore [phone]) on 06/18/2023 9:37:43 AM    Event monitor (12/04/2022): Predominantly sinus rhythm with 29 episodes of PSVT lasting up to 12.7 seconds and single brief NSVT episode.  No sustained arrhythmia or prolonged pause identified to explain syncopal episode.  Risk Assessment/Calculations:             Physical Exam:   VS:  BP 128/68 (BP Location: Left Arm, Patient Position: Sitting, Cuff Size:  Normal)   Pulse 60   Ht 5\' 7"  (1.702 m)   Wt 187 lb (84.8 kg)   SpO2 97%   BMI 29.29 kg/m    Wt Readings from Last 3 Encounters:  06/17/23 187 lb (84.8 kg)  05/11/23 189 lb 14.4 oz (86.1 kg)  03/31/23 187 lb 6.3 oz (85 kg)    General:  NAD.  Accompanied by her husband. Neck: No JVD or HJR. Lungs: Clear to auscultation bilaterally without wheezes or crackles. Heart: Regular rate and rhythm without murmurs, rubs, or gallops. Abdomen: Soft, nontender, nondistended. Extremities: No lower extremity edema.  ASSESSMENT AND PLAN: .    Vasovagal syncope and bradycardia: No further episodes reported.  EP evaluation completed.  Continue avoidance of beta-blockers and nondihydropyridine calcium channel blockers in the setting of nocturnal bradycardia.  Coronary artery disease: No angina reported.  Continue aspirin and atorvastatin for secondary prevention.  Hypertension: Blood pressure well-controlled today in the office and at home.  Continue losartan 25 mg daily.    Dispo: Return to clinic in 1 year.  Signed, Yvonne Kendall, MD

## 2023-06-18 ENCOUNTER — Encounter: Payer: Self-pay | Admitting: Internal Medicine

## 2023-06-30 DIAGNOSIS — R9431 Abnormal electrocardiogram [ECG] [EKG]: Secondary | ICD-10-CM | POA: Diagnosis not present

## 2023-06-30 DIAGNOSIS — Z86711 Personal history of pulmonary embolism: Secondary | ICD-10-CM | POA: Diagnosis not present

## 2023-06-30 DIAGNOSIS — E785 Hyperlipidemia, unspecified: Secondary | ICD-10-CM | POA: Diagnosis not present

## 2023-06-30 DIAGNOSIS — R001 Bradycardia, unspecified: Secondary | ICD-10-CM | POA: Diagnosis not present

## 2023-06-30 DIAGNOSIS — I2510 Atherosclerotic heart disease of native coronary artery without angina pectoris: Secondary | ICD-10-CM | POA: Diagnosis not present

## 2023-06-30 DIAGNOSIS — I252 Old myocardial infarction: Secondary | ICD-10-CM | POA: Diagnosis not present

## 2023-06-30 DIAGNOSIS — Z7982 Long term (current) use of aspirin: Secondary | ICD-10-CM | POA: Diagnosis not present

## 2023-06-30 DIAGNOSIS — I10 Essential (primary) hypertension: Secondary | ICD-10-CM | POA: Diagnosis not present

## 2023-07-20 ENCOUNTER — Other Ambulatory Visit: Payer: Self-pay | Admitting: Cardiology

## 2023-07-21 DIAGNOSIS — Z96651 Presence of right artificial knee joint: Secondary | ICD-10-CM | POA: Diagnosis not present

## 2023-09-15 ENCOUNTER — Encounter (INDEPENDENT_AMBULATORY_CARE_PROVIDER_SITE_OTHER): Payer: Self-pay

## 2023-12-14 DIAGNOSIS — R7302 Impaired glucose tolerance (oral): Secondary | ICD-10-CM | POA: Diagnosis not present

## 2023-12-14 DIAGNOSIS — Z03818 Encounter for observation for suspected exposure to other biological agents ruled out: Secondary | ICD-10-CM | POA: Diagnosis not present

## 2023-12-14 DIAGNOSIS — N1831 Chronic kidney disease, stage 3a: Secondary | ICD-10-CM | POA: Diagnosis not present

## 2023-12-14 DIAGNOSIS — M129 Arthropathy, unspecified: Secondary | ICD-10-CM | POA: Diagnosis not present

## 2023-12-14 DIAGNOSIS — E7800 Pure hypercholesterolemia, unspecified: Secondary | ICD-10-CM | POA: Diagnosis not present

## 2023-12-14 DIAGNOSIS — I10 Essential (primary) hypertension: Secondary | ICD-10-CM | POA: Diagnosis not present

## 2023-12-14 DIAGNOSIS — U071 COVID-19: Secondary | ICD-10-CM | POA: Diagnosis not present

## 2023-12-14 DIAGNOSIS — D6852 Prothrombin gene mutation: Secondary | ICD-10-CM | POA: Diagnosis not present

## 2023-12-14 DIAGNOSIS — Z0000 Encounter for general adult medical examination without abnormal findings: Secondary | ICD-10-CM | POA: Diagnosis not present

## 2023-12-14 DIAGNOSIS — R051 Acute cough: Secondary | ICD-10-CM | POA: Diagnosis not present

## 2023-12-14 DIAGNOSIS — R52 Pain, unspecified: Secondary | ICD-10-CM | POA: Diagnosis not present

## 2023-12-14 DIAGNOSIS — I2510 Atherosclerotic heart disease of native coronary artery without angina pectoris: Secondary | ICD-10-CM | POA: Diagnosis not present

## 2023-12-14 DIAGNOSIS — Z889 Allergy status to unspecified drugs, medicaments and biological substances status: Secondary | ICD-10-CM | POA: Diagnosis not present

## 2023-12-17 DIAGNOSIS — E7800 Pure hypercholesterolemia, unspecified: Secondary | ICD-10-CM | POA: Diagnosis not present

## 2023-12-17 DIAGNOSIS — N1831 Chronic kidney disease, stage 3a: Secondary | ICD-10-CM | POA: Diagnosis not present

## 2023-12-17 DIAGNOSIS — I10 Essential (primary) hypertension: Secondary | ICD-10-CM | POA: Diagnosis not present

## 2023-12-17 DIAGNOSIS — M129 Arthropathy, unspecified: Secondary | ICD-10-CM | POA: Diagnosis not present

## 2023-12-17 DIAGNOSIS — R7302 Impaired glucose tolerance (oral): Secondary | ICD-10-CM | POA: Diagnosis not present

## 2023-12-17 DIAGNOSIS — I2510 Atherosclerotic heart disease of native coronary artery without angina pectoris: Secondary | ICD-10-CM | POA: Diagnosis not present

## 2023-12-30 ENCOUNTER — Other Ambulatory Visit: Payer: Self-pay | Admitting: Obstetrics and Gynecology

## 2023-12-30 DIAGNOSIS — H26493 Other secondary cataract, bilateral: Secondary | ICD-10-CM | POA: Diagnosis not present

## 2023-12-30 DIAGNOSIS — Z961 Presence of intraocular lens: Secondary | ICD-10-CM | POA: Diagnosis not present

## 2023-12-30 DIAGNOSIS — Z1331 Encounter for screening for depression: Secondary | ICD-10-CM | POA: Diagnosis not present

## 2023-12-30 DIAGNOSIS — M3501 Sicca syndrome with keratoconjunctivitis: Secondary | ICD-10-CM | POA: Diagnosis not present

## 2023-12-30 DIAGNOSIS — Z01411 Encounter for gynecological examination (general) (routine) with abnormal findings: Secondary | ICD-10-CM | POA: Diagnosis not present

## 2023-12-30 DIAGNOSIS — Z1211 Encounter for screening for malignant neoplasm of colon: Secondary | ICD-10-CM | POA: Diagnosis not present

## 2023-12-30 DIAGNOSIS — H43813 Vitreous degeneration, bilateral: Secondary | ICD-10-CM | POA: Diagnosis not present

## 2023-12-30 DIAGNOSIS — Z1231 Encounter for screening mammogram for malignant neoplasm of breast: Secondary | ICD-10-CM

## 2023-12-30 DIAGNOSIS — Z1272 Encounter for screening for malignant neoplasm of vagina: Secondary | ICD-10-CM | POA: Diagnosis not present

## 2024-01-05 ENCOUNTER — Other Ambulatory Visit: Payer: Self-pay | Admitting: Primary Care

## 2024-01-05 DIAGNOSIS — R001 Bradycardia, unspecified: Secondary | ICD-10-CM

## 2024-01-07 DIAGNOSIS — Z1211 Encounter for screening for malignant neoplasm of colon: Secondary | ICD-10-CM | POA: Diagnosis not present

## 2024-01-07 DIAGNOSIS — Z01411 Encounter for gynecological examination (general) (routine) with abnormal findings: Secondary | ICD-10-CM | POA: Diagnosis not present

## 2024-01-07 DIAGNOSIS — Z1331 Encounter for screening for depression: Secondary | ICD-10-CM | POA: Diagnosis not present

## 2024-04-13 ENCOUNTER — Inpatient Hospital Stay
Admission: RE | Admit: 2024-04-13 | Discharge: 2024-04-13 | Attending: Obstetrics and Gynecology | Admitting: Obstetrics and Gynecology

## 2024-04-13 DIAGNOSIS — Z1231 Encounter for screening mammogram for malignant neoplasm of breast: Secondary | ICD-10-CM | POA: Diagnosis present

## 2024-07-06 ENCOUNTER — Ambulatory Visit: Admitting: Internal Medicine
